# Patient Record
Sex: Female | Born: 1945 | Race: White | Hispanic: No | Marital: Married | Smoking: Never smoker
Health system: Southern US, Community
[De-identification: ages and names within clinical notes are randomized; demographics above are authoritative.]

## PROBLEM LIST (undated history)

## (undated) DIAGNOSIS — C4490 Unspecified malignant neoplasm of skin, unspecified: Secondary | ICD-10-CM

## (undated) DIAGNOSIS — E039 Hypothyroidism, unspecified: Secondary | ICD-10-CM

## (undated) DIAGNOSIS — E079 Disorder of thyroid, unspecified: Secondary | ICD-10-CM

## (undated) DIAGNOSIS — M1990 Unspecified osteoarthritis, unspecified site: Secondary | ICD-10-CM

## (undated) HISTORY — PX: KNEE ARTHROSCOPY: SUR90

## (undated) HISTORY — PX: CATARACT EXTRACTION W/ INTRAOCULAR LENS IMPLANT: SHX1309

## (undated) HISTORY — PX: OTHER SURGICAL HISTORY: SHX169

---

## 1997-08-04 ENCOUNTER — Other Ambulatory Visit: Admission: RE | Admit: 1997-08-04 | Discharge: 1997-08-04 | Payer: Self-pay | Admitting: Gynecology

## 1998-01-18 ENCOUNTER — Other Ambulatory Visit: Admission: RE | Admit: 1998-01-18 | Discharge: 1998-01-18 | Payer: Self-pay | Admitting: Gynecology

## 1998-07-12 ENCOUNTER — Other Ambulatory Visit: Admission: RE | Admit: 1998-07-12 | Discharge: 1998-07-12 | Payer: Self-pay | Admitting: Gynecology

## 1999-02-06 ENCOUNTER — Other Ambulatory Visit: Admission: RE | Admit: 1999-02-06 | Discharge: 1999-02-06 | Payer: Self-pay | Admitting: Gynecology

## 1999-07-31 ENCOUNTER — Other Ambulatory Visit: Admission: RE | Admit: 1999-07-31 | Discharge: 1999-07-31 | Payer: Self-pay

## 2000-04-29 ENCOUNTER — Ambulatory Visit (HOSPITAL_COMMUNITY): Admission: RE | Admit: 2000-04-29 | Discharge: 2000-04-29 | Payer: Self-pay | Admitting: Gastroenterology

## 2000-04-29 ENCOUNTER — Encounter (INDEPENDENT_AMBULATORY_CARE_PROVIDER_SITE_OTHER): Payer: Self-pay

## 2000-08-05 ENCOUNTER — Other Ambulatory Visit: Admission: RE | Admit: 2000-08-05 | Discharge: 2000-08-05 | Payer: Self-pay | Admitting: Gynecology

## 2001-08-05 ENCOUNTER — Other Ambulatory Visit: Admission: RE | Admit: 2001-08-05 | Discharge: 2001-08-05 | Payer: Self-pay | Admitting: Gynecology

## 2002-09-07 ENCOUNTER — Other Ambulatory Visit: Admission: RE | Admit: 2002-09-07 | Discharge: 2002-09-07 | Payer: Self-pay | Admitting: Gynecology

## 2003-06-28 ENCOUNTER — Ambulatory Visit (HOSPITAL_COMMUNITY): Admission: RE | Admit: 2003-06-28 | Discharge: 2003-06-28 | Payer: Self-pay | Admitting: Gastroenterology

## 2003-08-30 ENCOUNTER — Other Ambulatory Visit: Admission: RE | Admit: 2003-08-30 | Discharge: 2003-08-30 | Payer: Self-pay | Admitting: Gynecology

## 2004-08-28 ENCOUNTER — Other Ambulatory Visit: Admission: RE | Admit: 2004-08-28 | Discharge: 2004-08-28 | Payer: Self-pay | Admitting: Gynecology

## 2009-12-04 ENCOUNTER — Emergency Department (HOSPITAL_COMMUNITY): Admission: EM | Admit: 2009-12-04 | Discharge: 2009-12-04 | Payer: Self-pay | Admitting: Family Medicine

## 2010-06-02 NOTE — Op Note (Signed)
NAME:  Brandy Delgado, Brandy Delgado                           ACCOUNT NO.:  [id number]   MEDICAL RECORD NO.:  [id number]                   PATIENT TYPE:  AMB   LOCATION:  ENDO                                 FACILITY:  [facility]   PHYSICIAN:  John C. Madilyn Fireman, M.D.                 DATE OF BIRTH:  25-Jul-1945   DATE OF PROCEDURE:  06/28/2003  DATE OF DISCHARGE:                                 OPERATIVE REPORT   PROCEDURE:  Colonoscopy.   INDICATIONS FOR PROCEDURE:  Family history of colon cancer and personal  history of colon polyps.   DESCRIPTION OF PROCEDURE:  The patient was placed in the left lateral  decubitus position then placed on the pulse monitor with continuous low flow  oxygen delivered by nasal cannula. She was sedated with 62.5 mcg IV fentanyl  and 4 mg IV Versed. The Olympus video colonoscope was inserted into the  rectum and advanced to the cecum, confirmed by transillumination at  McBurney's point and visualization of the ileocecal valve and appendiceal  orifice. The prep was good. The cecum, ascending, transverse, descending and  sigmoid colon all appeared normal with no masses, polyps, diverticula or  other mucosal abnormalities. The rectum likewise appeared normal and  retroflexed view of the anus revealed no obvious internal hemorrhoids. The  scope was then withdrawn and the patient returned to the recovery room in  stable condition. The patient tolerated the procedure well and there were no  immediate complications.   IMPRESSION:  Normal colonoscopy.   PLAN:  Repeat colonoscopy in five years.                                               John C. Madilyn Fireman, M.D.    JCH/MEDQ  D:  06/28/2003  T:  06/28/2003  Job:  [id number]

## 2010-06-02 NOTE — Procedures (Signed)
[facility]  Patient:    Brandy Delgado, JUNEAU                          MRN: [id number] Proc. Date: 04/29/00 Attending:  Everardo All. Madilyn Fireman, M.D. CC:         Velora Heckler, M.D.   Procedure Report  PROCEDURE PERFORMED:  Colonoscopy.  ENDOSCOPIST:  Everardo All. Madilyn Fireman, M.D.  INDICATIONS FOR PROCEDURE:  Family history of colon cancer.  DESCRIPTION OF PROCEDURE:  The patient was placed in the left lateral decubitus position and placed on the pulse monitor with continuous low flow oxygen delivered by nasal cannula.  She was sedated with 80 mg IV Demerol and 8 mg IV Versed.  The Olympus video colonoscope was inserted into the rectum and advanced to the cecum, confirmed by transillumination of McBurneys point and visualization of the ileocecal valve and appendiceal orifice.  The prep was good.  The cecum appeared normal.  However, the ileocecal valve was somewhat prominent and had a slightly nodular appearance.  The terminal ileum was intubated and appeared normal.  I did take biopsies of the nodular area of the ileocecal valve.  The ascending colon appeared normal.  Within the transverse colon there was seen a small sessile polyp with central indentation.  This was fulgurated by hot biopsy.  The remainder of the transverse and descending colon appeared normal with no further abnormalities noted.  Within the sigmoid colon there was a 1 cm polyp which was snared and sent in a separate specimen container.  The remainder of the sigmoid and rectum appeared normal.  The colonoscope was then withdrawn and the patient returned to the recovery room in stable condition.  The patient tolerated the procedure well.  There were no immediate complications.  IMPRESSION: 1. Slight nodularity of the ileocecal valve of questionable significance. 2. Transverse and sigmoid colon polyps.  PLAN:  Await all biopsy results. DD:  04/29/00 TD:  04/29/00 Job: [id number]

## 2012-04-25 ENCOUNTER — Other Ambulatory Visit: Payer: Self-pay | Admitting: Orthopedic Surgery

## 2012-04-25 DIAGNOSIS — M25562 Pain in left knee: Secondary | ICD-10-CM

## 2012-05-06 ENCOUNTER — Ambulatory Visit
Admission: RE | Admit: 2012-05-06 | Discharge: 2012-05-06 | Disposition: A | Discharge status: Home / Self Care | Payer: BC Managed Care – PPO | Source: Ambulatory Visit | Attending: Orthopedic Surgery | Admitting: Orthopedic Surgery

## 2012-05-06 DIAGNOSIS — M25562 Pain in left knee: Secondary | ICD-10-CM

## 2013-02-11 ENCOUNTER — Encounter (HOSPITAL_COMMUNITY): Payer: Self-pay | Admitting: Emergency Medicine

## 2013-02-11 ENCOUNTER — Emergency Department (HOSPITAL_COMMUNITY): Payer: Medicare Other

## 2013-02-11 ENCOUNTER — Emergency Department (HOSPITAL_COMMUNITY)
Admission: EM | Admit: 2013-02-11 | Discharge: 2013-02-11 | Disposition: A | Discharge status: Home / Self Care | Payer: Medicare Other | Attending: Emergency Medicine | Admitting: Emergency Medicine

## 2013-02-11 DIAGNOSIS — Z862 Personal history of diseases of the blood and blood-forming organs and certain disorders involving the immune mechanism: Secondary | ICD-10-CM | POA: Insufficient documentation

## 2013-02-11 DIAGNOSIS — Z791 Long term (current) use of non-steroidal anti-inflammatories (NSAID): Secondary | ICD-10-CM | POA: Insufficient documentation

## 2013-02-11 DIAGNOSIS — M1710 Unilateral primary osteoarthritis, unspecified knee: Secondary | ICD-10-CM | POA: Insufficient documentation

## 2013-02-11 DIAGNOSIS — M25551 Pain in right hip: Secondary | ICD-10-CM

## 2013-02-11 DIAGNOSIS — M5430 Sciatica, unspecified side: Secondary | ICD-10-CM | POA: Insufficient documentation

## 2013-02-11 DIAGNOSIS — R110 Nausea: Secondary | ICD-10-CM | POA: Insufficient documentation

## 2013-02-11 DIAGNOSIS — M25559 Pain in unspecified hip: Secondary | ICD-10-CM | POA: Insufficient documentation

## 2013-02-11 DIAGNOSIS — IMO0002 Reserved for concepts with insufficient information to code with codable children: Secondary | ICD-10-CM

## 2013-02-11 DIAGNOSIS — Z8639 Personal history of other endocrine, nutritional and metabolic disease: Secondary | ICD-10-CM | POA: Insufficient documentation

## 2013-02-11 HISTORY — DX: Disorder of thyroid, unspecified: E07.9

## 2013-02-11 LAB — CBC
HCT: 43.1 % (ref 36.0–46.0)
Hemoglobin: 14.9 g/dL (ref 12.0–15.0)
MCH: 31 pg (ref 26.0–34.0)
MCHC: 34.6 g/dL (ref 30.0–36.0)
MCV: 89.8 fL (ref 78.0–100.0)
PLATELETS: 281 10*3/uL (ref 150–400)
RBC: 4.8 MIL/uL (ref 3.87–5.11)
RDW: 12.6 % (ref 11.5–15.5)
WBC: 13.1 10*3/uL — ABNORMAL HIGH (ref 4.0–10.5)

## 2013-02-11 LAB — BASIC METABOLIC PANEL
BUN: 17 mg/dL (ref 6–23)
CALCIUM: 9.1 mg/dL (ref 8.4–10.5)
CO2: 25 mEq/L (ref 19–32)
Chloride: 99 mEq/L (ref 96–112)
Creatinine, Ser: 0.75 mg/dL (ref 0.50–1.10)
GFR, EST NON AFRICAN AMERICAN: 87 mL/min — AB (ref >90)
GLUCOSE: 140 mg/dL — AB (ref 70–99)
Potassium: 3.9 mEq/L (ref 3.7–5.3)
Sodium: 138 mEq/L (ref 137–147)

## 2013-02-11 LAB — SEDIMENTATION RATE: SED RATE: 20 mm/h (ref 0–22)

## 2013-02-11 MED ORDER — MORPHINE SULFATE 4 MG/ML IJ SOLN
4.0000 mg | Freq: Once | INTRAMUSCULAR | Status: DC
  Filled 2013-02-11: qty 1

## 2013-02-11 MED ORDER — OXYCODONE-ACETAMINOPHEN 5-325 MG PO TABS: 1.0000 | ORAL_TABLET | Freq: Four times a day (QID) | ORAL | Status: DC | PRN

## 2013-02-11 MED ORDER — HYDROMORPHONE HCL PF 1 MG/ML IJ SOLN
1.0000 mg | Freq: Once | INTRAMUSCULAR | Status: AC
  Administered 2013-02-11: 1 mg via INTRAVENOUS
  Filled 2013-02-11: qty 1

## 2013-02-11 MED ORDER — ONDANSETRON HCL 4 MG/2ML IJ SOLN
4.0000 mg | Freq: Once | INTRAMUSCULAR | Status: AC
  Administered 2013-02-11: 4 mg via INTRAVENOUS
  Filled 2013-02-11: qty 2

## 2013-02-11 MED ORDER — CYCLOBENZAPRINE HCL 5 MG PO TABS: 5.0000 mg | ORAL_TABLET | Freq: Three times a day (TID) | ORAL | Status: DC | PRN

## 2013-02-11 MED ORDER — DIAZEPAM 5 MG PO TABS
5.0000 mg | ORAL_TABLET | Freq: Once | ORAL | Status: AC
  Administered 2013-02-11: 5 mg via ORAL
  Filled 2013-02-11: qty 1

## 2013-02-11 MED ORDER — NAPROXEN 500 MG PO TABS: 500.0000 mg | ORAL_TABLET | Freq: Two times a day (BID) | ORAL | Status: DC

## 2013-02-11 NOTE — ED Notes (Signed)
assisted patient with the bedpan

## 2013-02-11 NOTE — ED Notes (Addendum)
Pt given 0.5 mg of Dilaudid due to mix up of patient, EDP made aware of situation.

## 2013-02-11 NOTE — ED Notes (Signed)
Per EMS: Pt started having rt hip pain w/ radiation down rt leg.  States that she has been dry heaving from the pain.

## 2013-02-11 NOTE — Discharge Instructions (Signed)
Return to the ED with any concerns including weakness in legs, not able to urinate, loss of control of bowel or bladder, fever/chills, decreased level of alertness/lethargy, or any other alarming symptoms   Emergency Department Resource Guide 1) Find a Doctor and Pay Out of Pocket Although you won't have to find out who is covered by your insurance plan, it is a good idea to ask around and get recommendations. You will then need to call the office and see if the doctor you have chosen will accept you as a new patient and what types of options they offer for patients who are self-pay. Some doctors offer discounts or will set up payment plans for their patients who do not have insurance, but you will need to ask so you aren't surprised when you get to your appointment.  2) Contact Your Local Health Department Not all health departments have doctors that can see patients for sick visits, but many do, so it is worth a call to see if yours does. If you don't know where your local health department is, you can check in your phone book. The CDC also has a tool to help you locate your state's health department, and many state websites also have listings of all of their local health departments.  3) Find a [facility] If your illness is not likely to be very severe or complicated, you may want to try a walk in clinic. These are popping up all over the country in pharmacies, drugstores, and shopping centers. They're usually staffed by nurse practitioners or physician assistants that have been trained to treat common illnesses and complaints. They're usually fairly quick and inexpensive. However, if you have serious medical issues or chronic medical problems, these are probably not your best option.  No Primary Care Doctor: - Call Health Connect at  [phone] - they can help you locate a primary care doctor that  accepts your insurance, provides certain services, etc. - Physician Referral Service-  [phone]  Chronic Pain Problems: Organization         Address  Phone   Notes  [facility]  [phone] Patients need to be referred by their primary care doctor.   Medication Assistance: Organization         Address  Phone   Notes  [facility] Medication [facility] [address] [place], [place] [address] [phone] --Must be a resident of [facility] -- Must have NO insurance coverage whatsoever (no Medicaid/ Medicare, etc.) -- The pt. MUST have a primary care doctor that directs their care regularly and follows them in the community   MedAssist  [phone]   [employer]  [phone]    Agencies that provide inexpensive medical care: Organization         Address  Phone   Notes  [place]  [phone]   [facility]    [phone]   [place], [place] [address] [phone]   [place]y [address], [place] [phone]   Planned Parenthood    [phone]   [facility]    [phone]   [place] [address], [place] Phone:  [phone], Fax:  [phone] Hours of Operation:  9 am - 6 pm, M-F.  Also accepts Medicaid/Medicare and self-pay.  [facility] for Children  [address], [address], [place] Phone: [phone], Fax: [phone]. Hours of Operation:  8:30 am - 5:30 pm, M-F.  Also accepts Medicaid and self-pay.  [facility] [address], [place] Phone: [phone]   [place], [place], [place] [phone], Ext. [phone] Mondays & Thursdays: 7-9 AM.  First 15 patients are seen on a first come, first serve basis.    [place] Providers:  Organization         Address  Phone   Notes  [facility] [address],  [place] [phone] Also accepts self-pay patients.  [facility] [address], [place]  [phone]   [place], [address], [place] [phone]   [facility] [address] [place] [phone]   Lucianne Lei [address], [place]   [phone] Only accepts [place] Access [place] patients after they have their name applied to their card.   Self-Pay (no insurance) in [facility]:  Organization         Address  Phone   Notes  Sickle Cell Patients, [facility] [place] [phone]   [facility] [place] [phone]   [facility] [place]  [place], [place], [place] [phone]   [facility]/Dr. Osei-Bonsu  [address], [place] or [address], [place] [phone] Phone number for both [place] and [place] locations is the same.  Urgent Medical and [facility] [address], [place] [phone]   [facility] [address] [place] or [address] [phone]   [facility] [address] [place] [phone], phone; [phone], fax Sees patients 1st and 3rd Saturday of every month.  Must not qualify for public or private insurance (i.e. Medicaid, Medicare, [place] Health Choice, Veterans' Benefits)  Household income should be no more than 200% of the poverty level The clinic cannot treat you if you are pregnant or think you are pregnant  Sexually transmitted diseases are not treated at the clinic.    Dental Care: Organization         Address  Phone  Notes  [facility] Department of [facility] [place] [phone] Accepts children up to age 50 who are enrolled in  [place] or [place]; pregnant women with a Medicaid card; and children who have applied for Medicaid or [place] Health Choice, but were declined, whose parents can pay a reduced fee at time of service.  [facility] Department of [facility]  [address], [place] [phone] Accepts children up to age 26 who are enrolled in [place] or [place]; pregnant women with a Medicaid card; and children who have applied for Medicaid or [place] Health Choice, but were declined, whose parents can pay a reduced fee at time of service.  [place] Adult Dental Access PROGRAM  [place] [phone] Patients are seen by appointment only. Walk-ins are not accepted. [place] will see patients 70 years of age and older. Monday - Tuesday (8am-5pm) Most Wednesdays (8:30-5pm) $30 per visit, cash only  [place] Adult  Dental Access PROGRAM  [address], [facility] [phone] Patients are seen by appointment only. Walk-ins are not accepted. [place] will see patients 42 years of age and older. One Wednesday Evening (Monthly: Volunteer Based).  $30 per visit, cash only  [place]  [phone] for adults; Children under age 54, call Graduate Pediatric Dentistry at [phone]. Children aged 65-14, please call [phone] to request a pediatric application.  Dental services are provided in all areas of dental care including fillings, crowns and bridges, complete and partial dentures, implants, gum treatment, root canals, and extractions. Preventive care is also provided. Treatment is provided to both adults and children. Patients are selected via a lottery and there is often a waiting list.   [facility] [address] [place]  [phone] [email or web address]   [facility] [address] [place], [place] [phone], Ext. [phone] Second and Fourth Thursday of each month, opens at 6:30  AM; Clinic ends at 9 AM.  Patients are seen on a first-come first-served basis, and a limited number are seen during each clinic.   [facility]  [address] Hillard Danker [place], [place] [phone]   Eligibility Requirements You must have lived in [place], [place], or [place] counties for at least the last three months.   You cannot be eligible for state or federal sponsored [employer], including [employer], [place], or [employer].   You generally cannot be eligible for healthcare insurance through your employer.    How to apply: Eligibility screenings are held every Tuesday and Wednesday afternoon from 1:00 pm until 4:00 pm. You do not need an appointment for the interview!  [facility] [address] [place], [place]   [place]  [place] Department  [place]  [phone]    Behavioral Health Resources in the Community: Intensive Outpatient Programs Organization         Address  Phone  Notes  [place]. [address], [place], [place] [phone]   [facility] Outpatient [address], [place], [place]   ADS: Alcohol & Drug Svcs [address] [place], [place]   [place] [address]  [place], [place] or [phone]   Substance Abuse Resources Organization         Address  Phone  Notes  Alcohol and Drug Services  [phone]   [place]  [phone]   [facility] [place]   Chinita Pester  [phone]   Residential & Outpatient Substance Abuse Program  [phone]   Psychological Services Organization         Address  Phone  Notes  [facility] [place]  [place]  [phone]   [place] [address], [place] [phone] or  [phone]    Mobile Crisis Teams Organization         Address  Phone  Notes  [facility], Mobile Crisis Care Unit  [phone]   Assertive Psychotherapeutic Services  [address] [place], [place]   [place] Levels [address] [place] [phone]    Self-Help/Support Groups Organization         Address  Phone             Notes  Mental  Health Assoc. of [place] - variety of support groups  [place] Call for more information  Narcotics Anonymous (NA), Caring Services [address], [employer] [place]  2 meetings at this location   Special educational needs teacher         Address  Phone  Notes  ASAP Residential Treatment [place],    [place]  [phone]   [facility]  [address] [place] [address], [place], [place]   [place]ell[place], [place] [phone] Admissions: 8am-3pm M-F  Incentives Substance [place] [address]    [place], [place] [phone]   [facility] [address] [place], [place], [place]   [facility] [address],  [place], [place]   Insight Programs - Intensive Outpatient [address] Kristeen [address], [place], [place]   [facility] [address]  [place], [place] [phone] or [phone]   [facility]idential Treatment Services (RTS) [address], [place], [place] Accepts Medicaid  [facility] [address]  [place] [phone] Substance Abuse/Addiction Treatment   [facility] Organization         Address  Phone  Notes  [facility]  [phone]   Domenic Schwab, PhD [address] Arlis Porta [place], [place]   [phone] or [phone]   [facility] [place], [place] [phone]   [facility] [address],  [place], [place] [phone] Insurance/Medicaid/sponsorship through [facility] and Families [address], [address]                                    [place], [place] [phone] [place] [address], [place] [phone]    Dr. Adele Schilder  [phone]   [facility] Dept. 1) [address] [place] 2) [place] 3)  [address], [place] [phone]  [phone]   [place] [phone] or [phone] (After Hours)

## 2013-02-11 NOTE — ED Provider Notes (Signed)
CSN: [id number]     Arrival date & time 02/11/13  0750 History   First MD Initiated Contact with Patient 02/11/13 [phone]     Chief Complaint  Patient presents with  . Hip Pain  . Nausea   (Consider location/radiation/quality/duration/timing/severity/associated sxs/prior Treatment) HPI Pt presenting with c/o right hip pain radiating to right thigh.  She states pain began this morning when she woke up and has been gradually worsening.  No known trauma or new activities.  Has not been able to bear weight on her hip.  Denies abdominal pain, no denies back pain.  States she has been dry heaving due to the intensity of pain.  Pain is constant patient describes this as burning and aching pain, radiates around to front of thigh.. .  Has hx of arthritis in knees but has not had pain in hip or low back similar to this in the past. No treatment prior to arrival.  There are no other associated systemic symptoms, there are no other alleviating or modifying factors.   Past Medical History  Diagnosis Date  . Thyroid disease    History reviewed. No pertinent past surgical history. History reviewed. No pertinent family history. History  Substance Use Topics  . Smoking status: Never Smoker   . Smokeless tobacco: Not on file  . Alcohol Use: No   OB History   Grav Para Term Preterm Abortions TAB SAB Ect Mult Living                 Review of Systems ROS reviewed and all otherwise negative except for mentioned in HPI  Allergies  Ampicillin  Home Medications   Current Outpatient Rx  Name  Route  Sig  Dispense  Refill  . ibuprofen (ADVIL,MOTRIN) 200 MG tablet   Oral   Take 400 mg by mouth every 6 (six) hours as needed.         . naproxen sodium (ANAPROX) 220 MG tablet   Oral   Take 440 mg by mouth 2 (two) times daily with a meal.         . cyclobenzaprine (FLEXERIL) 5 MG tablet   Oral   Take 1 tablet (5 mg total) by mouth 3 (three) times daily as needed for muscle spasms.   20 tablet    0   . naproxen (NAPROSYN) 500 MG tablet   Oral   Take 1 tablet (500 mg total) by mouth 2 (two) times daily.   30 tablet   0   . oxyCODONE-acetaminophen (PERCOCET/ROXICET) 5-325 MG per tablet   Oral   Take 1-2 tablets by mouth every 6 (six) hours as needed for severe pain.   20 tablet   0    BP 166/77  Pulse 108  Temp(Src) 98.3 F (36.8 C) (Oral)  Resp 22  SpO2 100% Vitals reviewed Physical Exam Physical Examination: General appearance - alert, well appearing, and in no distress Mental status - alert, oriented to person, place, and time Eyes - no conjunctival injection, no scleral icterus Mouth - mucous membranes moist, pharynx normal without lesions Chest - clear to auscultation, no wheezes, rales or rhonchi, symmetric air entry Heart - normal rate, regular rhythm, normal S1, S2, no murmurs, rubs, clicks or gallops Abdomen - soft, nontender, nondistended, no masses or organomegaly Back exam - no midline tenderness to palpation of mildine, mild right sided paraspinous tenderness Neurological - alert, oriented, normal speech, strength 5/5 in extremities x 4, sensation intact distally, no saddle anesthesia Extremities - peripheral  pulses normal, no pedal edema, no clubbing or cyanosis Skin - normal coloration and turgor, no rashes  ED Course  Procedures (including critical care time) Labs Review Labs Reviewed  CBC - Abnormal; Notable for the following:    WBC 13.1 (*)    All other components within normal limits  BASIC METABOLIC PANEL - Abnormal; Notable for the following:    Glucose, Bld 140 (*)    GFR calc non Af Amer 87 (*)    All other components within normal limits  SEDIMENTATION RATE   Imaging Review Dg Lumbar Spine Complete  02/11/2013   CLINICAL DATA:  Acute onset of right hip pain.  EXAM: LUMBAR SPINE - COMPLETE 4+ VIEW  COMPARISON:  None.  FINDINGS: There is a slight rotoscoliosis of the lumbar spine. There is degenerative disc disease at L1-2 through L3-4  with degenerative changes of the endplates. Moderate facet arthritis at L4-5 bilaterally and at L5-S1 on the right. There is no fracture or bone destruction. No spondylolisthesis or spondylolysis.  IMPRESSION: No acute abnormalities. Multilevel degenerative disc and joint disease.   Electronically Signed   By: Rozetta Nunnery M.D.   On: 02/11/2013 10:04   Dg Hip Complete Right  02/11/2013   CLINICAL DATA:  Right hip pain.  EXAM: RIGHT HIP - COMPLETE 2+ VIEW  COMPARISON:  None.  FINDINGS: There is no evidence of hip fracture or dislocation. There is no evidence of arthropathy or other focal bone abnormality.  IMPRESSION: Normal exam.   Electronically Signed   By: Rozetta Nunnery M.D.   On: 02/11/2013 10:02   Ct Pelvis Wo Contrast  02/11/2013   CLINICAL DATA:  Right hip pain and nausea. Pain extends down the right leg.  EXAM: CT PELVIS WITHOUT CONTRAST  TECHNIQUE: Multidetector CT imaging of the pelvis was performed following the standard protocol without intravenous contrast.  COMPARISON:  None.  FINDINGS: There are minimal degenerative changes of the hips bilaterally. No joint effusions. No fracture or avascular necrosis. The soft tissues appear normal. The patient has fairly severe arthritis of the facet joints at L4-5 and L5-S1.  The visualized bowel is normal. Uterus and ovaries and bladder appear normal.  IMPRESSION: Minimal degenerative changes of both hips.   Electronically Signed   By: Rozetta Nunnery M.D.   On: 02/11/2013 11:58    EKG Interpretation   None       MDM   1. Right hip pain   2. Sciatica    Pt presenting with pain in right buttock and hip radiating to right thigh.  On xrays lumbar spine has some degenerative arthritis.  Pt with significant pain after first round of meds, Ct pelvis ordered to further evaluate and this was normal.  Pt has no weakness of legs, no incontinence of bowel or bladder, no urinary retention, no fever.  Low suspicion for cauda equina, no occult fractures on CT  scan.  No preceding illness or fever to suggest septic joint, no overlying erythema or warmth.  On multiple rechecks patient is feeling somewhat improved, states pain is burning in nature radiating to right thigh- suspect sciatica.  Discussed discharge and patient would like to f/u with Dr. Sharol Given will d/c with pain medication, anti-inflammatory and muscle relaxer.  Discharged with strict return precautions.  Pt agreeable with plan.    Threasa Beards, MD 02/11/13 [phone]

## 2013-02-11 NOTE — ED Notes (Signed)
Bed: WA17 Expected date:  Expected time:  Means of arrival:  Comments: Hip pain, nausea

## 2013-03-04 ENCOUNTER — Ambulatory Visit
Admission: RE | Admit: 2013-03-04 | Discharge: 2013-03-04 | Disposition: A | Discharge status: Home / Self Care | Payer: Medicare Other | Source: Ambulatory Visit | Attending: Orthopedic Surgery | Admitting: Orthopedic Surgery

## 2013-03-04 ENCOUNTER — Other Ambulatory Visit: Payer: Self-pay | Admitting: Orthopedic Surgery

## 2013-03-04 DIAGNOSIS — M79659 Pain in unspecified thigh: Secondary | ICD-10-CM

## 2014-10-01 ENCOUNTER — Other Ambulatory Visit: Payer: Self-pay | Admitting: Orthopedic Surgery

## 2014-10-01 DIAGNOSIS — M25511 Pain in right shoulder: Secondary | ICD-10-CM

## 2014-10-10 ENCOUNTER — Ambulatory Visit
Admission: RE | Admit: 2014-10-10 | Discharge: 2014-10-10 | Disposition: A | Discharge status: Home / Self Care | Payer: Medicare Other | Source: Ambulatory Visit | Attending: Orthopedic Surgery | Admitting: Orthopedic Surgery

## 2014-10-10 DIAGNOSIS — M25511 Pain in right shoulder: Secondary | ICD-10-CM

## 2014-11-10 ENCOUNTER — Ambulatory Visit: Payer: Medicare Other | Attending: Orthopedic Surgery | Admitting: Physical Therapy

## 2014-11-10 DIAGNOSIS — M25511 Pain in right shoulder: Secondary | ICD-10-CM | POA: Insufficient documentation

## 2014-11-10 DIAGNOSIS — R293 Abnormal posture: Secondary | ICD-10-CM | POA: Diagnosis present

## 2014-11-10 DIAGNOSIS — M25611 Stiffness of right shoulder, not elsewhere classified: Secondary | ICD-10-CM

## 2014-11-10 DIAGNOSIS — R29898 Other symptoms and signs involving the musculoskeletal system: Secondary | ICD-10-CM | POA: Diagnosis present

## 2014-11-10 NOTE — Patient Instructions (Signed)
   Cylas Falzone PT, DPT, LAT, ATC  [facility] Phone: [phone]     

## 2014-11-10 NOTE — Therapy (Signed)
[place], [place], [address] Phone: [phone]   Fax:  [phone]  Physical Therapy Evaluation  Patient Details  Name: Brandy Delgado MRN: [id number] Date of Birth: December 09, 1945 Referring Provider: Dr Madlyn Frankel  Encounter Date: 11/10/2014      PT End of Session - 11/10/14 1137    Visit Number 1   Number of Visits 12   Date for PT Re-Evaluation 12/22/14   Authorization Type Medicare: Kx mod by 15th visit, progress note by 9th visit   PT Start Time 0930   PT Stop Time 1015   PT Time Calculation (min) 45 min   Activity Tolerance Patient tolerated treatment well   Behavior During Therapy [facility] for tasks assessed/performed      Past Medical History  Diagnosis Date  . Thyroid disease     No past surgical history on file.  There were no vitals filed for this visit.  Visit Diagnosis:  Right arm weakness - Plan: PT plan of care cert/re-cert  Abnormal posture - Plan: PT plan of care cert/re-cert  Decreased ROM of right shoulder - Plan: PT plan of care cert/re-cert      Subjective Assessment - 11/10/14 0943    Subjective pt is a 69 y.o F s/p R shoulder RC debridement on 10/26/2014 due to pain and diffiuclty on 09/26/2014 in the shoulder. Since the surgery she reports that she is doing well and hasn't had much pain in the shoulder   Limitations Lifting   How long can you sit comfortably? hours   How long can you stand comfortably? 1-2 hours at time   How long can you walk comfortably? 1-2 hours at time   Diagnostic tests MRI on 10/10/2014 partial thickness tear of the rotator cuff   Patient Stated Goals to get it back to normal   Currently in Pain? Yes  last took tylenol a 8am   Pain Score 1    Pain Location Shoulder   Pain Orientation Right   Pain Descriptors / Indicators Sore;Tingling   Pain Type Surgical pain   Pain Radiating Towards tingling into the R hand   Pain Onset More than a month ago    Pain Frequency Constant   Aggravating Factors  lifting the arm   Pain Relieving Factors tylenol            [facility] PT Assessment - 11/10/14 0950    Assessment   Medical Diagnosis s/p R rotator cuff debridement   Referring Provider Dr Madlyn Frankel   Onset Date/Surgical Date 10/26/14   Hand Dominance Right   Next MD Visit 12/07/2014   Prior Therapy no   Precautions   Precautions None   Restrictions   Weight Bearing Restrictions No   Balance Screen   Has the patient fallen in the past 6 months No   Has the patient had a decrease in activity level because of a fear of falling?  No   Is the patient reluctant to leave their home because of a fear of falling?  No   Home Environment   Living Environment Private residence   Living Arrangements Alone   Type of [place] to enter   Entrance Stairs-Number of Steps 3   Entrance Stairs-Rails Right   Home Layout One level   Prior Function   Level of Independence Independent;Independent with basic ADLs   Vocation Part time employment  hair stylist   Vocation Requirements prolonged standing,  walking, cutting hair, lifting and carrying   Leisure gambling, beach, walking   Cognition   Overall Cognitive Status Within Functional Limits for tasks assessed   Observation/Other Assessments   Observations incision appears intact, clean, and healing well   Focus on Therapeutic Outcomes (FOTO)  60% limited  predicted 33% limited   Posture/Postural Control   Posture/Postural Control Postural limitations   Postural Limitations Rounded Shoulders;Forward head   ROM / Strength   AROM / PROM / Strength AROM;PROM;Strength   AROM   AROM Assessment Site Shoulder   Right/Left Shoulder Right;Left   Right Shoulder Extension 55 Degrees   Right Shoulder Flexion 25 Degrees  noted pressure with movement   Right Shoulder ABduction 30 Degrees  with signicant shoulder hiking   Right Shoulder Internal Rotation 80 Degrees   Right  Shoulder External Rotation 55 Degrees   Left Shoulder Extension 55 Degrees   Left Shoulder Flexion 170 Degrees   Left Shoulder ABduction 166 Degrees   Left Shoulder Internal Rotation 88 Degrees   Left Shoulder External Rotation 80 Degrees   PROM   Overall PROM  Within functional limits for tasks performed;Other (comment)   Overall PROM Comments pain at endrange of abduction and external rotation   PROM Assessment Site Shoulder   Right/Left Shoulder Right   Strength   Overall Strength Comments only mild pain in the lateral humerus on the R during movement   Strength Assessment Site Shoulder   Right/Left Shoulder Right;Left   Right Shoulder Flexion 2/5   Right Shoulder Extension 2/5   Right Shoulder ABduction 2/5   Right Shoulder Internal Rotation 2/5   Right Shoulder External Rotation 2/5   Left Shoulder Flexion 4+/5   Left Shoulder Extension 4+/5   Left Shoulder ABduction 4+/5   Left Shoulder Internal Rotation 4+/5   Left Shoulder External Rotation 4+/5   Palpation   Palpation comment tendnerness located at the middle deltoid and proxmial lateral humerus                           PT Education - 11/10/14 1136    Education provided Yes   Education Details evaluation findings, POC, Goals, HEP   Person(s) Educated Patient   Methods Explanation   Comprehension Verbalized understanding          PT Short Term Goals - 11/10/14 1142    PT SHORT TERM GOAL #1   Title pt will be I with inital HEP ( 12/01/2014)   Time 3   Period Weeks   Status New   PT SHORT TERM GOAL #2   Title pt will be able to verbalize and demostrate techniques to reduce R shoulder reinjury via postural awarenss,lifting and carrying mechanics, and HEP (12/01/2014)   Time 3   Period Weeks   Status New           PT Long Term Goals - 11/10/14 1143    PT LONG TERM GOAL #1   Title pt will be i with all HEP given throughout therapy (12/22/2014)   Time 6   Period Weeks   Status New    PT LONG TERM GOAL #2   Title she will demonstrate improve shoulder AROM by > 20 degrees with flexion/ abduction to help with job related activities (12/22/2014)   Time 6   Period Weeks   Status New   PT LONG TERM GOAL #3   Title she will increase R shoulder strength to > 4/5  in all planes to assist with ADLs (12/22/2014)   Time 6   Period Weeks   Status New   PT LONG TERM GOAL #4   Title she will be able to lift and carry > 10# overhead with < 2/10 pain to promote lifting associated with job related activities (12/22/2014)   Time 6   Period Weeks   Status New   PT LONG TERM GOAL #5   Title pt will increase her FOTO score to > 60 to demonstrate improved function at discharge (12/22/2014)   Time 6   Period Weeks   Status New               Plan - 11-12-14 1137    Clinical Impression Statement Tahtiana present to OPPT s/p R shoulder rotator cuff debridement on 10/26/2014. she demosntates limited AROM secondary to weakness, and functional PROM with mild pain at end range. Strength she is able to get 3-/5 with mild pain noted in the proximal lateral  humerus. She exhibits increased foreward head posture and anteriorly rolled shoulders, with tenderness located along the middle deltoid and proximal lateral humerus. she would benefit from physical therapy to increase strength and AROM and return to PLOF by addressing the impairments listed.    Pt will benefit from skilled therapeutic intervention in order to improve on the following deficits Improper body mechanics;Pain;Postural dysfunction;Decreased strength;Decreased mobility;Hypomobility;Decreased activity tolerance;Decreased endurance;Impaired UE functional use   Rehab Potential Good   PT Frequency 2x / week   PT Duration 6 weeks   PT Treatment/Interventions ADLs/Self Care Home Management;Cryotherapy;Electrical Stimulation;Moist Heat;Ultrasound;Therapeutic activities;Therapeutic exercise;Manual techniques;Taping;Passive range of  motion;Patient/family education   PT Next Visit Plan assess response to HEP, shoulder strengthening, posture eduction    PT Home Exercise Plan wall walks flexion/ abduction with controlled descent, isometric IR/ER flexion/ extension    Consulted and Agree with Plan of Care Patient          G-Codes - 11-12-14 1146    Functional Assessment Tool Used FOTO 60% limited   Functional Limitation Carrying, moving and handling objects   Carrying, Moving and Handling Objects Current Status (L8921) At least 60 percent but less than 80 percent impaired, limited or restricted   Carrying, Moving and Handling Objects Goal Status (J9417) At least 20 percent but less than 40 percent impaired, limited or restricted       Problem List There are no active problems to display for this patient.  [name] PT, DPT, LAT, ATC  11/12/2014  12:36 PM    [facility] [address] [place], [place], [address] Phone: [phone]   Fax:  [phone]  Name: TACORI KVAMME MRN: [id number] Date of Birth: 03/18/1945

## 2014-11-12 ENCOUNTER — Ambulatory Visit: Payer: Medicare Other | Admitting: Physical Therapy

## 2014-11-12 DIAGNOSIS — M25611 Stiffness of right shoulder, not elsewhere classified: Secondary | ICD-10-CM

## 2014-11-12 DIAGNOSIS — R29898 Other symptoms and signs involving the musculoskeletal system: Secondary | ICD-10-CM

## 2014-11-12 DIAGNOSIS — R293 Abnormal posture: Secondary | ICD-10-CM

## 2014-11-12 NOTE — Patient Instructions (Signed)
EXTENSION: Standing - Resistance Band: Stable (Active)   Stand, right arm at side. Against yellow resistance band, draw arm backward, as far as possible, keeping elbow straight. Complete __2_ sets of __15_ repetitions. Perform _2__ sessions per day.   http://ss.exer.us/290   Copyright  VHI. All rights reserved.  Resistive Band Rowing   With resistive band anchored in door, grasp both ends. Keeping elbows bent, pull back, squeezing shoulder blades together. Hold _5___ seconds. Repeat ___15_ times. Do _2___ sessions per day.  http://gt2.exer.us/97   Copyright  VHI. All rights reserved.

## 2014-11-12 NOTE — Therapy (Signed)
[place], [place], [address] Phone: [phone]   Fax:  [phone]  Physical Therapy Treatment  Patient Details  Name: Brandy Delgado MRN: [id number] Date of Birth: [birth year] Referring Provider: Dr Madlyn Frankel  Encounter Date: 11/12/2014      PT End of Session - 11/12/14 0904    Visit Number 2   Number of Visits 12   Date for PT Re-Evaluation 12/22/14   PT Start Time 0800   PT Stop Time 0845   PT Time Calculation (min) 45 min   Activity Tolerance Patient tolerated treatment well;No increased pain      Past Medical History  Diagnosis Date  . Thyroid disease     No past surgical history on file.  There were no vitals filed for this visit.  Visit Diagnosis:  Right arm weakness  Abnormal posture  Decreased ROM of right shoulder      Subjective Assessment - 11/12/14 0900    Subjective Patient reported 3/10 pain in proximal RUE upon arrival and reported no pain after treatment. "I was able to turn my steering wheel yesterday" "I'm having trouble reaching things and can't keep my shoulders down on my exercises"            [facility] PT Assessment - 11/12/14 0911    AROM   Right Shoulder Flexion 24 Degrees   Right Shoulder ABduction 37 Degrees                     [facility] Adult PT Treatment/Exercise - 11/12/14 0913    Shoulder Exercises: Supine   Other Supine Exercises serratus punches 15 reps 1#   Other Supine Exercises AAROM with cane flexion/ horizontal abd/ horizontal add/ 15 reps for each   Shoulder Exercises: Standing   Other Standing Exercises wall walks AAROM adbuction and flexion 20 reps for each   Shoulder Exercises: Pulleys   Flexion 3 minutes   ABduction 3 minutes   Other Pulley Exercises scaption 3 minutes   Shoulder Exercises: ROM/Strengthening   Other ROM/Strengthening Exercises rows with yellow TB x15; shoulder extension with yellow TB x10   Shoulder  Exercises: Isometric Strengthening   Flexion 5X10"   Flexion Limitations 2 sets   Extension 5X10"   Extension Limitations 2 sets   External Rotation 5X10"   External Rotation Limitations 2 sets   Internal Rotation 5X10"   Internal Rotation Limitations 2 sets                PT Education - 11/12/14 0846    Education provided Yes   Education Details yellow band rows and extension   Person(s) Educated Patient   Methods Explanation;Handout   Comprehension Verbalized understanding          PT Short Term Goals - 11/10/14 1142    PT SHORT TERM GOAL #1   Title pt will be I with inital HEP ( 12/01/2014)   Time 3   Period Weeks   Status New   PT SHORT TERM GOAL #2   Title pt will be able to verbalize and demostrate techniques to reduce R shoulder reinjury via postural awarenss,lifting and carrying mechanics, and HEP (12/01/2014)   Time 3   Period Weeks   Status New           PT Long Term Goals - 11/10/14 1143    PT LONG TERM GOAL #1   Title pt will be i with all HEP given throughout therapy (  12/22/2014)   Time 6   Period Weeks   Status New   PT LONG TERM GOAL #2   Title she will demonstrate improve shoulder AROM by > 20 degrees with flexion/ abduction to help with job related activities (12/22/2014)   Time 6   Period Weeks   Status New   PT LONG TERM GOAL #3   Title she will increase R shoulder strength to > 4/5 in all planes to assist with ADLs (12/22/2014)   Time 6   Period Weeks   Status New   PT LONG TERM GOAL #4   Title she will be able to lift and carry > 10# overhead with < 2/10 pain to promote lifting associated with job related activities (12/22/2014)   Time 6   Period Weeks   Status New   PT LONG TERM GOAL #5   Title pt will increase her FOTO score to > 60 to demonstrate improved function at discharge (12/22/2014)   Time [place] - 11/12/14 0905    Clinical Impression Statement Patient presented as  frustrated and impatient because she could not do her exercises without her shoulder raising, and didn't understand some of the HEP. HEP was gone over again this treatment and she both verbalized understanding of and demonstrated the exercises correctly. Wall walks were modified to slide up with help from the LUE AAROM with success and no shoulder raise. TB rows and ext were added to her HEP (see chart)   PT Next Visit Plan assess response to HEP, shoulder strengthening, posture eduction      During this treatment session, the therapist was present, participating in and directing the treatment.   Problem List There are no active problems to display for this patient.  Hessie Diener, PTA 11/12/2014 9:34 AM Phone: [phone] Fax: [phone]  [name], [place] 11/12/2014 9:34 AM PHONE:[phone] [place], [place], [address] Phone: [phone]   Fax:  [phone]  Name: Brandy Delgado MRN: [id number] Date of Birth: 09-14-1945

## 2014-11-17 ENCOUNTER — Ambulatory Visit: Payer: Medicare Other | Attending: Orthopedic Surgery | Admitting: Physical Therapy

## 2014-11-17 DIAGNOSIS — M25511 Pain in right shoulder: Secondary | ICD-10-CM | POA: Insufficient documentation

## 2014-11-17 DIAGNOSIS — M25611 Stiffness of right shoulder, not elsewhere classified: Secondary | ICD-10-CM

## 2014-11-17 DIAGNOSIS — R29898 Other symptoms and signs involving the musculoskeletal system: Secondary | ICD-10-CM | POA: Insufficient documentation

## 2014-11-17 DIAGNOSIS — R293 Abnormal posture: Secondary | ICD-10-CM | POA: Diagnosis present

## 2014-11-17 NOTE — Patient Instructions (Signed)
Progressive Resisted: External Rotation (Side-Lying)    Holding ____ pound weight, towel under arm, raise right forearm toward ceiling. Keep elbow bent and at side. Repeat ____ times per set. Do ____ sets per session. Do ____ sessions per day.  http://orth.exer.us/879   Copyright  VHI. All rights reserved.  Flexion - Supine (Dumbbell)    Lie with arms along body. Lift arms and shoulders straight up, palms forward. Repeat _20___ times per set. Do __2__ sets per session. Do _7___ sessions per week. Use ___0-1_ lb weights.   Copyright  VHI. All rights reserved.  Abduction (Eccentric) - Side-Lying    Lie on side, affected arm on top. Lift arm overhead. Slowly lower for 3-5 seconds. _10-20__ reps per set, _2__ sets per day, _7__ days per week.   Flexion (Eccentric) - Active-Assist (Cane)    Use unaffected arm to push affected arm forward. Avoid hiking shoulder. Keep palm relaxed. Slowly lower affected arm for 3-5 seconds, increasing use of affected arm. __10-20_ reps per set, _2__ sets per day, _7__ days per week.  http://ecce.exer.us/153   Copyright  VHI. All rights reserved.

## 2014-11-17 NOTE — Therapy (Addendum)
[place], [place], [address] Phone: [phone]   Fax:  [phone]  Physical Therapy Treatment  Patient Details  Name: Brandy Delgado MRN: [id number] Date of Birth: 1945/02/25 Referring Provider: Dr Madlyn Frankel  Encounter Date: 11/17/2014      PT End of Session - 11/17/14 1623    Visit Number 3   Number of Visits 12   Date for PT Re-Evaluation 12/22/14   PT Start Time 5859   PT Stop Time 1500   PT Time Calculation (min) 45 min   Activity Tolerance Patient tolerated treatment well;No increased pain   Behavior During Therapy [facility] for tasks assessed/performed      Past Medical History  Diagnosis Date  . Thyroid disease     No past surgical history on file.  There were no vitals filed for this visit.  Visit Diagnosis:  No diagnosis found.      Subjective Assessment - 11/17/14 1619    Subjective Patient reported 3/10 pain in proximal RUE upon arrival and reported no pain after treatment. "I am very impatient" Pt. presented as emotional and cried talking about her frustration with exercises.    Currently in Pain? Yes  she had just done exercises before treatment which she thinks may have caused the pain   Pain Score 5                          [facility] Adult PT Treatment/Exercise - 11/17/14 1629    Shoulder Exercises: Supine   Other Supine Exercises circles 1# cuff 20 reps; internal rotation 1# cuffs 15 reps; external rotation 15 reps:   Shoulder Exercises: Standing   Other Standing Exercises wall walks AAROM adbuction and flexion 20 reps for each   Shoulder Exercises: Pulleys   Flexion 3 minutes   ABduction 3 minutes   Other Pulley Exercises scaption 3 minutes   Other Pulley Exercises UE ranger flexion; horizontal abduction 6 minutes   Shoulder Exercises: ROM/Strengthening   Other ROM/Strengthening Exercises rows with yellow TB x15; shoulder extension with yellow TB  x10  NUSTEP UE only lvl 3 7 minutes                PT Education - 11/17/14 1511    Education provided Yes   Education Details Shoulder AROM flexion, abduction, flexion assisted cane   Person(s) Educated Patient   Methods Explanation;Handout   Comprehension Verbalized understanding          PT Short Term Goals - 11/17/14 1628    PT SHORT TERM GOAL #1   Title pt will be I with inital HEP ( 12/01/2014)   Time 3   Period Weeks   Status Achieved   PT SHORT TERM GOAL #2   Title pt will be able to verbalize and demostrate techniques to reduce R shoulder reinjury via postural awarenss,lifting and carrying mechanics, and HEP (12/01/2014)   Time 3   Period Weeks   Status New           PT Long Term Goals - 11/10/14 1143    PT LONG TERM GOAL #1   Title pt will be i with all HEP given throughout therapy (12/22/2014)   Time 6   Period Weeks   Status New   PT LONG TERM GOAL #2   Title she will demonstrate improve shoulder AROM by > 20 degrees with flexion/ abduction to help with job related activities (12/22/2014)  Time 6   Period Weeks   Status New   PT LONG TERM GOAL #3   Title she will increase R shoulder strength to > 4/5 in all planes to assist with ADLs (12/22/2014)   Time 6   Period Weeks   Status New   PT LONG TERM GOAL #4   Title she will be able to lift and carry > 10# overhead with < 2/10 pain to promote lifting associated with job related activities (12/22/2014)   Time 6   Period Weeks   Status New   PT LONG TERM GOAL #5   Title pt will increase her FOTO score to > 60 to demonstrate improved function at discharge (12/22/2014)   Time 6   Period Weeks   Status New               Plan - 11/17/14 1625    Clinical Impression Statement Patient continued to present as frustrated and impatient at how slow her progress is although during exercises she demomonstrated signifigant improvement in form during her HEP exercises and achieved short term goal  number 1..   PT Next Visit Plan assess response to HEP, shoulder strengthening, posture eduction      During this treatment session, the therapist was present, participating in and directing the treatment.   Problem List There are no active problems to display for this patient.  [name], [place] 11/17/2014 4:43 PM PHONE:[phone] FAX:[phone]  Hessie Diener, PTA 11/18/2014 10:26 AM Phone: [phone] Fax: [place] [place], [place], [address] Phone: [phone]   Fax:  [phone]  Name: Brandy Delgado MRN: [id number] Date of Birth: 1945/02/22

## 2014-11-22 ENCOUNTER — Ambulatory Visit: Payer: Medicare Other | Admitting: Physical Therapy

## 2014-11-22 DIAGNOSIS — R293 Abnormal posture: Secondary | ICD-10-CM

## 2014-11-22 DIAGNOSIS — R29898 Other symptoms and signs involving the musculoskeletal system: Secondary | ICD-10-CM

## 2014-11-22 DIAGNOSIS — M25611 Stiffness of right shoulder, not elsewhere classified: Secondary | ICD-10-CM

## 2014-11-22 NOTE — Therapy (Signed)
[place], [place], [address] Phone: [phone]   Fax:  [phone]  Physical Therapy Treatment  Patient Details  Name: Brandy Delgado MRN: [id number] Date of Birth: [birth year] Referring Provider: Dr Madlyn Frankel  Encounter Date: 11/22/2014      PT End of Session - 11/22/14 0843    Visit Number 4   Number of Visits 12   Date for PT Re-Evaluation 12/22/14   PT Start Time 0745   PT Stop Time 0843   PT Time Calculation (min) 58 min   Activity Tolerance Patient tolerated treatment well   Behavior During Therapy [facility] for tasks assessed/performed      Past Medical History  Diagnosis Date  . Thyroid disease     No past surgical history on file.  There were no vitals filed for this visit.  Visit Diagnosis:  Right arm weakness  Abnormal posture  Decreased ROM of right shoulder      Subjective Assessment - 11/22/14 0749    Subjective 0/10  now.    Currently in Pain? No/denies   Pain Score 5    Pain Location Shoulder   Pain Orientation Right   Pain Radiating Towards tingling into rt hand  RT upper trap into neck   Pain Frequency Intermittent   Aggravating Factors  After PT, SOMETIMES CAN'T SLEEP as comfortable.      Pain Relieving Factors medicTION                         [facility] Adult PT Treatment/Exercise - 11/22/14 0806    Shoulder Exercises: Supine   Other Supine Exercises SERRATUS 2 lbs 10 x, 2 SETS OF 10   Other Supine Exercises 2 LBS BICEPS 10 x 2   Shoulder Exercises: Sidelying   Flexion 10 reps   Shoulder Exercises: ROM/Strengthening   UBE (Upper Arm Bike) Nustep L 6 6 minutes   Other ROM/Strengthening Exercises 10 X yellow band row.     Other ROM/Strengthening Exercises   Flexion 50, abduction 52. Passive flexion 160.  ER 82 degrees Arom.      Shoulder Exercises: Isometric Strengthening   Flexion Limitations 10 X 10   Extension Limitations 10 X 10, cues                   PT Short Term Goals - 11/22/14 0846    PT SHORT TERM GOAL #1   Title pt will be I with inital HEP ( 12/01/2014)   Time 3   Period Weeks   Status Achieved   PT SHORT TERM GOAL #2   Title pt will be able to verbalize and demostrate techniques to reduce R shoulder reinjury via postural awarenss,lifting and carrying mechanics, and HEP (12/01/2014)   Baseline understands shoilder posture   Time 3   Period Weeks   Status Partially Met           PT Long Term Goals - 11/10/14 1143    PT LONG TERM GOAL #1   Title pt will be i with all HEP given throughout therapy (12/22/2014)   Time 6   Period Weeks   Status New   PT LONG TERM GOAL #2   Title she will demonstrate improve shoulder AROM by > 20 degrees with flexion/ abduction to help with job related activities (12/22/2014)   Time 6   Period Weeks   Status New   PT LONG TERM GOAL #3  Title she will increase R shoulder strength to > 4/5 in all planes to assist with ADLs (12/22/2014)   Time 6   Period Weeks   Status New   PT LONG TERM GOAL #4   Title she will be able to lift and carry > 10# overhead with < 2/10 pain to promote lifting associated with job related activities (12/22/2014)   Time 6   Period Weeks   Status New   PT LONG TERM GOAL #5   Title pt will increase her FOTO score to > 60 to demonstrate improved function at discharge (12/22/2014)   Time 6   Period Weeks   Status New               Plan - 11/22/14 0844    Clinical Impression Statement 50 DEGREES arom FLEXION.  RT.  ER WNL Abduction 52 degrees .  PROM Abduction, Flexion WNL:   160.  2/10 pain post exercises.  Declined cold pack.  Slow Progress with ROM.  Patient frustrated.  Intermittantly tearful.     PT Next Visit Plan see what MD says.  Try prone shoulder extension   Consulted and Agree with Plan of Care Patient        Problem List There are no active problems to display for this patient.   [facility] 11/22/2014, 8:47  AM  [facility] [address] [place], [place], [address] Phone: [phone]   Fax:  [phone]  Name: Brandy Delgado MRN: [id number] Date of Birth: Nov 09, 1945    Melvenia Needles, PTA 11/22/2014 8:47 AM Phone: [phone] Fax: [phone]

## 2014-11-24 ENCOUNTER — Ambulatory Visit: Payer: Medicare Other | Admitting: Physical Therapy

## 2014-11-24 DIAGNOSIS — R293 Abnormal posture: Secondary | ICD-10-CM

## 2014-11-24 DIAGNOSIS — M25611 Stiffness of right shoulder, not elsewhere classified: Secondary | ICD-10-CM

## 2014-11-24 DIAGNOSIS — R29898 Other symptoms and signs involving the musculoskeletal system: Secondary | ICD-10-CM | POA: Diagnosis not present

## 2014-11-24 NOTE — Therapy (Signed)
[place], [place], [address] Phone: [phone]   Fax:  [phone]  Physical Therapy Treatment  Patient Details  Name: Brandy Delgado MRN: [id number] Date of Birth: 14-Aug-1945 Referring Provider: Dr Madlyn Frankel  Encounter Date: 11/24/2014      PT End of Session - 11/24/14 1418    Visit Number 5   Number of Visits 12   Date for PT Re-Evaluation 12/22/14   PT Start Time 6712   PT Stop Time 1500   PT Time Calculation (min) 45 min      Past Medical History  Diagnosis Date  . Thyroid disease     No past surgical history on file.  There were no vitals filed for this visit.  Visit Diagnosis:  Right arm weakness  Abnormal posture  Decreased ROM of right shoulder      Subjective Assessment - 11/24/14 1416    Subjective "The doctor said I could go back to work on the 30th of november and to just continue therapy"                         [facility] Adult PT Treatment/Exercise - 11/24/14 1430    Shoulder Exercises: Supine   Other Supine Exercises circles clockwise and counter clockwise with a 1# wieght/ serratus punches 2 sets (second set 2#) 20 reps 1# flexion with 1# weight   Other Supine Exercises AAROM with cane flexion/ horizontal abd/ horizontal add/ 15 reps for each/ rhythmic stabilization of the shoulder x 1 minute    Shoulder Exercises: Prone   Extension Strengthening;10 reps   Shoulder Exercises: Pulleys   Other Pulley Exercises UE ranger flexion; horizontal abduction 6 minutes   Shoulder Exercises: Therapy Ball   Other Therapy Ball Exercises flexion up wall x 20 side to side 20 times   Shoulder Exercises: ROM/Strengthening   UBE (Upper Arm Bike) level 2 3 minutes forward 3 minutes backwards   Shoulder Exercises: Isometric Strengthening   Flexion Limitations 10 X 10   Extension Limitations 10 X 10, cues                  PT Short Term Goals - 11/22/14 0846     PT SHORT TERM GOAL #1   Title pt will be I with inital HEP ( 12/01/2014)   Time 3   Period Weeks   Status Achieved   PT SHORT TERM GOAL #2   Title pt will be able to verbalize and demostrate techniques to reduce R shoulder reinjury via postural awarenss,lifting and carrying mechanics, and HEP (12/01/2014)   Baseline understands shoilder posture   Time 3   Period Weeks   Status Partially Met           PT Long Term Goals - 11/10/14 1143    PT LONG TERM GOAL #1   Title pt will be i with all HEP given throughout therapy (12/22/2014)   Time 6   Period Weeks   Status New   PT LONG TERM GOAL #2   Title she will demonstrate improve shoulder AROM by > 20 degrees with flexion/ abduction to help with job related activities (12/22/2014)   Time 6   Period Weeks   Status New   PT LONG TERM GOAL #3   Title she will increase R shoulder strength to > 4/5 in all planes to assist with ADLs (12/22/2014)   Time 6   Period Weeks  Status New   PT LONG TERM GOAL #4   Title she will be able to lift and carry > 10# overhead with < 2/10 pain to promote lifting associated with job related activities (12/22/2014)   Time 6   Period Weeks   Status New   PT LONG TERM GOAL #5   Title pt will increase her FOTO score to > 60 to demonstrate improved function at discharge (12/22/2014)   Time [place] - 11/24/14 1419    Clinical Impression Statement Patient presents with lack of strength in her right shoulder but still has full range passively. The doctor gave her a back to work date of november 30with no imaging and told her that "her shoulder was a mess, and it would take time"  treatment was focused on strengthening and  AROM in the shoulder.   PT Next Visit Plan cont UBE and shoulder strengthening        Problem List There are no active problems to display for this patient.  [name], [place] 11/24/2014 3:02  PM PHONE:[phone] [facility] [place], [place], [address] Phone: [phone]   Fax:  [phone]  Name: DALAYLA ALDREDGE MRN: [id number] Date of Birth: 1945/05/19

## 2014-11-29 ENCOUNTER — Ambulatory Visit: Payer: Medicare Other | Admitting: Physical Therapy

## 2014-11-29 DIAGNOSIS — R29898 Other symptoms and signs involving the musculoskeletal system: Secondary | ICD-10-CM

## 2014-11-29 DIAGNOSIS — M25611 Stiffness of right shoulder, not elsewhere classified: Secondary | ICD-10-CM

## 2014-11-29 DIAGNOSIS — R293 Abnormal posture: Secondary | ICD-10-CM

## 2014-11-29 NOTE — Therapy (Signed)
[place], [place], [address] Phone: [phone]   Fax:  [phone]  Physical Therapy Treatment  Patient Details  Name: Brandy Delgado MRN: [id number] Date of Birth: [birth year] Referring Provider: Dr Madlyn Frankel  Encounter Date: 11/29/2014      PT End of Session - 11/29/14 1702    Visit Number 6   Number of Visits 12   Date for PT Re-Evaluation 12/22/14   Authorization Type Medicare: Kx mod by 15th visit, progress note by 9th visit   PT Start Time 1415   PT Stop Time 1500   PT Time Calculation (min) 45 min   Activity Tolerance Patient tolerated treatment well   Behavior During Therapy [facility] for tasks assessed/performed      Past Medical History  Diagnosis Date  . Thyroid disease     No past surgical history on file.  There were no vitals filed for this visit.  Visit Diagnosis:  Right arm weakness  Abnormal posture  Decreased ROM of right shoulder      Subjective Assessment - 11/29/14 1420    Subjective "I am still having trouble raising my arm up while I do my wall walks"    Currently in Pain? Yes   Pain Score 4    Pain Location Shoulder   Pain Orientation Right   Pain Descriptors / Indicators Aching;Sore   Pain Type Surgical pain   Pain Onset More than a month ago   Pain Frequency Intermittent   Aggravating Factors  laying on the side   Pain Relieving Factors Medication                          [facility] Adult PT Treatment/Exercise - 11/29/14 1424    Shoulder Exercises: Supine   External Rotation AROM;Strengthening;Both;10 reps;Theraband  with retraction   Theraband Level (Shoulder External Rotation) Level 2 (Red)   Other Supine Exercises circles clockwise and counter clockwise with a 4# wieght/ serratus punches 2 sets (second set 4#) 20 reps 4# flexion with 4# weight   Other Supine Exercises unresisted shoulder flexion with resisted eccentric lowering with tan  theraband 2 x 10   Shoulder Exercises: Seated   Row AROM;Strengthening;Right;15 reps;Theraband  x 2 sets   Theraband Level (Shoulder Row) Level 2 (Red)   Other Seated Exercises resisted horizontal abd/adduction with red theraband and hand in UE ranger, and protraction 2 x 20 each    Other Seated Exercises bicep curls 4# 3 x 10   Shoulder Exercises: Prone   Extension Strengthening;10 reps   Other Prone Exercises I's, T's, Y's x 10 each  diffiiculty with T's and Y's    Shoulder Exercises: Standing   Other Standing Exercises wall walks AAROM adbuction and flexion 20 reps for each   Shoulder Exercises: Pulleys   Other Pulley Exercises UE ranger flexion; horizontal abduction 6 minutes   Shoulder Exercises: Therapy Ball   Other Therapy Ball Exercises flexion up wall x 20 side to side 20 times   Shoulder Exercises: ROM/Strengthening   UBE (Upper Arm Bike) L 2 x 8 min  alternating direction every 4 min                PT Education - 11/29/14 1702    Education Details updated HEP resistive band   Person(s) Educated Patient   Methods Explanation   Comprehension Verbalized understanding          PT Short Term Goals -  11/29/14 1711    PT SHORT TERM GOAL #1   Title pt will be I with inital HEP ( 12/01/2014)   Time 3   Period Weeks   Status Achieved   PT SHORT TERM GOAL #2   Title pt will be able to verbalize and demostrate techniques to reduce R shoulder reinjury via postural awarenss,lifting and carrying mechanics, and HEP (12/01/2014)   Baseline understands shoilder posture   Time 3   Period Weeks   Status Achieved           PT Long Term Goals - 11/29/14 1710    PT LONG TERM GOAL #1   Title pt will be i with all HEP given throughout therapy (12/22/2014)   Time 6   Period Weeks   Status On-going   PT LONG TERM GOAL #2   Title she will demonstrate improve shoulder AROM by > 20 degrees with flexion/ abduction to help with job related activities (12/22/2014)   Time 6    Period Weeks   Status On-going   PT LONG TERM GOAL #3   Title she will increase R shoulder strength to > 4/5 in all planes to assist with ADLs (12/22/2014)   Time 6   Period Weeks   Status On-going   PT LONG TERM GOAL #4   Title she will be able to lift and carry > 10# overhead with < 2/10 pain to promote lifting associated with job related activities (12/22/2014)   Time 6   Period Weeks   Status On-going   PT LONG TERM GOAL #5   Time 6   Period Weeks   Status On-going               Plan - 11/29/14 1702    Clinical Impression Statement Brandy Delgado continues to lack strength in the R shoulder. Today she reported having more pain in the shoulder possibly due to sleeping on it. she was able to do supine exercises with with increased focus on eccentric training, and scapular stability with tricep/bicep strengthening. pt reports her pain dropped at the end of the session.    PT Next Visit Plan cont UBE and shoulder strengthening   PT Home Exercise Plan updated theraband resistance   Consulted and Agree with Plan of Care Patient        Problem List There are no active problems to display for this patient.  [name] PT, DPT, LAT, ATC  11/29/2014  5:15 PM     [facility] [address] [place], [place], [address] Phone: [phone]   Fax:  [phone]  Name: Brandy Delgado MRN: [id number] Date of Birth: 03-Dec-1945

## 2014-12-01 ENCOUNTER — Ambulatory Visit: Payer: Medicare Other | Admitting: Physical Therapy

## 2014-12-01 DIAGNOSIS — R293 Abnormal posture: Secondary | ICD-10-CM

## 2014-12-01 DIAGNOSIS — R29898 Other symptoms and signs involving the musculoskeletal system: Secondary | ICD-10-CM | POA: Diagnosis not present

## 2014-12-01 DIAGNOSIS — M25611 Stiffness of right shoulder, not elsewhere classified: Secondary | ICD-10-CM

## 2014-12-01 NOTE — Therapy (Signed)
[place], [place], [address] Phone: [phone]   Fax:  [phone]  Physical Therapy Treatment  Patient Details  Name: Brandy Delgado MRN: [id number] Date of Birth: 01-Oct-1945 Referring Provider: Dr Madlyn Frankel  Encounter Date: 12/01/2014      PT End of Session - 12/01/14 1435    Visit Number 7   Number of Visits 12   Date for PT Re-Evaluation 12/22/14   Authorization Type Medicare: Kx mod by 15th visit, progress note by 9th visit   PT Start Time 1350   PT Stop Time 1435   PT Time Calculation (min) 45 min   Activity Tolerance Patient tolerated treatment well   Behavior During Therapy Brandy [place] for tasks assessed/performed      Past Medical History  Diagnosis Date  . Thyroid disease     No past surgical history on file.  There were no vitals filed for this visit.  Visit Diagnosis:  Right arm weakness  Abnormal posture  Decreased ROM of right shoulder      Subjective Assessment - 12/01/14 1349    Subjective "I am able to lift my elbow out to the side but I have trouble lifting my arm forward, I can now reach the steering wheel"    Currently in Pain? Yes   Pain Score 0-No pain   Pain Location Shoulder   Pain Orientation Right   Pain Descriptors / Indicators Aching   Pain Type Surgical pain   Pain Onset More than a month ago            Brandy [place] PT Assessment - 12/01/14 0001    AROM   Right Shoulder Flexion 34 Degrees   Right Shoulder ABduction 42 Degrees  with shoulder hiking                     [facility] Adult PT Treatment/Exercise - 12/01/14 0001    Shoulder Exercises: Supine   Other Supine Exercises circles clockwise and counter clockwise with a 4# wieght/ serratus punches 2 sets (second set 4#) 20 reps 4# flexion with 4# weight   Other Supine Exercises unresisted shoulder flexion with resisted eccentric lowering with yellow theraband 2 x 10   Shoulder Exercises: Seated   Other Seated Exercises resisted horizontal abd/adduction with red theraband and hand in UE ranger   Other Seated Exercises bicep curls 4# 3 x 10, coracobrachialis strengthening 2 x 10 without weight  difficulty getting full motion with coracobrachialis ex.    Shoulder Exercises: Prone   Other Prone Exercises I's, T's, Y's x 10 each  3# during I's, unresisted with T's and Y's   Shoulder Exercises: Standing   Extension AROM;Strengthening;Both;15 reps;Theraband   Theraband Level (Shoulder Extension) Level 2 (Red)   Row AROM;Strengthening;15 reps;Theraband   Theraband Level (Shoulder Row) Level 2 (Red)   Other Standing Exercises wall walks AAROM adbuction and flexion 20 reps for each   Shoulder Exercises: ROM/Strengthening   UBE (Upper Arm Bike) L4 x 10 min  changing direction @ 5 min                  PT Short Term Goals - 11/29/14 1711    PT SHORT TERM GOAL #1   Title pt will be I with inital HEP ( 12/01/2014)   Time 3   Period Weeks   Status Achieved   PT SHORT TERM GOAL #2   Title pt will be able to verbalize and demostrate techniques  to reduce R shoulder reinjury via postural awarenss,lifting and carrying mechanics, and HEP (12/01/2014)   Baseline understands shoilder posture   Time 3   Period Weeks   Status Achieved           PT Long Term Goals - 11/29/14 1710    PT LONG TERM GOAL #1   Title pt will be i with all HEP given throughout therapy (12/22/2014)   Time 6   Period Weeks   Status On-going   PT LONG TERM GOAL #2   Title she will demonstrate improve shoulder AROM by > 20 degrees with flexion/ abduction to help with job related activities (12/22/2014)   Time 6   Period Weeks   Status On-going   PT LONG TERM GOAL #3   Title she will increase R shoulder strength to > 4/5 in all planes to assist with ADLs (12/22/2014)   Time 6   Period Weeks   Status On-going   PT LONG TERM GOAL #4   Title she will be able to lift and carry > 10# overhead with < 2/10 pain  to promote lifting associated with job related activities (12/22/2014)   Time 6   Period Weeks   Status On-going   PT LONG TERM GOAL #5   Time 6   Period Weeks   Status On-going               Plan - 12/01/14 1435    Clinical Impression Statement Brandy Delgado has med some progress with AROM with R shoulder flexion to 34 degrees, and abduction to 42. she was able to complete all exercise without complaint of pain. She conitnues to exhibit significant weakness with supine flexion with resistive eccentric lowering, and with T's and Y's in prone. following todays session she reports she feels like she has no pain and feels like she is getting better.    PT Next Visit Plan cont UBE and shoulder strengthening   PT Home Exercise Plan no new HEP   Consulted and Agree with Plan of Care Patient        Problem List There are no active problems to display for this patient.  [name] PT, DPT, LAT, ATC  12/01/2014  2:38 PM     [place] [address] [place], [place], [address] Phone: [phone]   Fax:  [phone]  Name: Brandy Del[place] MRN: [id number] Date of Birth: [birth year]

## 2014-12-06 ENCOUNTER — Ambulatory Visit: Payer: Medicare Other | Admitting: Physical Therapy

## 2014-12-06 DIAGNOSIS — R29898 Other symptoms and signs involving the musculoskeletal system: Secondary | ICD-10-CM

## 2014-12-06 DIAGNOSIS — M25611 Stiffness of right shoulder, not elsewhere classified: Secondary | ICD-10-CM

## 2014-12-06 DIAGNOSIS — R293 Abnormal posture: Secondary | ICD-10-CM

## 2014-12-06 NOTE — Therapy (Signed)
[place], [place], [address] Phone: [phone]   Fax:  [phone]  Physical Therapy Treatment  Patient Details  Name: Brandy [place] MRN: [id number] Date of Birth: 04-11-1945 Referring Provider: Dr Madlyn Frankel  Encounter Date: 12/06/2014      PT End of Session - 12/06/14 0842    Visit Number 8   Number of Visits 12   PT Start Time 0805   PT Stop Time 0844   PT Time Calculation (min) 39 min   Activity Tolerance Patient tolerated treatment well   Behavior During Therapy [facility] for tasks assessed/performed      Past Medical History  Diagnosis Date  . Thyroid disease     No past surgical history on file.  There were no vitals filed for this visit.  Visit Diagnosis:  Right arm weakness  Decreased ROM of right shoulder  Abnormal posture      Subjective Assessment - 12/06/14 0815    Subjective Driving a little better.     Currently in Pain? Yes   Pain Score --  mild   Pain Orientation Right   Pain Descriptors / Indicators Sore   Pain Radiating Towards tingling , sometimes to elblw and sometimes to fingers at least 2 X a week   Aggravating Factors  stretches                         [facility] Adult PT Treatment/Exercise - 12/06/14 0823    Shoulder Exercises: Supine   Other Supine Exercises circles, horizontal abduction add, 15 2 LBS triceps 2 LBS 15 X    Shoulder Exercises: Prone   Other Prone Exercises I, T Y   10 X  also extension and row   Shoulder Exercises: Standing   Other Standing Exercises wall slides with lift off then lower about 7 X prior to fatigue,   Shoulder Exercises: ROM/Strengthening   UBE (Upper Arm Bike) L1.5 9.5 minuted   Shoulder Exercises: Isometric Strengthening   Flexion Limitations 10 X 10   Extension Limitations 10 X 10                  PT Short Term Goals - 11/29/14 1711    PT SHORT TERM GOAL #1   Title pt will be I with inital HEP  ( 12/01/2014)   Time 3   Period Weeks   Status Achieved   PT SHORT TERM GOAL #2   Title pt will be able to verbalize and demostrate techniques to reduce R shoulder reinjury via postural awarenss,lifting and carrying mechanics, and HEP (12/01/2014)   Baseline understands shoilder posture   Time 3   Period Weeks   Status Achieved           PT Long Term Goals - 12/06/14 [id number]    PT LONG TERM GOAL #1   Title pt will be i with all HEP given throughout therapy (12/22/2014)   Time 6   Period Weeks   Status On-going   PT LONG TERM GOAL #2   Title she will demonstrate improve shoulder AROM by > 20 degrees with flexion/ abduction to help with job related activities (12/22/2014)   Time 6   Period Weeks   Status On-going   PT LONG TERM GOAL #3   Title she will increase R shoulder strength to > 4/5 in all planes to assist with ADLs (12/22/2014)   Time 6   Period Weeks  Status On-going   PT LONG TERM GOAL #4   Title she will be able to lift and carry > 10# overhead with < 2/10 pain to promote lifting associated with job related activities (12/22/2014)   Time 6   Period Weeks   Status On-going   PT LONG TERM GOAL #5   Title pt will increase her FOTO score to > 60 to demonstrate improved function at discharge (12/22/2014)   Period Weeks   Status Unable to assess               Plan - 12/06/14 0843    Clinical Impression Statement She plans to return to work on the 30th.  Flexion 44 degrees.  Abduction 34 AROM RT.  Declined cold, will do at home.     PT Next Visit Plan cont UBE and shoulder strengthening   PT Home Exercise Plan no new HEP   Consulted and Agree with Plan of Care Patient        Problem List There are no active problems to display for this patient.   A Rosie Place 12/06/2014, 8:45 AM  [facility] [address] [place], [place], [address] Phone: [phone]   Fax:  [phone]  Name: Brandy [place] MRN:  [id number] Date of Birth: 26-Sep-1945    Melvenia Needles, PTA 12/06/2014 8:45 AM Phone: [phone] Fax: [phone]

## 2014-12-08 ENCOUNTER — Ambulatory Visit: Payer: Medicare Other | Admitting: Physical Therapy

## 2014-12-08 DIAGNOSIS — M25611 Stiffness of right shoulder, not elsewhere classified: Secondary | ICD-10-CM

## 2014-12-08 DIAGNOSIS — R29898 Other symptoms and signs involving the musculoskeletal system: Secondary | ICD-10-CM | POA: Diagnosis not present

## 2014-12-08 DIAGNOSIS — R293 Abnormal posture: Secondary | ICD-10-CM

## 2014-12-08 NOTE — Therapy (Signed)
[place], [place], [address] Phone: [phone]   Fax:  [phone]  Physical Therapy Treatment  Patient Details  Name: MYLIA EVERSMAN MRN: [id number] Date of Birth: [birth year] Referring Provider: Dr Madlyn Frankel  Encounter Date: 12/08/2014      PT End of Session - 12/08/14 1329    Visit Number 9   Number of Visits 12   Date for PT Re-Evaluation 12/22/14   PT Start Time 1330   PT Stop Time 1415   PT Time Calculation (min) 45 min      Past Medical History  Diagnosis Date  . Thyroid disease     No past surgical history on file.  There were no vitals filed for this visit.  Visit Diagnosis:  Right arm weakness  Decreased ROM of right shoulder  Abnormal posture      Subjective Assessment - 12/08/14 1332    Subjective Having a lot of tingling in my shoulder going down to my elbow"  Going back to see the doctor Dec 6th. "I just don't rest too well at night."   Pain Score 2                          [facility] Adult PT Treatment/Exercise - 12/08/14 1401    Shoulder Exercises: Supine   Other Supine Exercises circles clockwise and counter clockwise with a 4# wieght/ serratus punches 2 sets (second set 4#) 20 reps 4# flexion with 4# weight   Other Supine Exercises    Shoulder Exercises: Seated   Other Seated Exercises resisted horizontal abd/adduction with red theraband and hand in UE ranger   Other Seated Exercises bicep curls 4# 3 x 10, coracobrachialis strengthening 2 x 10 without weight     Shoulder Exercises: Prone   Other Prone Exercises I's, T's, Y's x 10 each  3# during I's, unresisted with T's and Y's   Shoulder Exercises: Standing   Extension AROM;Strengthening;Both;15 reps;Theraband   Theraband Level (Shoulder Extension) Level 2 (Red)   Row AROM;Strengthening;15 reps;Theraband   Theraband Level (Shoulder Row) Level 2 (Red)   Other Standing Exercises wall walks with  therapy ball AAROM adbuction and flexion 20 reps for each   Shoulder Exercises: ROM/Strengthening   UBE (Upper Arm Bike) L 2 x 8 min  alternating direction every 4 min                  PT Short Term Goals - 11/29/14 1711    PT SHORT TERM GOAL #1   Title pt will be I with inital HEP ( 12/01/2014)   Time 3   Period Weeks   Status Achieved   PT SHORT TERM GOAL #2   Title pt will be able to verbalize and demostrate techniques to reduce R shoulder reinjury via postural awarenss,lifting and carrying mechanics, and HEP (12/01/2014)   Baseline understands shoilder posture   Time 3   Period Weeks   Status Achieved           PT Long Term Goals - 12/06/14 [id number]    PT LONG TERM GOAL #1   Title pt will be i with all HEP given throughout therapy (12/22/2014)   Time 6   Period Weeks   Status On-going   PT LONG TERM GOAL #2   Title she will demonstrate improve shoulder AROM by > 20 degrees with flexion/ abduction to help with job related activities (12/22/2014)   Time  6   Period Weeks   Status On-going   PT LONG TERM GOAL #3   Title she will increase R shoulder strength to > 4/5 in all planes to assist with ADLs (12/22/2014)   Time 6   Period Weeks   Status On-going   PT LONG TERM GOAL #4   Title she will be able to lift and carry > 10# overhead with < 2/10 pain to promote lifting associated with job related activities (12/22/2014)   Time 6   Period Weeks   Status On-going   PT LONG TERM GOAL #5   Title pt will increase her FOTO score to > 60 to demonstrate improved function at discharge (12/22/2014)   Period Weeks   Status Unable to assess               Plan - 12/08/14 1337    Clinical Impression Statement Pt. is still planning on returning to work on the 30th. She has some tingling in her shoulder to be noted and is returning to the doctor on Dec. 6th. Continued with mat exercises, shoulder strengthening and ROM exercises this treatment. Declined cold will do at  home.   PT Next Visit Plan cont UBE and shoulder strengthening        Problem List There are no active problems to display for this patient.  [name], [place] 12/08/2014 2:08 PM PHONE:[phone] [facility] [place], [place], [address] Phone: [phone]   Fax:  [phone]  Name: DONIESHA RHINES MRN: [id number] Date of Birth: [birth year]

## 2014-12-13 ENCOUNTER — Ambulatory Visit: Payer: Medicare Other | Admitting: Physical Therapy

## 2014-12-13 DIAGNOSIS — M25611 Stiffness of right shoulder, not elsewhere classified: Secondary | ICD-10-CM

## 2014-12-13 DIAGNOSIS — R29898 Other symptoms and signs involving the musculoskeletal system: Secondary | ICD-10-CM | POA: Diagnosis not present

## 2014-12-13 DIAGNOSIS — R293 Abnormal posture: Secondary | ICD-10-CM

## 2014-12-13 NOTE — Therapy (Signed)
[place], [place], [address] Phone: [phone]   Fax:  [phone]  Physical Therapy Treatment  Patient Details  Name: Brandy Delgado MRN: [id number] Date of Birth: November 09, 1945 Referring Provider: Dr Madlyn Frankel  Encounter Date: 12/13/2014      PT End of Session - 12/13/14 0852    Visit Number 10   Number of Visits 12   Date for PT Re-Evaluation 12/22/14   PT Start Time 0845   PT Stop Time 0930   PT Time Calculation (min) 45 min      Past Medical History  Diagnosis Date  . Thyroid disease     No past surgical history on file.  There were no vitals filed for this visit.  Visit Diagnosis:  Right arm weakness  Decreased ROM of right shoulder  Abnormal posture      Subjective Assessment - 12/13/14 0848    Subjective My pain was really bad this morning (reported 10/10). "The tingling has not gone away." reported 3/10 this morning. Will see the Doctor on Dec. 6th   Currently in Pain? Yes   Pain Score 3    Pain Location Shoulder   Pain Orientation Right   Pain Descriptors / Indicators Sore   Pain Type Surgical pain   Pain Radiating Towards tingling in between the shoulder and elbow             [facility] PT Assessment - 12/13/14 0001    AROM   Right Shoulder Flexion 42 Degrees   Right Shoulder ABduction 48 Degrees   Strength   Right Shoulder Flexion 3-/5   Right Shoulder Extension 3-/5   Right Shoulder ABduction 3-/5   Right Shoulder Internal Rotation 4/5   Right Shoulder External Rotation 4/5                     [facility] Adult PT Treatment/Exercise - 12/13/14 0001    Shoulder Exercises: Supine   Other Supine Exercises circles, horizontal abduction add, 15 2 LBS triceps 2 LBS 15 X    Other Supine Exercises R flexion 2 sets 10   Shoulder Exercises: Seated   Other Seated Exercises resisted horizontal abd/adduction with red theraband and hand in UE ranger   Other Seated  Exercises bicep curls 4# 3 x 10, coracobrachialis strengthening 2 x 10 without weight  difficulty getting full motion with coracobrachialis ex.    Shoulder Exercises: Standing   Extension AROM;Strengthening;Both;15 reps;Theraband   Theraband Level (Shoulder Extension) Level 2 (Red)   Row AROM;Strengthening;15 reps;Theraband   Theraband Level (Shoulder Row) Level 2 (Red)   Other Standing Exercises wall walks AAROM adbuction and flexion 20 reps for each   Other Standing Exercises bodyblade abduction 30 seconds   Shoulder Exercises: Pulleys   Other Pulley Exercises UE ranger flexion; horizontal abduction 10 reps each with 2# cuff weight   Shoulder Exercises: Therapy Ball   Other Therapy Ball Exercises flexion up wall x 20 side to side 20 times   Shoulder Exercises: ROM/Strengthening   UBE (Upper Arm Bike) L 2 x 8 min  alternating direction every 4 min                  PT Short Term Goals - 12/13/14 [id number]    PT SHORT TERM GOAL #1   Title pt will be I with inital HEP ( 12/01/2014)   Time 3   Period Weeks   Status Achieved   PT SHORT TERM  GOAL #2   Title pt will be able to verbalize and demostrate techniques to reduce R shoulder reinjury via postural awarenss,lifting and carrying mechanics, and HEP (12/01/2014)   Baseline understands shoilder posture   Time 3   Period Weeks   Status Achieved           PT Long Term Goals - 12/13/14 [id number]    PT LONG TERM GOAL #1   Title pt will be i with all HEP given throughout therapy (12/22/2014)   Time 6   Period Weeks   Status On-going   PT LONG TERM GOAL #4   Title she will be able to lift and carry > 10# overhead with < 2/10 pain to promote lifting associated with job related activities (12/22/2014)   Time 6   Period Weeks   Status On-going   PT LONG TERM GOAL #5   Title pt will increase her FOTO score to > 60 to demonstrate improved function at discharge (12/22/2014)   Time 6   Period Weeks   Status Unable to assess                Plan - 12/13/14 0855    Clinical Impression Statement Pt. presenting with tingling that has gotten more frequent since surgery and will be returning to the doctor for imaging on Dec. 6th. Pt. is gaining strength in her R shoulder but is still progressing slowly, although strength has improved to a 4/5 with internal and external rotation of the R shoulder. Tx. cont. to focused on strengtheing and AROM of the Rt. Shoulder and progressing towards ROM and strength goals.   PT Next Visit Plan cont UBE and shoulder strengthening. Eccentric flexion        Problem List There are no active problems to display for this patient.  [name], [place] 12/13/2014 9:31 AM PHONE:[phone] [facility] [place], [place], [address] Phone: [phone]   Fax:  [phone]  Name: Brandy Delgado MRN: [id number] Date of Birth: 1945-09-24

## 2014-12-15 ENCOUNTER — Encounter: Payer: Medicare Other | Admitting: Physical Therapy

## 2014-12-15 ENCOUNTER — Ambulatory Visit: Payer: Medicare Other | Admitting: Physical Therapy

## 2014-12-15 DIAGNOSIS — R293 Abnormal posture: Secondary | ICD-10-CM

## 2014-12-15 DIAGNOSIS — M25611 Stiffness of right shoulder, not elsewhere classified: Secondary | ICD-10-CM

## 2014-12-15 DIAGNOSIS — R29898 Other symptoms and signs involving the musculoskeletal system: Secondary | ICD-10-CM

## 2014-12-15 NOTE — Therapy (Signed)
[place], [place], [address] Phone: [phone]   Fax:  [phone]  Physical Therapy Treatment  Patient Details  Name: Brandy Delgado MRN: [id number] Date of Birth: September 19, 1945 Referring Provider: Dr Madlyn Frankel  Encounter Date: 12/15/2014      PT End of Session - 12/15/14 1546    Visit Number 11   Number of Visits 12   PT Start Time [id number]   PT Stop Time 1612   PT Time Calculation (min) 60 min      Past Medical History  Diagnosis Date  . Thyroid disease     No past surgical history on file.  There were no vitals filed for this visit.  Visit Diagnosis:  Right arm weakness  Decreased ROM of right shoulder  Abnormal posture      Subjective Assessment - 12/15/14 1517    Subjective Went back and did a full day of work and did well. "It's good to have my income back" "I didn't have any tingling while I worked today"   Currently in Pain? Yes   Pain Score 3    Pain Location Shoulder   Pain Orientation Right   Pain Descriptors / Indicators Sore   Pain Type Surgical pain   Pain Onset More than a month ago   Pain Frequency Intermittent                         [facility] Adult PT Treatment/Exercise - 12/15/14 1525    Shoulder Exercises: Supine   Other Supine Exercises circles, horizontal abduction add, 15 2 LBS triceps 2 LBS 15 X    Other Supine Exercises R flexion 2 sets 10   Shoulder Exercises: Seated   Other Seated Exercises resisted horizontal abd/adduction with red theraband and hand in UE ranger   Other Seated Exercises bicep curls 4# 3 x 10, coracobrachialis strengthening 2 x 10 without weight  difficulty getting full motion with coracobrachialis ex.    Shoulder Exercises: Prone   Other Prone Exercises tricep extention x10   Other Prone Exercises Shoulder rows in prone  3# during I's, unresisted with T's and Y's   Shoulder Exercises: Standing   Extension  AROM;Strengthening;Both;15 reps;Theraband   Theraband Level (Shoulder Extension) Level 2 (Red)   Row AROM;Strengthening;15 reps;Theraband   Theraband Level (Shoulder Row) Level 2 (Red)   Other Standing Exercises wall walks AAROM adbuction and flexion 20 reps for each   Other Standing Exercises walk walks with 10 second holds with physioball   Shoulder Exercises: Pulleys   Flexion 3 minutes   ABduction 3 minutes   Other Pulley Exercises scaption 3 minutes   Other Pulley Exercises UE ranger flexion; horizontal abduction 10 reps each with 2# cuff weight   Shoulder Exercises: Therapy Ball   Other Therapy Ball Exercises flexion up wall x 20 side to side 20 times   Shoulder Exercises: ROM/Strengthening   UBE (Upper Arm Bike) L 2 x 8 min  alternating direction every 4 min   Shoulder Exercises: Body Blade   Other Body Blade Exercises to the side and into abduction 2 minutes each   Cryotherapy   Number Minutes Cryotherapy 15 Minutes   Cryotherapy Location Shoulder   Type of Cryotherapy Ice pack                  PT Short Term Goals - 12/13/14 0853    PT SHORT TERM GOAL #1   Title  pt will be I with inital HEP ( 12/01/2014)   Time 3   Period Weeks   Status Achieved   PT SHORT TERM GOAL #2   Title pt will be able to verbalize and demostrate techniques to reduce R shoulder reinjury via postural awarenss,lifting and carrying mechanics, and HEP (12/01/2014)   Baseline understands shoilder posture   Time 3   Period Weeks   Status Achieved           PT Long Term Goals - 12/13/14 [id number]    PT LONG TERM GOAL #1   Title pt will be i with all HEP given throughout therapy (12/22/2014)   Time 6   Period Weeks   Status On-going   PT LONG TERM GOAL #4   Title she will be able to lift and carry > 10# overhead with < 2/10 pain to promote lifting associated with job related activities (12/22/2014)   Time 6   Period Weeks   Status On-going   PT LONG TERM GOAL #5   Title pt will  increase her FOTO score to > 60 to demonstrate improved function at discharge (12/22/2014)   Time 6   Period Weeks   Status Unable to assess               Plan - 12/15/14 1546    Clinical Impression Statement Next visit is 12 out of 12. Pt. returned to work today and had no tingling noted during the full work day. She handled exercises with no increase in pain but is still having signifigant weakness in active shoulder flexion. She returns to the Dr. on Dec. 6th.   PT Next Visit Plan cont UBE and shoulder strengthening. Eccentric flexion        Problem List There are no active problems to display for this patient.  [name], [place] 12/15/2014 4:20 PM PHONE:[phone] [facility] [place], [place], [address] Phone: [phone]   Fax:  [phone]  Name: Brandy Delgado MRN: [id number] Date of Birth: 08/12/1945

## 2014-12-20 ENCOUNTER — Ambulatory Visit: Payer: Medicare Other | Attending: Orthopedic Surgery | Admitting: Physical Therapy

## 2014-12-20 DIAGNOSIS — R29898 Other symptoms and signs involving the musculoskeletal system: Secondary | ICD-10-CM | POA: Diagnosis not present

## 2014-12-20 DIAGNOSIS — M25611 Stiffness of right shoulder, not elsewhere classified: Secondary | ICD-10-CM

## 2014-12-20 DIAGNOSIS — M25511 Pain in right shoulder: Secondary | ICD-10-CM | POA: Insufficient documentation

## 2014-12-20 DIAGNOSIS — R293 Abnormal posture: Secondary | ICD-10-CM | POA: Diagnosis present

## 2014-12-20 NOTE — Therapy (Signed)
[place], [place], [address] Phone: [phone]   Fax:  [phone]  Physical Therapy Treatment / Re-certification  Patient Details  Name: Brandy Delgado MRN: [id number] Date of Birth: [birth year] Referring Provider: Dr Madlyn Frankel  Encounter Date: 12/20/2014      PT End of Session - 12/20/14 0835    Visit Number 12   Number of Visits 20   Date for PT Re-Evaluation 01/17/15   PT Start Time 0815   PT Stop Time 0900   PT Time Calculation (min) 45 min   Activity Tolerance Patient tolerated treatment well   Behavior During Therapy [facility] for tasks assessed/performed      Past Medical History  Diagnosis Date  . Thyroid disease     No past surgical history on file.  There were no vitals filed for this visit.  Visit Diagnosis:  Right arm weakness - Plan: PT plan of care cert/re-cert  Decreased ROM of right shoulder - Plan: PT plan of care cert/re-cert  Abnormal posture - Plan: PT plan of care cert/re-cert      Subjective Assessment - 12/20/14 0809    Subjective "I am still having trouble raising my arm, I have some tingling and numbnes in my last 3 fingers that is more intermittent in nature"  she reports no pain except for laying on it at night   Currently in Pain? Yes   Pain Score 0-No pain  4/10 this morning, took ibuprofen at 6:30   Pain Location Shoulder   Pain Orientation Right   Pain Descriptors / Indicators --  weakness   Pain Type Surgical pain   Pain Onset More than a month ago   Pain Frequency Intermittent   Aggravating Factors  laying on the shoulder,    Pain Relieving Factors getting out of position.            [facility] PT Assessment - 12/20/14 7026    Observation/Other Assessments   Focus on Therapeutic Outcomes (FOTO)  53% limited   AROM   Right Shoulder Extension 55 Degrees   Right Shoulder Flexion 68 Degrees  with some shoulder hiking   Right Shoulder ABduction 45  Degrees  with pulling sensation at proximal lateral humerus   Right Shoulder Internal Rotation 80 Degrees   Right Shoulder External Rotation 49 Degrees   PROM   Overall PROM  Within functional limits for tasks performed   Right/Left Shoulder Right;Left   Strength   Strength Assessment Site Hand   Right Shoulder Flexion 3-/5   Right Shoulder Extension 4/5   Right Shoulder ABduction 3-/5   Right Shoulder Internal Rotation 4/5   Right Shoulder External Rotation 4-/5   Right Hand Grip (lbs) 53.3  54,54,52   Left Hand Grip (lbs) 54.3  60,52,51   Palpation   Palpation comment tendnerness located at the middle deltoid and proxmial lateral humerus, as well as the lateral supraspinatus, and inferior infraspinatus/teresminor                     [facility] Adult PT Treatment/Exercise - 12/20/14 0001    Shoulder Exercises: ROM/Strengthening   UBE (Upper Arm Bike) L 2 x 8 min   Other ROM/Strengthening Exercises wall ladder flexion x 5 with 2 1/2 # weight with controlled eccentric lowering, Abduction 2 x 5 with no weight with controlled eccentric lowering.                  PT Education -  January 13, 2015 0919    Education provided Yes   Education Details reviewed HEP, education regarding shoulder musculature anatomy, and updated POC   Person(s) Educated Patient   Methods Explanation   Comprehension Verbalized understanding          PT Short Term Goals - 12/13/14 0853    PT SHORT TERM GOAL #1   Title pt will be I with inital HEP ( 12/01/2014)   Time 3   Period Weeks   Status Achieved   PT SHORT TERM GOAL #2   Title pt will be able to verbalize and demostrate techniques to reduce R shoulder reinjury via postural awarenss,lifting and carrying mechanics, and HEP (12/01/2014)   Baseline understands shoilder posture   Time 3   Period Weeks   Status Achieved           PT Long Term Goals - Jan 13, 2015 4562    PT LONG TERM GOAL #1   Title pt will be i with all HEP given  throughout therapy (12/22/2014)   Time 6   Period Weeks   Status On-going   PT LONG TERM GOAL #2   Title she will demonstrate improve shoulder AROM by >/= 20 degrees with flexion/ abduction to help with job related activities (12/22/2014)   Time 6   Period Weeks   Status On-going   PT LONG TERM GOAL #3   Title she will increase R shoulder strength to >/= 4/5 in all planes to assist with ADLs (12/22/2014)   Time 6   Period Weeks   Status On-going   PT LONG TERM GOAL #4   Title she will be able to lift and carry >/= 5# overhead with </= 2/10 pain to promote lifting associated with job related activities (12/22/2014)   Time 6   Period Weeks   Status Revised   PT LONG TERM GOAL #5   Title pt will increase her FOTO score to >/= 60 to demonstrate improved function at discharge (12/22/2014)   Time 6   Period Weeks   Status On-going               Plan - 01/13/2015 0911    Clinical Impression Statement Brandy Delgado has demonstrated some improvement with flexion to 68 degrees, and improved strength with shoulder extension. pt states that she is consistent with her HEP often 2 x a day. Abduction continues to demonstrate significant AROM secondary to weakness. She does report some pain in the lateral proximal Region.  She reports N/T into her 4th/5th digits that seems to be intermittent. Plan to continue for 4 more weeks to work toward increasing strength and met her remaining goals.    Pt will benefit from skilled therapeutic intervention in order to improve on the following deficits Improper body mechanics;Pain;Postural dysfunction;Decreased strength;Decreased mobility;Hypomobility;Decreased activity tolerance;Decreased endurance;Impaired UE functional use   Rehab Potential Good   PT Frequency 2x / week   PT Duration 4 weeks   PT Treatment/Interventions ADLs/Self Care Home Management;Cryotherapy;Electrical Stimulation;Moist Heat;Ultrasound;Therapeutic activities;Therapeutic exercise;Manual  techniques;Taping;Passive range of motion;Patient/family education   PT Next Visit Plan cont UBE and shoulder strengthening. Eccentric flexion, E-stim for muscle training? [place] over long head of the biceps tendon.   PT Home Exercise Plan reviewed HEP   Consulted and Agree with Plan of Care Patient          G-Codes - January 13, 2015 0907    Functional Assessment Tool Used FOTO 53% limited   Functional Limitation Carrying, moving and handling objects   Carrying, Moving  and Handling Objects Current Status [phone]) At least 60 percent but less than 80 percent impaired, limited or restricted   Carrying, Moving and Handling Objects Goal Status ([id number]) At least 20 percent but less than 40 percent impaired, limited or restricted      Problem List There are no active problems to display for this patient.  [name] PT, DPT, LAT, ATC  12/20/2014  9:21 AM     [facility] [address] [place], [place], [address] Phone: [phone]   Fax:  [phone]  Name: Brandy Delgado MRN: [id number] Date of Birth: 1945/11/20

## 2014-12-22 ENCOUNTER — Encounter: Payer: Medicare Other | Admitting: Physical Therapy

## 2014-12-22 ENCOUNTER — Ambulatory Visit: Payer: Medicare Other | Admitting: Physical Therapy

## 2014-12-22 DIAGNOSIS — M25611 Stiffness of right shoulder, not elsewhere classified: Secondary | ICD-10-CM

## 2014-12-22 DIAGNOSIS — R29898 Other symptoms and signs involving the musculoskeletal system: Secondary | ICD-10-CM | POA: Diagnosis not present

## 2014-12-22 DIAGNOSIS — R293 Abnormal posture: Secondary | ICD-10-CM

## 2014-12-22 NOTE — Therapy (Signed)
[name], [place], [address] Phone: [phone]   Fax:  [phone]  Physical Therapy Treatment  Patient Details  Name: Brandy Delgado MRN: [id number] Date of Birth: Jul 27, 1945 Referring Provider: Dr Madlyn Frankel  Encounter Date: 12/22/2014      PT End of Session - 12/22/14 1444    Visit Number 13   Number of Visits 20   Date for PT Re-Evaluation 01/17/15   PT Start Time [id number]   PT Stop Time 1520   PT Time Calculation (min) 45 min   Activity Tolerance Patient tolerated treatment well      Past Medical History  Diagnosis Date  . Thyroid disease     No past surgical history on file.  There were no vitals filed for this visit.  Visit Diagnosis:  Right arm weakness  Decreased ROM of right shoulder  Abnormal posture      Subjective Assessment - 12/22/14 1440    Subjective "I actually reached up and grabbed a towel out of the cabinet today and didn't even think about it." "the tingling is better lateley" " I am able to reach around and shampoo my hair now"   Currently in Pain? Yes   Pain Score 5    Pain Location Shoulder   Pain Orientation Right   Pain Type Surgical pain   Pain Onset More than a month ago   Pain Frequency Intermittent   Aggravating Factors  laying on the shoulder   Pain Relieving Factors ice, rest                         [facility] Adult PT Treatment/Exercise - 12/22/14 1454    Shoulder Exercises: Supine       Other Supine Exercises    Shoulder Exercises: Standing   Other Standing Exercises    Other Standing Exercises walk walks with 10 second holds with physioball; shoulder isometrics 10 second holds x 10 each direction   Shoulder Exercises: Pulleys   Flexion 3 minutes   ABduction 3 minutes   Other Pulley Exercises scaption 3 minutes   Other Pulley Exercises UE ranger flexion; horizontal abduction 20 reps each with  cuff weight   Shoulder Exercises:  Therapy Ball   Other Therapy Ball Exercises flexion up wall x 20 side to side 20 times   Shoulder Exercises: ROM/Strengthening   UBE (Upper Arm Bike) L 2 x 8 min  (4 forward 4 backward)   Other ROM/Strengthening Exercises wall ladder flexion x 10 with  with controlled eccentric lowering, Abduction 2 x 10 with no weight with controlled eccentric lowering.                    PT Short Term Goals - 12/13/14 [id number]    PT SHORT TERM GOAL #1   Title pt will be I with inital HEP ( 12/01/2014)   Time 3   Period Weeks   Status Achieved   PT SHORT TERM GOAL #2   Title pt will be able to verbalize and demostrate techniques to reduce R shoulder reinjury via postural awarenss,lifting and carrying mechanics, and HEP (12/01/2014)   Baseline understands shoilder posture   Time 3   Period Weeks   Status Achieved           PT Long Term Goals - 12/20/14 [id number]    PT LONG TERM GOAL #1   Title pt will be i with all HEP given  throughout therapy (12/22/2014)   Time 6   Period Weeks   Status On-going   PT LONG TERM GOAL #2   Title she will demonstrate improve shoulder AROM by >/= 20 degrees with flexion/ abduction to help with job related activities (12/22/2014)   Time 6   Period Weeks   Status On-going   PT LONG TERM GOAL #3   Title she will increase R shoulder strength to >/= 4/5 in all planes to assist with ADLs (12/22/2014)   Time 6   Period Weeks   Status On-going   PT LONG TERM GOAL #4   Title she will be able to lift and carry >/= 5# overhead with </= 2/10 pain to promote lifting associated with job related activities (12/22/2014)   Time 6   Period Weeks   Status Revised   PT LONG TERM GOAL #5   Title pt will increase her FOTO score to >/= 60 to demonstrate improved function at discharge (12/22/2014)   Time 6   Period Weeks   Status On-going               Plan - 12/22/14 1442    Clinical Impression Statement Patient presented with more pain in her shoulder secondary to  Dr. manipulating shoulder during examination yesterday. The doctor said that she will be able to lift her arm within the year and that the tear was so bad that it could not be surgically repaired, so it will just require therapy and time to heal. Treatment continued to focus on increasing ROM and strength in the R shoulder. Resistance and intensity of Tx was decreased due to increased pain from Dr.'s visit. Patient declined Ice and said she would do it at home after therapy.   PT Next Visit Plan cont UBE and shoulder strengthening. Eccentric flexion, E-stim for muscle training? [place] over long head of the biceps tendon.        Problem List There are no active problems to display for this patient.  [name], [place] 12/22/2014 3:19 PM [phone] [facility] [place], [place], [address] Phone: [phone]   Fax:  [phone]  Name: Brandy Delgado MRN: [id number] Date of Birth: [birth year]

## 2014-12-27 ENCOUNTER — Ambulatory Visit: Payer: Medicare Other | Admitting: Physical Therapy

## 2014-12-27 DIAGNOSIS — R29898 Other symptoms and signs involving the musculoskeletal system: Secondary | ICD-10-CM

## 2014-12-27 DIAGNOSIS — M25611 Stiffness of right shoulder, not elsewhere classified: Secondary | ICD-10-CM

## 2014-12-27 DIAGNOSIS — R293 Abnormal posture: Secondary | ICD-10-CM

## 2014-12-27 NOTE — Therapy (Signed)
[place], [place], [address] Phone: [phone]   Fax:  [phone]  Physical Therapy Treatment  Patient Details  Name: Brandy Delgado MRN: [id number] Date of Birth: 12-21-1945 Referring Provider: Dr Madlyn Frankel  Encounter Date: 12/27/2014      PT End of Session - 12/27/14 1229    Visit Number 14   Number of Visits 20   Date for PT Re-Evaluation 01/17/15   Authorization Type Medicare: Kx mod by 15th visit, progress note by 9th visit   PT Start Time 1115   PT Stop Time 1215   PT Time Calculation (min) 60 min   Activity Tolerance Patient tolerated treatment well   Behavior During Therapy [facility] for tasks assessed/performed      Past Medical History  Diagnosis Date  . Thyroid disease     No past surgical history on file.  There were no vitals filed for this visit.  Visit Diagnosis:  Right arm weakness  Decreased ROM of right shoulder  Abnormal posture      Subjective Assessment - 12/27/14 1127    Subjective pt reports that her function seems to fluctuate depending what she is doing.   Currently in Pain? Yes   Pain Score 0-No pain  took some advil at 8:45   Pain Location Shoulder   Pain Orientation Right   Pain Type Chronic pain   Pain Onset More than a month ago   Pain Frequency Intermittent   Aggravating Factors  laying on the shoulder,    Pain Relieving Factors ice, rest, ibuprofen.                          [place] Adult PT Treatment/Exercise - 12/27/14 1133    Shoulder Exercises: Supine   Other Supine Exercises circles, horizontal abduction add, 15 2 LBS triceps 2 LBS 15 X    Other Supine Exercises PNF D1 & D2 with manual resistance   Shoulder Exercises: Seated   Row AROM;Strengthening;Right;15 reps;Theraband   Theraband Level (Shoulder Row) Level 3 (Green)   Other Seated Exercises bicep curl 2x 10 with shoulder flexion at endrange (coricobrachialiss)  no  resistance concentrically, with resisted eccentrics   Other Seated Exercises bicep curls 5# 2 x 10   Shoulder Exercises: Sidelying   ABduction AROM;Strengthening;Right;Theraband;15 reps  no resistance concentrically, with resistance eccentrically   Theraband Level (Shoulder ABduction) Level 1 (Yellow)   Shoulder Exercises: ROM/Strengthening   UBE (Upper Arm Bike) L 1.5 x 10 min   Modalities   Modalities Electrical Stimulation;Ultrasound   Acupuncturist Location middle deltoid   Electrical Stimulation Action Russian   Electrical Stimulation Parameters 10/20 x 10 min, L 51  VC for pt to contract with Scientific laboratory technician                PT Education - 12/27/14 1228    Education provided Yes   Education Details HEP Review   Person(s) Educated Patient   Methods Explanation   Comprehension Verbalized understanding          PT Short Term Goals - 12/13/14 0853    PT SHORT TERM GOAL #1   Title pt will be I with inital HEP ( 12/01/2014)   Time 3   Period Weeks   Status Achieved   PT SHORT TERM GOAL #2   Title pt will be able to verbalize and demostrate techniques to  reduce R shoulder reinjury via postural awarenss,lifting and carrying mechanics, and HEP (12/01/2014)   Baseline understands shoilder posture   Time 3   Period Weeks   Status Achieved           PT Long Term Goals - 12/20/14 [id number]    PT LONG TERM GOAL #1   Title pt will be i with all HEP given throughout therapy (12/22/2014)   Time 6   Period Weeks   Status On-going   PT LONG TERM GOAL #2   Title she will demonstrate improve shoulder AROM by >/= 20 degrees with flexion/ abduction to help with job related activities (12/22/2014)   Time 6   Period Weeks   Status On-going   PT LONG TERM GOAL #3   Title she will increase R shoulder strength to >/= 4/5 in all planes to assist with ADLs (12/22/2014)   Time 6   Period Weeks   Status On-going    PT LONG TERM GOAL #4   Title she will be able to lift and carry >/= 5# overhead with </= 2/10 pain to promote lifting associated with job related activities (12/22/2014)   Time 6   Period Weeks   Status Revised   PT LONG TERM GOAL #5   Title pt will increase her FOTO score to >/= 60 to demonstrate improved function at discharge (12/22/2014)   Time 6   Period Weeks   Status On-going               Plan - 12/27/14 1229    Clinical Impression Statement pt continues to demonstrate weakness int he R shoulder in all planes with abduction being the most significant. She reported that the [place] e-stim helped to get more motion in her R shoulder and reported no pain during or following the exercise. she complete all exercises without compliant except for feeling weak. pt declined need for ice following todays session.    PT Next Visit Plan Review response to russian e-stim. cont UBE and shoulder strengthening. Eccentric flexion/abduction, Kx modifier starting next visit.    PT Home Exercise Plan reviewed HEP   Consulted and Agree with Plan of Care Patient        Problem List There are no active problems to display for this patient.  [name] PT, DPT, LAT, ATC  12/27/2014  12:34 PM      [facility] [address] [place], [place], [address] Phone: [phone]   Fax:  [phone]  Name: Brandy Delgado MRN: [id number] Date of Birth: [birth year]

## 2014-12-29 ENCOUNTER — Ambulatory Visit: Payer: Medicare Other | Admitting: Physical Therapy

## 2014-12-29 DIAGNOSIS — R293 Abnormal posture: Secondary | ICD-10-CM

## 2014-12-29 DIAGNOSIS — R29898 Other symptoms and signs involving the musculoskeletal system: Secondary | ICD-10-CM | POA: Diagnosis not present

## 2014-12-29 DIAGNOSIS — M25611 Stiffness of right shoulder, not elsewhere classified: Secondary | ICD-10-CM

## 2014-12-29 NOTE — Therapy (Signed)
[place], [place], [address] Phone: [phone]   Fax:  [phone]  Physical Therapy Treatment  Patient Details  Name: Brandy Delgado MRN: [id number] Date of Birth: 09-May-1945 Referring Provider: Dr Madlyn Frankel  Encounter Date: 12/29/2014      PT End of Session - 12/29/14 1429    Visit Number 15   Number of Visits 20   Date for PT Re-Evaluation 01/17/15   Authorization Type Medicare: Kx mod by 15th visit, progress note by 9th visit   PT Start Time 1330   PT Stop Time 1415   PT Time Calculation (min) 45 min   Equipment Utilized During Treatment Gait belt   Activity Tolerance Patient tolerated treatment well      Past Medical History  Diagnosis Date  . Thyroid disease     No past surgical history on file.  There were no vitals filed for this visit.  Visit Diagnosis:  Right arm weakness  Decreased ROM of right shoulder  Abnormal posture      Subjective Assessment - 12/29/14 1334    Subjective "I am doing well today, I had some sorness after last visit which was controlled with ice"   Currently in Pain? Yes   Pain Score 0-No pain   Pain Location Shoulder   Pain Orientation Right   Pain Type Chronic pain   Pain Onset More than a month ago                         [facility] Adult PT Treatment/Exercise - 12/29/14 1342    Shoulder Exercises: Seated   Other Seated Exercises UE ranger with horizontal x 15 eaabduction/ adduction with red theraband  stirring pot with yellow TB resistance   Shoulder Exercises: Standing   Flexion AROM;Strengthening;Right;Theraband;10 reps  2 x 10   Theraband Level (Shoulder Flexion) Level 1 (Yellow)   Extension AROM;Strengthening;Both;15 reps;Theraband   Theraband Level (Shoulder Extension) Level 4 (Blue)   Row AROM;Strengthening;15 reps;Theraband   Theraband Level (Shoulder Row) Level 4 (Blue)   Shoulder Exercises: ROM/Strengthening   UBE  (Upper Arm Bike) L 3 x 6 min  changin    Acupuncturist Location middle deltoid   Chartered certified accountant Russian   Electrical Stimulation Parameters 10/20 x 15 minutes, 2 sec ramp, 100 PPS   Electrical Stimulation Goals Strength  in sitting                PT Education - 12/29/14 1428    Education provided Yes   Education Details eccentric exercies with standing shoulder exercises.    Person(s) Educated Patient   Methods Explanation   Comprehension Verbalized understanding          PT Short Term Goals - 12/13/14 0853    PT SHORT TERM GOAL #1   Title pt will be I with inital HEP ( 12/01/2014)   Time 3   Period Weeks   Status Achieved   PT SHORT TERM GOAL #2   Title pt will be able to verbalize and demostrate techniques to reduce R shoulder reinjury via postural awarenss,lifting and carrying mechanics, and HEP (12/01/2014)   Baseline understands shoilder posture   Time 3   Period Weeks   Status Achieved           PT Long Term Goals - 12/20/14 [id number]    PT LONG TERM GOAL #1   Title pt will be i  with all HEP given throughout therapy (12/22/2014)   Time 6   Period Weeks   Status On-going   PT LONG TERM GOAL #2   Title she will demonstrate improve shoulder AROM by >/= 20 degrees with flexion/ abduction to help with job related activities (12/22/2014)   Time 6   Period Weeks   Status On-going   PT LONG TERM GOAL #3   Title she will increase R shoulder strength to >/= 4/5 in all planes to assist with ADLs (12/22/2014)   Time 6   Period Weeks   Status On-going   PT LONG TERM GOAL #4   Title she will be able to lift and carry >/= 5# overhead with </= 2/10 pain to promote lifting associated with job related activities (12/22/2014)   Time 6   Period Weeks   Status Revised   PT LONG TERM GOAL #5   Title pt will increase her FOTO score to >/= 60 to demonstrate improved function at discharge (12/22/2014)   Time 6   Period Weeks    Status On-going               Plan - 12/29/14 1430    Clinical Impression Statement Auralia continues to demonstrate limited shoulder mobility. She reported the [place] e-stim helped last time so progress to doing the e-stim in sitting and progressing the time. She was able to perform and complete all exercises wihtout complaint stating she felt like she had accomplished some work.    PT Next Visit Plan  response to russian e-stim. cont UBE and shoulder strengthening. Eccentric flexion/abduction, Kx modifier starting next visit.    PT Home Exercise Plan reviewed HEP   Consulted and Agree with Plan of Care Patient        Problem List There are no active problems to display for this patient.  [name] PT, DPT, LAT, ATC  12/29/2014  2:38 PM      [facility] [address] [place], [place], [address] Phone: [phone]   Fax:  [phone]  Name: Brandy Delgado MRN: [id number] Date of Birth: October 19, 1945

## 2015-01-03 ENCOUNTER — Ambulatory Visit: Payer: Medicare Other | Admitting: Physical Therapy

## 2015-01-03 DIAGNOSIS — R29898 Other symptoms and signs involving the musculoskeletal system: Secondary | ICD-10-CM | POA: Diagnosis not present

## 2015-01-03 DIAGNOSIS — M25611 Stiffness of right shoulder, not elsewhere classified: Secondary | ICD-10-CM

## 2015-01-03 DIAGNOSIS — R293 Abnormal posture: Secondary | ICD-10-CM

## 2015-01-03 NOTE — Therapy (Signed)
[place], [place], [address] Phone: [phone]   Fax:  [phone]  Physical Therapy Treatment  Patient Details  Name: Brandy Delgado MRN: [id number] Date of Birth: [birth year] Referring Provider: Dr Madlyn Frankel  Encounter Date: 01/03/2015      PT End of Session - 01/03/15 1257    Visit Number 16   Number of Visits 20   Date for PT Re-Evaluation 01/17/15   Authorization Type Medicare: Kx mod by 15th visit, progress note by 9th visit   PT Start Time 1145   PT Stop Time 1232   PT Time Calculation (min) 47 min   Activity Tolerance Patient tolerated treatment well   Behavior During Therapy [facility] for tasks assessed/performed      Past Medical History  Diagnosis Date  . Thyroid disease     No past surgical history on file.  There were no vitals filed for this visit.  Visit Diagnosis:  Right arm weakness  Decreased ROM of right shoulder  Abnormal posture      Subjective Assessment - 01/03/15 1153    Subjective " I feel like I am getting more motion out of my shoulder and strength to flex it"   Currently in Pain? No/denies   Pain Orientation Right   Pain Type Chronic pain   Pain Onset More than a month ago   Pain Frequency Intermittent                         [facility] Adult PT Treatment/Exercise - 01/03/15 1256    Shoulder Exercises: Supine   Other Supine Exercises circles, horizontal abduction add, 15 2 LBS triceps 2 LBS 15 X, bicep curl with transition to shoulder protraction with 4# weight.    Other Supine Exercises PNF D1 & D2 with manual resistance   Shoulder Exercises: Seated   Row AROM;Strengthening;Right;15 reps;Theraband   Theraband Level (Shoulder Row) Level 3 (Green)   Acupuncturist Location middle deltoid   Scientist, research (life sciences) Parameters 10/10 x 20 min, L 22, 2 sec ramp   Special educational needs teacher                PT Education - 01/03/15 1257    Education provided No          PT Short Term Goals - 12/13/14 0853    PT SHORT TERM GOAL #1   Title pt will be I with inital HEP ( 12/01/2014)   Time 3   Period Weeks   Status Achieved   PT SHORT TERM GOAL #2   Title pt will be able to verbalize and demostrate techniques to reduce R shoulder reinjury via postural awarenss,lifting and carrying mechanics, and HEP (12/01/2014)   Baseline understands shoilder posture   Time 3   Period Weeks   Status Achieved           PT Long Term Goals - 12/20/14 [id number]    PT LONG TERM GOAL #1   Title pt will be i with all HEP given throughout therapy (12/22/2014)   Time 6   Period Weeks   Status On-going   PT LONG TERM GOAL #2   Title she will demonstrate improve shoulder AROM by >/= 20 degrees with flexion/ abduction to help with job related activities (12/22/2014)   Time 6   Period Weeks   Status On-going   PT LONG TERM  GOAL #3   Title she will increase R shoulder strength to >/= 4/5 in all planes to assist with ADLs (12/22/2014)   Time 6   Period Weeks   Status On-going   PT LONG TERM GOAL #4   Title she will be able to lift and carry >/= 5# overhead with </= 2/10 pain to promote lifting associated with job related activities (12/22/2014)   Time 6   Period Weeks   Status Revised   PT LONG TERM GOAL #5   Title pt will increase her FOTO score to >/= 60 to demonstrate improved function at discharge (12/22/2014)   Time 6   Period Weeks   Status On-going               Plan - 01/03/15 1258    Clinical Impression Statement Dashly reported that she feels like the e-stim has really helped and that she would like to continue with it. [place]oll[place] e-stim she was able to complete resisted exercises in supine better but still demonstrates some fatigue with prolonged exercises. Plan to progress with strengthening as tolerated.    PT Next Visit Plan continue  russian e-stim for shoulder strengthening. cont UBE and shoulder strengthening. Eccentric flexion/abduction, Kx modifier   PT Home Exercise Plan no new HEP given   Consulted and Agree with Plan of Care Patient        Problem List There are no active problems to display for this patient.  [name] PT, DPT, LAT, ATC  01/03/2015  1:03 PM     [facility] [address] [place], [place], [address] Phone: [phone]   Fax:  [phone]  Name: Brandy Delgado MRN: [id number] Date of Birth: April 06, 1945

## 2015-01-05 ENCOUNTER — Ambulatory Visit: Payer: Medicare Other | Admitting: Physical Therapy

## 2015-01-05 DIAGNOSIS — M25611 Stiffness of right shoulder, not elsewhere classified: Secondary | ICD-10-CM

## 2015-01-05 DIAGNOSIS — R29898 Other symptoms and signs involving the musculoskeletal system: Secondary | ICD-10-CM

## 2015-01-05 DIAGNOSIS — R293 Abnormal posture: Secondary | ICD-10-CM

## 2015-01-05 NOTE — Therapy (Signed)
[place], [place], [address] Phone: [phone]   Fax:  [phone]  Physical Therapy Treatment  Patient Details  Name: Brandy Delgado MRN: [id number] Date of Birth: 1946/01/14 Referring Provider: Dr Madlyn Frankel  Encounter Date: 01/05/2015      PT End of Session - 01/05/15 1537    Visit Number 17   Date for PT Re-Evaluation 01/17/15   Authorization Type Medicare: Kx mod by 15th visit, progress note by 9th visit   PT Start Time 0330   PT Stop Time 0425   PT Time Calculation (min) 55 min      Past Medical History  Diagnosis Date  . Thyroid disease     No past surgical history on file.  There were no vitals filed for this visit.  Visit Diagnosis:  Right arm weakness  Decreased ROM of right shoulder  Abnormal posture      Subjective Assessment - 01/05/15 1629    Subjective I am tired from doing to highlights today   Currently in Pain? No/denies            [facility] PT Assessment - 01/05/15 1631    AROM   Right Shoulder Flexion 85 Degrees   Right Shoulder ABduction 45 Degrees                     [facility] Adult PT Treatment/Exercise - 01/05/15 1632    Shoulder Exercises: Seated   Other Seated Exercises Seated on high table and seated in chair with arm rests- worked on scapular depression, also green band wrapped around elbow and shoulder with pressing into resisted scap depresson added to HEP    Other Seated Exercises UE ranger for AAROM flexion and horizontal adct/addct x 25 each, then standing cane flexion focusing eccentric lowering using UE ranger detached from wall.                 PT Education - 01/05/15 1641    Education provided Yes   Education Details scapular depression with green band and cloed chain using chair   Person(s) Educated Patient   Methods Explanation;Demonstration   Comprehension Verbalized understanding;Returned demonstration          PT  Short Term Goals - 12/13/14 0853    PT SHORT TERM GOAL #1   Title pt will be I with inital HEP ( 12/01/2014)   Time 3   Period Weeks   Status Achieved   PT SHORT TERM GOAL #2   Title pt will be able to verbalize and demostrate techniques to reduce R shoulder reinjury via postural awarenss,lifting and carrying mechanics, and HEP (12/01/2014)   Baseline understands shoilder posture   Time 3   Period Weeks   Status Achieved           PT Long Term Goals - 01/05/15 1628    PT LONG TERM GOAL #1   Title pt will be i with all HEP given throughout therapy (12/22/2014)   Time 6   Period Weeks   Status On-going   PT LONG TERM GOAL #2   Title she will demonstrate improve shoulder AROM by >/= 20 degrees with flexion/ abduction to help with job related activities (12/22/2014)   Time 6   Period Weeks   Status Partially Met   PT LONG TERM GOAL #3   Title she will increase R shoulder strength to >/= 4/5 in all planes to assist with ADLs (12/22/2014)   Time  6   Period Weeks   Status On-going   PT LONG TERM GOAL #4   Title she will be able to lift and carry >/= 5# overhead with </= 2/10 pain to promote lifting associated with job related activities (12/22/2014)   Time 6   Period Weeks   Status Revised   PT LONG TERM GOAL #5   Title pt will increase her FOTO score to >/= 60 to demonstrate improved function at discharge (12/22/2014)   Time 6   Period Weeks   Status On-going               Plan - 01/05/15 1636    Clinical Impression Statement Instucted pt in scapular depression strengthening exercises in closed chain via lifting bottom out of chair and holding with scapula depressed as well as green theraband scap depression and added to HEP.    PT Next Visit Plan continue russian e-stim for shoulder strengthening. cont UBE and shoulder strengthening. Eccentric flexion/abduction, Kx modifier- check scap depression exercises        Problem List There are no active problems to  display for this patient.   Dorene Ar 01/05/2015, 4:42 PM  [facility] [address] [place], [place], [address] Phone: [phone]   Fax:  [phone]  Name: Brandy Delgado MRN: [id number] Date of Birth: [birth year]

## 2015-01-12 ENCOUNTER — Ambulatory Visit: Payer: Medicare Other | Admitting: Physical Therapy

## 2015-01-12 DIAGNOSIS — R293 Abnormal posture: Secondary | ICD-10-CM

## 2015-01-12 DIAGNOSIS — R29898 Other symptoms and signs involving the musculoskeletal system: Secondary | ICD-10-CM | POA: Diagnosis not present

## 2015-01-12 DIAGNOSIS — M25611 Stiffness of right shoulder, not elsewhere classified: Secondary | ICD-10-CM

## 2015-01-12 NOTE — Therapy (Signed)
[place], [place], [address] Phone: [phone]   Fax:  [phone]  Physical Therapy Treatment  Patient Details  Name: Brandy Delgado MRN: [id number] Date of Birth: 1945/07/20 Referring Provider: Dr Madlyn Frankel  Encounter Date: 01/12/2015      PT End of Session - 01/12/15 1722    Visit Number 18   Number of Visits 20   Date for PT Re-Evaluation 01/17/15   Authorization Type Medicare: Kx mod by 15th visit, progress note by 9th visit   PT Start Time 1630   PT Stop Time 1715   PT Time Calculation (min) 45 min   Activity Tolerance Patient tolerated treatment well   Behavior During Therapy [facility] for tasks assessed/performed      Past Medical History  Diagnosis Date  . Thyroid disease     No past surgical history on file.  There were no vitals filed for this visit.  Visit Diagnosis:  Right arm weakness  Decreased ROM of right shoulder  Abnormal posture      Subjective Assessment - 01/12/15 1627    Subjective "sometimes I feel like I am getting stronger and other times I feel the same"   Currently in Pain? No/denies                         [facility] Adult PT Treatment/Exercise - 01/12/15 1726    Shoulder Exercises: Supine   Horizontal ABduction AROM;Strengthening;Both;20 reps;Theraband   Theraband Level (Shoulder Horizontal ABduction) Level 2 (Red)   Other Supine Exercises circles, horizontal abduction add, 15 2 LBS triceps 2 LBS 15 X, bicep curl with transition to shoulder protraction with 4# weight.    Other Supine Exercises PNF D1 & D2 with manual resistance,    Shoulder Exercises: Sidelying   ABduction Strengthening;Right;Theraband;20 reps  controlled eccentric lowering    Theraband Level (Shoulder ABduction) Level 1 (Yellow)   Shoulder Exercises: Standing   Row AROM;Strengthening;15 reps;Theraband   Theraband Level (Shoulder Row) Level 4 (Blue)   Other Standing Exercises  wall plank with shoulder protraction/retraction 2 x 10   Electrical Stimulation   Electrical Stimulation Location middle deltoid   Electrical Stimulation Action Russian   Electrical Stimulation Parameters 10/10, 100pps, 2 sec ramp, L 25 x 20 min   Electrical Stimulation Goals Strength                PT Education - 01/12/15 1722    Education provided Yes   Education Details updated HEP   Person(s) Educated Patient   Methods Explanation   Comprehension Verbalized understanding          PT Short Term Goals - 12/13/14 0853    PT SHORT TERM GOAL #1   Title pt will be I with inital HEP ( 12/01/2014)   Time 3   Period Weeks   Status Achieved   PT SHORT TERM GOAL #2   Title pt will be able to verbalize and demostrate techniques to reduce R shoulder reinjury via postural awarenss,lifting and carrying mechanics, and HEP (12/01/2014)   Baseline understands shoilder posture   Time 3   Period Weeks   Status Achieved           PT Long Term Goals - 01/05/15 1628    PT LONG TERM GOAL #1   Title pt will be i with all HEP given throughout therapy (12/22/2014)   Time 6   Period Weeks   Status On-going  PT LONG TERM GOAL #2   Title she will demonstrate improve shoulder AROM by >/= 20 degrees with flexion/ abduction to help with job related activities (12/22/2014)   Time 6   Period Weeks   Status Partially Met   PT LONG TERM GOAL #3   Title she will increase R shoulder strength to >/= 4/5 in all planes to assist with ADLs (12/22/2014)   Time 6   Period Weeks   Status On-going   PT LONG TERM GOAL #4   Title she will be able to lift and carry >/= 5# overhead with </= 2/10 pain to promote lifting associated with job related activities (12/22/2014)   Time 6   Period Weeks   Status Revised   PT LONG TERM GOAL #5   Title pt will increase her FOTO score to >/= 60 to demonstrate improved function at discharge (12/22/2014)   Time 6   Period Weeks   Status On-going                Plan - 01/12/15 1722    Clinical Impression Statement Valeree continues to exhibit frustration with the shoulder due to the weakness. She is able to perform eccentric sidelying exercises but is unable to perform full concentric exercises against gravity due to weakness. Continued [place] which she reports helps but seems like she isn't getting her shoulder as high as she thought she could.    PT Next Visit Plan continue russian e-stim for shoulder strengthening. cont UBE and shoulder strengthening. Eccentric flexion/abduction, Kx modifier- check scap depression exercises, Renewal Vs, Discharge?   Consulted and Agree with Plan of Care Patient        Problem List There are no active problems to display for this patient.  [name] PT, DPT, LAT, ATC  01/12/2015  5:31 PM     [facility] [address] [place], [place], [address] Phone: [phone]   Fax:  [phone]  Name: Brandy Delgado MRN: [id number] Date of Birth: 01-02-1946

## 2015-01-19 ENCOUNTER — Ambulatory Visit: Payer: Medicare Other | Attending: Orthopedic Surgery | Admitting: Physical Therapy

## 2015-01-19 DIAGNOSIS — M25511 Pain in right shoulder: Secondary | ICD-10-CM | POA: Insufficient documentation

## 2015-01-19 DIAGNOSIS — M25611 Stiffness of right shoulder, not elsewhere classified: Secondary | ICD-10-CM

## 2015-01-19 DIAGNOSIS — R293 Abnormal posture: Secondary | ICD-10-CM | POA: Diagnosis present

## 2015-01-19 DIAGNOSIS — R29898 Other symptoms and signs involving the musculoskeletal system: Secondary | ICD-10-CM | POA: Diagnosis present

## 2015-01-19 NOTE — Patient Instructions (Signed)
Flexing shoulder to 90 degrees while holding on to bad that is tied off on door. Take 3 steps forward and slowly control lowering of the arm. Take 3 steps back and flex shoulder again and repeat. Perform 3 x 10

## 2015-01-19 NOTE — Therapy (Signed)
[place], [place], [address] Phone: [phone]   Fax:  [phone]  Physical Therapy Treatment / Re-certification  Patient Details  Name: Brandy Delgado MRN: [id number] Date of Birth: 01/26/1945 Referring Provider: Dr Madlyn Frankel  Encounter Date: 01/19/2015      PT End of Session - 01/19/15 1720    Visit Number 19   Number of Visits 23   Date for PT Re-Evaluation 02/16/15   Authorization Type Medicare: Kx mod by 15th visit, progress note by 9th visit   PT Start Time 1630   PT Stop Time 1715   PT Time Calculation (min) 45 min   Activity Tolerance Patient tolerated treatment well   Behavior During Therapy [facility] for tasks assessed/performed      Past Medical History  Diagnosis Date  . Thyroid disease     No past surgical history on file.  There were no vitals filed for this visit.  Visit Diagnosis:  Right arm weakness - Plan: PT plan of care cert/re-cert  Decreased ROM of right shoulder - Plan: PT plan of care cert/re-cert  Abnormal posture - Plan: PT plan of care cert/re-cert      Subjective Assessment - 01/19/15 1632    Subjective "I saw the doctor and he released me from care and told me that  it was up to my therapist on how much more I needed"    Currently in Pain? No/denies            [facility] PT Assessment - 01/19/15 1638    Observation/Other Assessments   Focus on Therapeutic Outcomes (FOTO)  45% limited   AROM   Right Shoulder Extension 60 Degrees   Right Shoulder Flexion 100 Degrees   Right Shoulder ABduction 60 Degrees   Strength   Right Shoulder Flexion 3-/5   Right Shoulder Extension 4+/5   Right Shoulder ABduction 3-/5   Right Shoulder Internal Rotation 4/5   Right Shoulder External Rotation 4-/5   Right Hand Grip (lbs) 49.3  51,50,47                     [facility] Adult PT Treatment/Exercise - 01/19/15 0001    Shoulder Exercises: Standing   Row  AROM;Strengthening;15 reps;Theraband   Theraband Level (Shoulder Row) Level 4 (Blue)   Other Standing Exercises shoulder flexion taking step forward with red theraband and controlling eccentric lowering 2 x 10   Other Standing Exercises wall plank with shoulder protraction/retraction 2 x 10   Electrical Stimulation   Electrical Stimulation Location middle deltoid   Electrical Stimulation Action Russian    Electrical Stimulation Parameters 10/10, 2 sec ramp, L 22 x 15 min   Electrical Stimulation Goals Strength                PT Education - 01/19/15 1719    Education provided Yes   Education Details reviewed and updated HEP   Person(s) Educated Patient   Methods Explanation   Comprehension Verbalized understanding          PT Short Term Goals - 12/13/14 0853    PT SHORT TERM GOAL #1   Title pt will be I with inital HEP ( 12/01/2014)   Time 3   Period Weeks   Status Achieved   PT SHORT TERM GOAL #2   Title pt will be able to verbalize and demostrate techniques to reduce R shoulder reinjury via postural awarenss,lifting and carrying mechanics, and HEP (12/01/2014)  Baseline understands shoilder posture   Time 3   Period Weeks   Status Achieved           PT Long Term Goals - 01-20-15 1643    PT LONG TERM GOAL #1   Title pt will be i with all HEP given throughout therapy (12/22/2014)   Time 6   Period Weeks   Status On-going   PT LONG TERM GOAL #2   Title she will demonstrate improve shoulder AROM by >/= 20 degrees with flexion/ abduction to help with job related activities (12/22/2014)   Time 6   Period Weeks   Status Partially Met   PT LONG TERM GOAL #3   Title she will increase R shoulder strength to >/= 4/5 in all planes to assist with ADLs (12/22/2014)   Period Weeks   Status On-going   PT LONG TERM GOAL #4   Title she will be able to lift and carry >/= 5# overhead with </= 2/10 pain to promote lifting associated with job related activities (12/22/2014)    Time 6   Period Weeks   Status On-going   PT LONG TERM GOAL #5   Title pt will increase her FOTO score to >/= 60 to demonstrate improved function at discharge (12/22/2014)   Time 6   Period Weeks   Status On-going               Plan - 01/20/15 1721    Clinical Impression Statement Brandy Delgado has demonstrated some improvement with therapy exhibiting increased shoulder flexion and abduction. Strength continues to be limited due with no pain upon assessment. She reports she feels she wouldn't have gottne as far without the help of the [place] stimulation. plan to conintue with current plan of care with 1 x a week for the next 4 weeks to progress toward remaining goals and independent exercises.    Pt will benefit from skilled therapeutic intervention in order to improve on the following deficits Improper body mechanics;Pain;Postural dysfunction;Decreased strength;Decreased mobility;Hypomobility;Decreased activity tolerance;Decreased endurance;Impaired UE functional use   Rehab Potential Good   PT Frequency 1x / week   PT Duration 4 weeks   PT Treatment/Interventions ADLs/Self Care Home Management;Cryotherapy;Electrical Stimulation;Moist Heat;Ultrasound;Therapeutic activities;Therapeutic exercise;Manual techniques;Taping;Passive range of motion;Patient/family education   PT Next Visit Plan continue russian e-stim for shoulder strengthening. cont UBE and shoulder strengthening. Eccentric flexion/abduction, Kx modifier-    Consulted and Agree with Plan of Care Patient          G-Codes - January 20, 2015 1647    Functional Assessment Tool Used FOTO 45%    Functional Limitation Carrying, moving and handling objects   Carrying, Moving and Handling Objects Current Status (D6222) At least 40 percent but less than 60 percent impaired, limited or restricted   Carrying, Moving and Handling Objects Goal Status (L7989) At least 20 percent but less than 40 percent impaired, limited or restricted       Problem List There are no active problems to display for this patient.  [name] PT, DPT, LAT, ATC  01-20-15  5:28 PM     [facility] [address] [place], [place], [address] Phone: [phone]   Fax:  [phone]  Name: ASHER BABILONIA MRN: [id number] Date of Birth: September 12, 1945

## 2015-01-24 ENCOUNTER — Ambulatory Visit: Payer: Medicare Other | Admitting: Physical Therapy

## 2015-01-26 ENCOUNTER — Encounter: Payer: Medicare Other | Admitting: Physical Therapy

## 2015-01-31 ENCOUNTER — Ambulatory Visit: Payer: Medicare Other | Admitting: Physical Therapy

## 2015-01-31 DIAGNOSIS — M25611 Stiffness of right shoulder, not elsewhere classified: Secondary | ICD-10-CM

## 2015-01-31 DIAGNOSIS — R29898 Other symptoms and signs involving the musculoskeletal system: Secondary | ICD-10-CM | POA: Diagnosis not present

## 2015-01-31 DIAGNOSIS — R293 Abnormal posture: Secondary | ICD-10-CM

## 2015-01-31 NOTE — Therapy (Signed)
[place], [place], [address] Phone: [phone]   Fax:  [phone]  Physical Therapy Treatment  Patient Details  Name: Brandy Delgado MRN: [id number] Date of Birth: 1945-03-18 Referring Provider: Dr Madlyn Frankel  Encounter Date: 01/31/2015      PT End of Session - 01/31/15 0847    Visit Number 20   Number of Visits 23   Date for PT Re-Evaluation 02/16/15   Authorization Type Medicare: Kx mod by 15th visit, progress note by 9th visit   PT Start Time 0800   PT Stop Time 0845   PT Time Calculation (min) 45 min   Activity Tolerance Patient tolerated treatment well   Behavior During Therapy [facility] for tasks assessed/performed      Past Medical History  Diagnosis Date  . Thyroid disease     No past surgical history on file.  There were no vitals filed for this visit.  Visit Diagnosis:  Right arm weakness  Decreased ROM of right shoulder  Abnormal posture      Subjective Assessment - 01/31/15 0756    Subjective "I was able to reach my radio this morning"   Currently in Pain? No/denies                         [facility] Adult PT Treatment/Exercise - 01/31/15 0806    Shoulder Exercises: Supine   Other Supine Exercises circles, horizontal abduction add, 15 2 LBS triceps 2 LBS 15 X, bicep curl with transition to shoulder protraction with 4# weight.    Other Supine Exercises supine shoulder miliatary press with red/ yellow band 2 x 10, moving in abduciton with controlled eccentric lowering 2 x 10    Shoulder Exercises: Seated   Other Seated Exercises chair dips pressing from arm rest x 15   Shoulder Exercises: ROM/Strengthening   UBE (Upper Arm Bike) L3 x 6 min  changing speed from fast to slow every 30 sec   Electrical Stimulation   Electrical Stimulation Location middle deltoid   Electrical Stimulation Action Russian   Electrical Stimulation Parameters 10/10 2 sec ramp, L 31 x 15 min    Electrical Stimulation Goals Strength                PT Education - 01/31/15 [phone]    Education provided Yes   Education Details updated HEP   Person(s) Educated Patient   Methods Explanation   Comprehension Verbalized understanding          PT Short Term Goals - 12/13/14 0853    PT SHORT TERM GOAL #1   Title pt will be I with inital HEP ( 12/01/2014)   Time 3   Period Weeks   Status Achieved   PT SHORT TERM GOAL #2   Title pt will be able to verbalize and demostrate techniques to reduce R shoulder reinjury via postural awarenss,lifting and carrying mechanics, and HEP (12/01/2014)   Baseline understands shoilder posture   Time 3   Period Weeks   Status Achieved           PT Long Term Goals - 01/31/15 [id number]    PT LONG TERM GOAL #1   Title pt will be i with all HEP given throughout therapy (12/22/2014)   Time 6   Period Weeks   Status On-going   PT LONG TERM GOAL #2   Title she will demonstrate improve shoulder AROM by >/= 20 degrees with flexion/  abduction to help with job related activities (12/22/2014)   Time 6   Period Weeks   Status On-going   PT LONG TERM GOAL #3   Title she will increase R shoulder strength to >/= 4/5 in all planes to assist with ADLs (12/22/2014)   Time 6   Period Weeks   Status On-going   PT LONG TERM GOAL #4   Title she will be able to lift and carry >/= 5# overhead with </= 2/10 pain to promote lifting associated with job related activities (12/22/2014)   Period Weeks   Status On-going   PT LONG TERM GOAL #5   Title pt will increase her FOTO score to >/= 60 to demonstrate improved function at discharge (12/22/2014)   Time 6   Period Weeks   Status On-going               Plan - 01/31/15 0848    Clinical Impression Statement Amandalee continues to report improvement with activities she is able to do at home such as reaching her radio in the car without difficulty. Continuing to utilize [place] e-stim for strengthening, and  eccentric strengthening in supine with gravity eliminated due to pt unable to get above 90 degrees of flexion and abduction. plan to continue strenthening and exercise to work toward goals and home exercise program.    PT Next Visit Plan continue russian e-stim for shoulder strengthening. cont UBE and shoulder strengthening. Eccentric flexion/abduction, Kx modifier-    PT Home Exercise Plan chair dips    Consulted and Agree with Plan of Care Patient        Problem List There are no active problems to display for this patient.  [name] PT, DPT, LAT, ATC  01/31/2015  8:53 AM     [facility] [address] [place], [place], [address] Phone: [phone]   Fax:  [phone]  Name: Brandy Delgado MRN: [id number] Date of Birth: 17-Jul-1945

## 2015-01-31 NOTE — Patient Instructions (Signed)
   Kristoffer Leamon PT, DPT, LAT, ATC  [facility] Phone: [phone]     

## 2015-02-07 ENCOUNTER — Ambulatory Visit: Payer: Medicare Other | Admitting: Physical Therapy

## 2015-02-07 DIAGNOSIS — R29898 Other symptoms and signs involving the musculoskeletal system: Secondary | ICD-10-CM

## 2015-02-07 DIAGNOSIS — M25611 Stiffness of right shoulder, not elsewhere classified: Secondary | ICD-10-CM

## 2015-02-07 DIAGNOSIS — R293 Abnormal posture: Secondary | ICD-10-CM

## 2015-02-07 NOTE — Therapy (Signed)
[place], [place], [address] Phone: [phone]   Fax:  [phone]  Physical Therapy Treatment  Patient Details  Name: Brandy Delgado MRN: [id number] Date of Birth: Dec 16, 1945 Referring Provider: Dr Madlyn Frankel  Encounter Date: 02/07/2015      PT End of Session - 02/07/15 0839    Visit Number 21   Number of Visits 23   Date for PT Re-Evaluation 02/16/15   Authorization Type Medicare: Kx mod by 15th visit, progress note by 9th visit   PT Start Time 0800   PT Stop Time 0843   PT Time Calculation (min) 43 min   Activity Tolerance Patient tolerated treatment well   Behavior During Therapy [facility] for tasks assessed/performed      Past Medical History  Diagnosis Date  . Thyroid disease     No past surgical history on file.  There were no vitals filed for this visit.  Visit Diagnosis:  Right arm weakness  Decreased ROM of right shoulder  Abnormal posture      Subjective Assessment - 02/07/15 0755    Subjective "I am just wonderful" she reports getting back from the beach but states she was consistent with her HEP.   Currently in Pain? No/denies                         [facility] Adult PT Treatment/Exercise - 02/07/15 0803    Shoulder Exercises: Supine   Other Supine Exercises bench press x 10 followed by bil shoulder flexion with rod 5# 2 x 10 ea.   Other Supine Exercises supine shoulder miliatary press with red/ yellow band 2 x 10, moving in abduciton with controlled eccentric lowering 2 x 10    Shoulder Exercises: Standing   Other Standing Exercises wall washes 2 x 10 abducted (required standing close to wall with elbow bent), 2 x 10 fllexion (was able to extend elbow but fatigued quickly)   Shoulder Exercises: ROM/Strengthening   UBE (Upper Arm Bike) L3 x 5 min   Electrical Stimulation   Electrical Stimulation Location middle deltoid   Electrical Stimulation Action russian   Electrical Stimulation Parameters 10/10 2 sec ramp, 15 min, level 18  using 2"x 4" pads   Electrical Stimulation Goals Strength                PT Education - 02/07/15 [phone]    Education provided Yes   Education Details bring in home TENS if can find it, Progress with HEP, D/C next visit.    Person(s) Educated Patient   Methods Explanation   Comprehension Verbalized understanding          PT Short Term Goals - 12/13/14 0853    PT SHORT TERM GOAL #1   Title pt will be I with inital HEP ( 12/01/2014)   Time 3   Period Weeks   Status Achieved   PT SHORT TERM GOAL #2   Title pt will be able to verbalize and demostrate techniques to reduce R shoulder reinjury via postural awarenss,lifting and carrying mechanics, and HEP (12/01/2014)   Baseline understands shoilder posture   Time 3   Period Weeks   Status Achieved           PT Long Term Goals - 01/31/15 [id number]    PT LONG TERM GOAL #1   Title pt will be i with all HEP given throughout therapy (12/22/2014)   Time 6   Period  Weeks   Status On-going   PT LONG TERM GOAL #2   Title she will demonstrate improve shoulder AROM by >/= 20 degrees with flexion/ abduction to help with job related activities (12/22/2014)   Time 6   Period Weeks   Status On-going   PT LONG TERM GOAL #3   Title she will increase R shoulder strength to >/= 4/5 in all planes to assist with ADLs (12/22/2014)   Time 6   Period Weeks   Status On-going   PT LONG TERM GOAL #4   Title she will be able to lift and carry >/= 5# overhead with </= 2/10 pain to promote lifting associated with job related activities (12/22/2014)   Period Weeks   Status On-going   PT LONG TERM GOAL #5   Title pt will increase her FOTO score to >/= 60 to demonstrate improved function at discharge (12/22/2014)   Time 6   Period Weeks   Status On-going               Plan - 02/07/15 0840    Clinical Impression Statement Rebekah reports she has been consistent with her  exercises and has notices improvment. Focused todays session on supine/ standing exercises to assist with transition of strengthening to promote further progression following physical therapy. she did exhibit increased fatigue with standing wall washes but was able to peform standing closer to the wall. Educated that she has one more visit left and that plan to discharge after next visit to independent exercise and pt agreed.    PT Next Visit Plan , Kx modifier- goals, assessment, foto. D/C progress HEP   Consulted and Agree with Plan of Care Patient        Problem List There are no active problems to display for this patient.  [name] PT, DPT, LAT, ATC  02/07/2015  8:45 AM      [facility] [address] [place], [place], [address] Phone: [phone]   Fax:  [phone]  Name: Brandy Delgado MRN: [id number] Date of Birth: 10/20/1945

## 2015-02-14 ENCOUNTER — Ambulatory Visit: Payer: Medicare Other | Admitting: Physical Therapy

## 2015-02-14 DIAGNOSIS — R293 Abnormal posture: Secondary | ICD-10-CM

## 2015-02-14 DIAGNOSIS — R29898 Other symptoms and signs involving the musculoskeletal system: Secondary | ICD-10-CM | POA: Diagnosis not present

## 2015-02-14 DIAGNOSIS — M25611 Stiffness of right shoulder, not elsewhere classified: Secondary | ICD-10-CM

## 2015-02-14 NOTE — Therapy (Signed)
[place] Del[place], [place], [address] Phone: [phone]   Fax:  [phone]  Physical Therapy Treatment/ discharge note  Patient Details  Name: Brandy Delgado MRN: [id number] Date of Birth: Aug 14, 1945 Referring Provider: Dr Madlyn Frankel  Encounter Date: 02/14/2015      PT End of Session - 02/14/15 1439    Visit Number 22   Number of Visits 23   Date for PT Re-Evaluation 02/16/15   Authorization Type Medicare: Kx mod by 15th visit, progress note by 9th visit   PT Start Time 1405   PT Stop Time 1440   PT Time Calculation (min) 35 min   Activity Tolerance Patient tolerated treatment well   Behavior During Therapy [facility] for tasks assessed/performed      Past Medical History  Diagnosis Date  . Thyroid disease     No past surgical history on file.  There were no vitals filed for this visit.  Visit Diagnosis:  Right arm weakness  Decreased ROM of right shoulder  Abnormal posture      Subjective Assessment - 02/14/15 1404    Subjective "I am doing pretty good, I do get sore from doing alot of house work but overall doing pretty good"   Currently in Pain? No/denies            [facility] PT Assessment - 02/14/15 1410    Observation/Other Assessments   Focus on Therapeutic Outcomes (FOTO)  43% limited   AROM   Right Shoulder Extension 60 Degrees   Right Shoulder Flexion 102 Degrees   Right Shoulder ABduction 55 Degrees   Right Shoulder Internal Rotation 94 Degrees   Right Shoulder External Rotation 75 Degrees   Strength   Right Shoulder Flexion 3-/5   Right Shoulder Extension 4+/5   Right Shoulder ABduction 3-/5   Right Shoulder Internal Rotation 4+/5   Right Shoulder External Rotation 4-/5   Right Hand Grip (lbs) 50.6  48,52,52                     [facility] Adult PT Treatment/Exercise - 02/14/15 1405    Shoulder Exercises: ROM/Strengthening   UBE (Upper Arm Bike) L3 x 5 min    Electrical Stimulation   Electrical Stimulation Location middle deltoid   Electrical Stimulation Action Russian   Electrical Stimulation Parameters 10/10 2 sec ramp, 15 min, level 20  2x 4" pads   Electrical Stimulation Goals Strength                PT Education - 02/14/15 1439    Education provided Yes   Education Details HEP review, and progression of exercises   Person(s) Educated Patient   Methods Explanation   Comprehension Verbalized understanding          PT Short Term Goals - 12/13/14 0853    PT SHORT TERM GOAL #1   Title pt will be I with inital HEP ( 12/01/2014)   Time 3   Period Weeks   Status Achieved   PT SHORT TERM GOAL #2   Title pt will be able to verbalize and demostrate techniques to reduce R shoulder reinjury via postural awarenss,lifting and carrying mechanics, and HEP (12/01/2014)   Baseline understands shoilder posture   Time 3   Period Weeks   Status Achieved           PT Long Term Goals - 02/14/15 1431    PT LONG TERM GOAL #1   Title pt  will be i with all HEP given throughout therapy (12/22/2014)   Time 6   Period Weeks   Status Achieved   PT LONG TERM GOAL #2   Title she will demonstrate improve shoulder AROM by >/= 20 degrees with flexion/ abduction to help with job related activities (12/22/2014)   Baseline abduction 55, flexion 102   Time 6   Period Weeks   Status Partially Met   PT LONG TERM GOAL #3   Title she will increase R shoulder strength to >/= 4/5 in all planes to assist with ADLs (12/22/2014)   Time 6   Period Weeks   Status Not Met   PT LONG TERM GOAL #4   Title she will be able to lift and carry >/= 5# overhead with </= 2/10 pain to promote lifting associated with job related activities (12/22/2014)   Time 6   Period Weeks   Status Not Met   PT LONG TERM GOAL #5   Title pt will increase her FOTO score to >/= 60 to demonstrate improved function at discharge (12/22/2014)   Time 6   Period Weeks   Status Not Met                Plan - March 09, 2015 1439    Clinical Impression Statement Kadience has made progress with therapy and has been consistent with her HEP. She exhibits limitation with AROM secondary to weakness but reports no pain. She met LTG #1,  partially met LTG #2, and did not meet LTG 3,4, or 5. She has improved her strength with flexion/ abduction to a 3-/5 to about 65% of full AROM. She reports that she is able to maintain and progress her exercise to assist her currentl level of function independently and will be discharged from physical therapy today.   PT Next Visit Plan discharge from pt   PT Home Exercise Plan HEP Review   Consulted and Agree with Plan of Care Patient          G-Codes - 03/09/15 1433    Functional Assessment Tool Used FOTO 43% limited   Functional Limitation Carrying, moving and handling objects   Carrying, Moving and Handling Objects Goal Status ([id number]) At least 20 percent but less than 40 percent impaired, limited or restricted   Carrying, Moving and Handling Objects Discharge Status [phone]) At least 40 percent but less than 60 percent impaired, limited or restricted      Problem List There are no active problems to display for this patient.  [name] PT, DPT, LAT, ATC  09-Mar-2015  2:44 PM     [facility] [address] [place], [place], [address] Phone: [phone]   Fax:  [phone]  Name: Brandy Delgado MRN: [id number] Date of Birth: [birth year]    PHYSICAL THERAPY DISCHARGE SUMMARY  Visits from Start of Care: 22  Current functional level related to goals / functional outcomes: FOTO 47% limited   Remaining deficits: Limited AROM of the R shoulder secondary to weakness of the shoulder musculature with no report of pain. Limited endurance with exercise.    Education / Equipment: HEP, theraband for strengthening.   Plan: Patient agrees to discharge.  Patient goals were partially  met. Patient is being discharged due to being pleased with the current functional level.  ?????        Estell Dillinger PT, DPT, LAT, ATC  2015/03/09  2:45 PM

## 2015-11-03 ENCOUNTER — Ambulatory Visit (INDEPENDENT_AMBULATORY_CARE_PROVIDER_SITE_OTHER): Payer: Medicare Other | Admitting: Orthopedic Surgery

## 2015-11-03 DIAGNOSIS — M7542 Impingement syndrome of left shoulder: Secondary | ICD-10-CM | POA: Diagnosis not present

## 2015-12-12 ENCOUNTER — Ambulatory Visit (INDEPENDENT_AMBULATORY_CARE_PROVIDER_SITE_OTHER): Payer: Medicare Other | Admitting: Orthopedic Surgery

## 2015-12-12 ENCOUNTER — Encounter (INDEPENDENT_AMBULATORY_CARE_PROVIDER_SITE_OTHER): Payer: Self-pay | Admitting: Orthopedic Surgery

## 2015-12-12 VITALS — Ht 65.0 in | Wt 170.0 lb

## 2015-12-12 DIAGNOSIS — M7542 Impingement syndrome of left shoulder: Secondary | ICD-10-CM | POA: Diagnosis not present

## 2015-12-12 MED ORDER — METHYLPREDNISOLONE ACETATE 40 MG/ML IJ SUSP
40.0000 mg | INTRAMUSCULAR | Status: AC | PRN
  Administered 2015-12-12: 40 mg via INTRA_ARTICULAR

## 2015-12-12 MED ORDER — LIDOCAINE HCL 1 % IJ SOLN
5.0000 mL | INTRAMUSCULAR | Status: AC | PRN
  Administered 2015-12-12: 5 mL

## 2015-12-12 NOTE — Progress Notes (Signed)
   Office Visit Note   [name]           Date of Birth: [birth year]           MRN: [id number] Visit Date: 12/12/2015              Requested by: No referring provider defined for this encounter. PCP: No PCP Per Patient   Assessment & Plan: Visit Diagnoses:  1. Impingement syndrome of shoulder, left     Plan: Left shoulder injected from the posterior portal. Patient tolerated this well. Plan to follow-up in the office as needed she will continue with her shoulder exercises.  Follow-Up Instructions: Return if symptoms worsen or fail to improve.   Orders:  Orders Placed This Encounter  Procedures  . Large Joint Injection/Arthrocentesis   No orders of the defined types were placed in this encounter.     Procedures: Large Joint Inj Date/Time: 12/12/2015 9:29 AM Performed by: DUDA, MARCUS V Authorized by: Newt Minion   Consent Given by:  Patient Site marked: the procedure site was marked   Timeout: prior to procedure the correct patient, procedure, and site was verified   Indications:  Pain and diagnostic evaluation Location:  Shoulder Site:  L subacromial bursa Prep: patient was prepped and draped in usual sterile fashion   Needle Size:  22 G Needle Length:  1.5 inches Ultrasound Guidance: No   Fluoroscopic Guidance: No   Arthrogram: No   Medications:  5 mL lidocaine 1 %; 40 mg methylPREDNISolone acetate 40 MG/ML Aspiration Attempted: No   Patient tolerance:  Patient tolerated the procedure well with no immediate complications     Clinical Data: No additional findings.   Subjective: Chief Complaint  Patient presents with  . Left Shoulder - Pain, Follow-up    Patient is returning for left shoulder pain. She was here on 11/04/2015 and had injection in her left shoulder. States this did help for some time, but pain returned after a week. Pain mainly with reaching behind back. Throbbing pain.     Review of Systems   Objective: Vital Signs:  Ht 5\' 5"  (1.651 m)   Wt 170 lb (77.1 kg)   BMI 28.29 kg/m   Physical ExamOrtho Exam  patient is alert oriented no adenopathy well-dressed normal affect are square effort she has a normal gait. Patient has full range of motion of both shoulders. No pain with range of motion of right shoulder left shoulder has pain with abduction and internal rotation. She has pain with Neer and Hawkins impingement test on the left.  Specialty Comments:  No specialty comments available.  Imaging: No results found.   PMFS History: Patient Active Problem List   Diagnosis Date Noted  . Impingement syndrome of shoulder, left 12/12/2015   Past Medical History:  Diagnosis Date  . Thyroid disease     No family history on file.  No past surgical history on file. Social History   Occupational History  . Not on file.   Social History Main Topics  . Smoking status: Never Smoker  . Smokeless tobacco: Not on file  . Alcohol use No  . Drug use: No  . Sexual activity: Not on file

## 2016-01-19 ENCOUNTER — Ambulatory Visit (INDEPENDENT_AMBULATORY_CARE_PROVIDER_SITE_OTHER): Payer: Medicare Other

## 2016-01-19 ENCOUNTER — Ambulatory Visit (INDEPENDENT_AMBULATORY_CARE_PROVIDER_SITE_OTHER): Payer: Medicare Other | Admitting: Orthopedic Surgery

## 2016-01-19 VITALS — Ht 65.0 in | Wt 170.0 lb

## 2016-01-19 DIAGNOSIS — M25512 Pain in left shoulder: Secondary | ICD-10-CM

## 2016-01-19 DIAGNOSIS — G8929 Other chronic pain: Secondary | ICD-10-CM

## 2016-01-19 DIAGNOSIS — M7542 Impingement syndrome of left shoulder: Secondary | ICD-10-CM | POA: Diagnosis not present

## 2016-01-19 MED ORDER — LIDOCAINE HCL 1 % IJ SOLN
5.0000 mL | INTRAMUSCULAR | Status: AC | PRN
  Administered 2016-01-19: 5 mL

## 2016-01-19 MED ORDER — METHYLPREDNISOLONE ACETATE 40 MG/ML IJ SUSP
40.0000 mg | INTRAMUSCULAR | Status: AC | PRN
  Administered 2016-01-19: 40 mg via INTRA_ARTICULAR

## 2016-01-19 NOTE — Progress Notes (Signed)
Office Visit Note   [name]           Date of Birth: 1945-07-09           MRN: [id number] Visit Date: 01/19/2016              Requested by: No referring provider defined for this encounter. PCP: No PCP Per Patient  Chief Complaint  Patient presents with  . Left Shoulder - Pain    HPI: Pt states that she has left shoulder pain for the past three months. She has had 2 injections since the pain began and they do help " a little bit but not  A whole lot" she complains of decreased ROM especially away from the body "like when you go through a drive through for a coffee I can not hardly reach the drink"  Autumn L Forrest, RMA   Patient has pain with internal rotation and reaching behind herself. Assessment & Plan: Visit Diagnoses:  1. Impingement syndrome of left shoulder   2. Chronic left shoulder pain     Plan: Patient has some mild adhesive capsulitis with impingement symptoms left shoulder. She has had relief in the past with injections and she wished to proceed with an additional injection today. Patient was also given a prescription for physical therapy at [facility] on [address] for range of motion strengthening and scapular stabilization. Discussed that if she fails conservative treatment with injections and therapy her last option would be arthroscopic debridement.  Follow-Up Instructions: Return if symptoms worsen or fail to improve.   Ortho Exam Examination patient is alert oriented no adenopathy well-dressed normal affect normal respiratory effort she has a normal gait. She has abduction and flexion only to about 100 in the left compared to the right. She has external rotation of 45 internal rotation of 45. The thoracic outlet is nontender to palpation the before meals joint is nontender to palpation she is tender palpation of the biceps tendon. Patient has pain with Neer and Hawkins impingement test.  Imaging: Xr Shoulder Left  Result  Date: 01/19/2016 2 view radiographs of the left shoulder shows a congruent glenohumeral joint no lung field abnormalities patient has no bone spurs at the before meals joint she does have superior migration of the humeral head consistent with impingement of the subacromial space.   Orders:  Orders Placed This Encounter  Procedures  . XR Shoulder Left   No orders of the defined types were placed in this encounter.    Procedures: Large Joint Inj Date/Time: 01/19/2016 4:18 PM Performed by: Marieann Zipp V Authorized by: Newt Minion   Consent Given by:  Patient Site marked: the procedure site was marked   Timeout: prior to procedure the correct patient, procedure, and site was verified   Indications:  Pain and diagnostic evaluation Location:  Shoulder Site:  L subacromial bursa Prep: patient was prepped and draped in usual sterile fashion   Needle Size:  22 G Needle Length:  1.5 inches Approach:  Posterior Ultrasound Guidance: No   Fluoroscopic Guidance: No   Arthrogram: No   Medications:  5 mL lidocaine 1 %; 40 mg methylPREDNISolone acetate 40 MG/ML Aspiration Attempted: No   Patient tolerance:  Patient tolerated the procedure well with no immediate complications    Clinical Data: No additional findings.  Subjective: Review of Systems  Objective: Vital Signs: Ht 5\' 5"  (1.651 m)   Wt 170 lb (77.1 kg)   BMI 28.29 kg/m  Specialty Comments:  No specialty comments available.  PMFS History: Patient Active Problem List   Diagnosis Date Noted  . Impingement syndrome of shoulder, left 12/12/2015   Past Medical History:  Diagnosis Date  . Thyroid disease     No family history on file.  No past surgical history on file. Social History   Occupational History  . Not on file.   Social History Main Topics  . Smoking status: Never Smoker  . Smokeless tobacco: Not on file  . Alcohol use No  . Drug use: No  . Sexual activity: Not on file

## 2016-01-24 ENCOUNTER — Ambulatory Visit: Payer: Medicare Other | Attending: Orthopedic Surgery | Admitting: Physical Therapy

## 2016-01-24 DIAGNOSIS — R29898 Other symptoms and signs involving the musculoskeletal system: Secondary | ICD-10-CM | POA: Insufficient documentation

## 2016-01-24 DIAGNOSIS — M25611 Stiffness of right shoulder, not elsewhere classified: Secondary | ICD-10-CM | POA: Insufficient documentation

## 2016-01-24 DIAGNOSIS — G8929 Other chronic pain: Secondary | ICD-10-CM | POA: Insufficient documentation

## 2016-01-24 DIAGNOSIS — M25512 Pain in left shoulder: Secondary | ICD-10-CM | POA: Diagnosis present

## 2016-01-24 DIAGNOSIS — R293 Abnormal posture: Secondary | ICD-10-CM | POA: Diagnosis present

## 2016-01-24 DIAGNOSIS — M25612 Stiffness of left shoulder, not elsewhere classified: Secondary | ICD-10-CM | POA: Insufficient documentation

## 2016-01-24 DIAGNOSIS — M6281 Muscle weakness (generalized): Secondary | ICD-10-CM

## 2016-01-24 NOTE — Therapy (Signed)
[place], [place], [address] Phone: [phone]   Fax:  [phone]  Physical Therapy Evaluation  Patient Details  Name: Brandy Delgado MRN: [id number] Date of Birth: [birth year] Referring Provider: Meridee Score MD  Encounter Date: 01/24/2016      PT End of Session - 01/24/16 1021    Visit Number 1   Number of Visits 17   Date for PT Re-Evaluation 03/20/16   Authorization Type Medicare: Kx modifier by 15th visit, progress note by 10th visit   PT Start Time 0931   PT Stop Time 1029   PT Time Calculation (min) 58 min   Activity Tolerance Patient tolerated treatment well;Patient limited by pain   Behavior During Therapy [facility] for tasks assessed/performed      Past Medical History:  Diagnosis Date  . Thyroid disease     No past surgical history on file.  There were no vitals filed for this visit.       Subjective Assessment - 01/24/16 0936    Subjective pt is 71 y.o F with CC of L shoulder pain due to a fall onto her shoulder getting into the tub that started 2-3 months ago. doesn't feel like the pain inthe other shoulder previously. since onset the pain has gotten worse. sometimes can lift it sometimes I can due to pain and weakness. denies N/T just pain inthe shoulder. pain mostly in the front of the shoulder into the biceps.  did have 3 shots in the shoulder with minimal beneift with the last being last tuesday.    Limitations Lifting;House hold activities  gripping to hold hair   How long can you sit comfortably? unlimited   How long can you stand comfortably? unlimited   How long can you walk comfortably? unlimited   Diagnostic tests x-ray    Patient Stated Goals to get the shoulder back to normal, reduce pain, be able to use hand/ shoulder for work   Currently in Pain? Yes   Pain Score 5   at worst 10/10   Pain Location Shoulder   Pain Orientation Left   Pain Descriptors / Indicators  Aching;Throbbing   Pain Type Chronic pain   Pain Radiating Towards to the bicep and to the neck   Pain Onset More than a month ago   Pain Frequency Intermittent   Aggravating Factors  working Corporate investment banker), reaching up with the L shoulder   Pain Relieving Factors ibuprofen, heat,    Effect of Pain on Daily Activities limited with work gripping, lifting, carrying            [facility] PT Assessment - 01/24/16 0001      Assessment   Medical Diagnosis L shoulder impingement    Referring Provider Meridee Score MD   Onset Date/Surgical Date --  2-3 months ago   Hand Dominance Right   Next MD Visit make on PRN   Prior Therapy yes  for R shoulder     Precautions   Precautions None     Restrictions   Weight Bearing Restrictions No     Balance Screen   Has the patient fallen in the past 6 months Yes   How many times? 1   Has the patient had a decrease in activity level because of a fear of falling?  No   Is the patient reluctant to leave their home because of a fear of falling?  No     Home Environment   Living  Environment Private residence   Living Arrangements Alone   Type of [place] to enter   Entrance Stairs-Number of Steps 3   Entrance Stairs-Rails Can reach both   Home Layout Multi-level   Alternate Level Stairs-Number of Steps 3     Prior Function   Level of Independence Independent   Vocation Part time employment  hair dresser   Vocation Requirements lifting, carrying, holding, gripping, operating scissors and other equipement   Leisure going to [place],      [employer]   Overall Cognitive Status Within Functional Limits for tasks assessed     Observation/Other Assessments   Focus on Therapeutic Outcomes (FOTO)  50% limited   predicted 34% limited     Posture/Postural Control   Posture/Postural Control Postural limitations   Postural Limitations Rounded Shoulders;Forward head     ROM / Strength   AROM / PROM / Strength  AROM;PROM;Strength     AROM   AROM Assessment Site Shoulder   Right/Left Shoulder Right;Left   Right Shoulder Extension 55 Degrees   Right Shoulder Flexion 153 Degrees   Right Shoulder ABduction 154 Degrees   Right Shoulder Internal Rotation 67 Degrees   Right Shoulder External Rotation 78 Degrees   Left Shoulder Extension 58 Degrees  ERP   Left Shoulder Flexion 101 Degrees  ERP   Left Shoulder ABduction 82 Degrees  ERP   Left Shoulder Internal Rotation 56 Degrees  ERP   Left Shoulder External Rotation 30 Degrees  ERP     PROM   PROM Assessment Site Shoulder   Right/Left Shoulder Left   Left Shoulder Flexion 110 Degrees  end range pain   Left Shoulder ABduction 101 Degrees  end range pain   Left Shoulder Internal Rotation 75 Degrees  end range pain   Left Shoulder External Rotation 24 Degrees  end range pain     Strength   Strength Assessment Site Shoulder;Hand   Right/Left Shoulder Right;Left   Right Shoulder Flexion 3+/5   Right Shoulder Extension 4/5   Right Shoulder ABduction 3+/5   Right Shoulder Internal Rotation 4/5   Right Shoulder External Rotation 4-/5   Left Shoulder Flexion 3+/5  in available ROM, pain during testing    Left Shoulder Extension 4-/5   Left Shoulder ABduction 3/5  in available ROM, pain during testing    Left Shoulder Internal Rotation 3-/5   Left Shoulder External Rotation 3-/5   Right Hand Grip (lbs) 50  47, 50, 53   Left Hand Grip (lbs) 48  49,47, 48     Palpation   Palpation comment significant tightness with trigger points notedin the L upper trap, siginificant sorenss at the supraspinatus, infraspinauts, and teres minor. soreness at the greater / lesser tubercles and along the deltoid.      Special Tests    Special Tests Rotator Cuff Impingement   Rotator Cuff Impingment tests Michel Bickers test;Lift- off test;Empty Can test;Full Can test;other;Painful Arc of Motion     Hawkins-Kennedy test   Findings Positive   Side  Left   Comments increased pain with horizontal adduction position     Lift-Off test   Findings Negative   Side Left     Empty Can test   Findings Positive   Side Left   Comment worse with testing     Full Can test   Findings Positive     Painful Arc of Motion   Findings Positive   Side Left  other   Findings Positive   Side Left   Comments decreased pain with flexion / abduction                             PT Short Term Goals - 01/24/16 1031      PT SHORT TERM GOAL #1   Title pt will be I with inital HEP (02/24/2016)   Time 4   Period Weeks   Status New     PT SHORT TERM GOAL #2   Title pt will be able to verbalize and demostrate techniques to reduce R shoulder reinjury via postural awarenss,lifting and carrying mechanics, and HEP (02/24/2015)   Baseline *   Time 4   Period Weeks   Status New     PT SHORT TERM GOAL #3   Title she will improve L shoulder AROM abduction/ flexion and ER by >/= 10 degrees with </= 5/10 pain to promote mobility (02/24/2015)   Time 4   Period Weeks   Status New     PT SHORT TERM GOAL #4   Title she will increase L grip strength by >/= 4# to demonstrate improvement in shoulder function (02/24/2015)   Time 4   Period Weeks   Status New           PT Long Term Goals - 01/24/16 1033      PT LONG TERM GOAL #1   Title pt will be i with all HEP given throughout therapy (03/20/2016)   Time 8   Period Weeks   Status New     PT LONG TERM GOAL #2   Title she will demonstrate improve L shoulder AROM by to >/= 110  degrees with flexion/ abduction and >/= 50 degrees with ER with </=2/10 pain to assist functional mobility (12/22/2014)   Time 8   Period Weeks   Status New     PT LONG TERM GOAL #3   Title she will increase L shoulder strength to >/= 4/5 in all planes to assist with ADLs and job related tasks (12/22/2014)   Time 8   Period Weeks     PT LONG TERM GOAL #4   Title she will be able to lift and carry >/= 5#  overhead with </= 2/10 pain for L shoulder to promote lifting associated with job related activities (12/22/2014)   Time 8   Period Weeks   Status New     PT LONG TERM GOAL #5   Title pt will increase her FOTO score to >/= 34% limited to demonstrate improved function at discharge (12/22/2014)   Time 6   Period Weeks   Status New               Plan - 01/24/16 1021    Clinical Impression Statement Mrs. Levey presents to OPPT as a low complexity evaluation for CC of L shoulder pain. She demonstrated limited AROM/PROm secondary to pain and tightness. weakness in bil shoulders with L>R with pain during testing. special testing inconlusive regarding  possible impingement pathology vs possible adhesive capuslitus. She would benefit from physical to improve ROM, and strength, reduce pain and return to PLOF by addressing the deficits listed.    Rehab Potential Good   PT Frequency 2x / week   PT Duration 8 weeks   PT Treatment/Interventions ADLs/Self Care Home Management;Cryotherapy;Electrical Stimulation;Iontophoresis 4mg /ml Dexamethasone;Moist Heat;Ultrasound;Therapeutic activities;Therapeutic exercise;Dry needling;Manual techniques;Neuromuscular re-education;Patient/family education;Taping;Passive range of motion   PT Next  Visit Plan assess/ review HEP, mobs for GHJ, scapular stabilization ther ex, modalities.   PT Home Exercise Plan rows, table slides for flexion/ abduction, wand ER, ceiling punches.   Consulted and Agree with Plan of Care Patient      Patient will benefit from skilled therapeutic intervention in order to improve the following deficits and impairments:  Decreased endurance, Decreased activity tolerance, Postural dysfunction, Improper body mechanics, Pain, Increased fascial restricitons, Impaired UE functional use, Decreased range of motion, Decreased strength  Visit Diagnosis: Chronic left shoulder pain - Plan: PT plan of care cert/re-cert  Muscle weakness (generalized)  - Plan: PT plan of care cert/re-cert  Stiffness of left shoulder, not elsewhere classified - Plan: PT plan of care cert/re-cert  Abnormal posture - Plan: PT plan of care cert/re-cert      G-Codes - [id number]    Functional Assessment Tool Used clinical judgement/ FOTO    Functional Limitation Carrying, moving and handling objects   Carrying, Moving and Handling Objects Current Status [id number]) At least 40 percent but less than 60 percent impaired, limited or restricted   Carrying, Moving and Handling Objects Goal Status [id number]) At least 20 percent but less than 40 percent impaired, limited or restricted       Problem List Patient Active Problem List   Diagnosis Date Noted  . Impingement syndrome of shoulder, left 12/12/2015   [name] PT, DPT, LAT, ATC  01/24/16  10:41 AM      [facility] [address] [place], [place], [address] Phone: [phone]   Fax:  [phone]  Name: Brandy Delgado MRN: [id number] Date of Birth: April 02, 1945

## 2016-01-30 ENCOUNTER — Ambulatory Visit: Payer: Medicare Other | Admitting: Physical Therapy

## 2016-01-30 DIAGNOSIS — R293 Abnormal posture: Secondary | ICD-10-CM

## 2016-01-30 DIAGNOSIS — M25611 Stiffness of right shoulder, not elsewhere classified: Secondary | ICD-10-CM

## 2016-01-30 DIAGNOSIS — M25512 Pain in left shoulder: Secondary | ICD-10-CM | POA: Diagnosis not present

## 2016-01-30 DIAGNOSIS — G8929 Other chronic pain: Secondary | ICD-10-CM

## 2016-01-30 DIAGNOSIS — R29898 Other symptoms and signs involving the musculoskeletal system: Secondary | ICD-10-CM

## 2016-01-30 DIAGNOSIS — M6281 Muscle weakness (generalized): Secondary | ICD-10-CM

## 2016-01-30 DIAGNOSIS — M25612 Stiffness of left shoulder, not elsewhere classified: Secondary | ICD-10-CM

## 2016-01-30 NOTE — Therapy (Addendum)
[place], [place], [address] Phone: [phone]   Fax:  [phone]  Physical Therapy Treatment  Patient Details  Name: Brandy Delgado MRN: [id number] Date of Birth: July 12, 1945 Referring Provider: Meridee Score MD  Encounter Date: 01/30/2016      PT End of Session - 01/30/16 1715    Visit Number 2   Number of Visits 17   Date for PT Re-Evaluation 03/20/16   PT Start Time [id number]   PT Stop Time 1507   PT Time Calculation (min) 55 min   Activity Tolerance Patient tolerated treatment well   Behavior During Therapy [facility] for tasks assessed/performed      Past Medical History:  Diagnosis Date  . Thyroid disease     No past surgical history on file.  There were no vitals filed for this visit.      Subjective Assessment - 01/30/16 1411    Subjective "Doing therapy at home my arm feels better. Can go for a while before it starts hurting and then all of the sudden starts hurting."   Currently in Pain? Yes   Pain Score 3    Pain Location Shoulder   Pain Orientation Left   Pain Descriptors / Indicators Aching   Pain Type Chronic pain   Pain Radiating Towards to the bicep   Pain Frequency Intermittent   Aggravating Factors  reaching up not bothering it as much, working though not as bad as before   Pain Relieving Factors ibuprofen, heat            [facility] PT Assessment - 01/30/16 0001      Palpation   Palpation comment tightness and trigger points noted in the L upper trap                     [facility] Adult PT Treatment/Exercise - 01/30/16 1416      Shoulder Exercises: Supine   Protraction Strengthening;Both;10 reps  x 2 sets using UE ranger   External Rotation AAROM;Left;10 reps  x 2 sets with UE ranger   Flexion AAROM;Both;10 reps  x 2 using UE ranger     Shoulder Exercises: Standing   Flexion AAROM;Left;10 reps  x 2 sets using UE ranger   Row Strengthening;15 reps;Theraband  x  2 sets with red theraband     Shoulder Exercises: ROM/Strengthening   UBE (Upper Arm Bike) L1 x 8 minutes  4 minutes forward, 4 minutes back     Modalities   Modalities Moist Heat     Moist Heat Therapy   Number Minutes Moist Heat 10 Minutes   Moist Heat Location Shoulder  L shoulder     Manual Therapy   Manual Therapy Soft tissue mobilization;Joint mobilization   Joint Mobilization Grade 2 AP, PA, and Inferior L GHJ mobs with distraction 3 x 15 seconds each   Soft tissue mobilization STM of L upper trap                PT Education - 01/30/16 1712    Education provided Yes   Education Details Cueing to keep the shoulders from shrugging during exercise.    Person(s) Educated Patient   Methods Explanation;Verbal cues;Tactile cues   Comprehension Verbalized understanding          PT Short Term Goals - 01/24/16 1031      PT SHORT TERM GOAL #1   Title pt will be I with inital HEP (02/24/2016)   Time  4   Period Weeks   Status New     PT SHORT TERM GOAL #2   Title pt will be able to verbalize and demostrate techniques to reduce R shoulder reinjury via postural awarenss,lifting and carrying mechanics, and HEP (02/24/2015)   Baseline *   Time 4   Period Weeks   Status New     PT SHORT TERM GOAL #3   Title she will improve L shoulder AROM abduction/ flexion and ER by >/= 10 degrees with </= 5/10 pain to promote mobility (02/24/2015)   Time 4   Period Weeks   Status New     PT SHORT TERM GOAL #4   Title she will increase L grip strength by >/= 4# to demonstrate improvement in shoulder function (02/24/2015)   Time 4   Period Weeks   Status New           PT Long Term Goals - 01/24/16 1033      PT LONG TERM GOAL #1   Title pt will be i with all HEP given throughout therapy (03/20/2016)   Time 8   Period Weeks   Status New     PT LONG TERM GOAL #2   Title she will demonstrate improve L shoulder AROM by to >/= 110  degrees with flexion/ abduction and >/= 50  degrees with ER with </=2/10 pain to assist functional mobility (12/22/2014)   Time 8   Period Weeks   Status New     PT LONG TERM GOAL #3   Title she will increase L shoulder strength to >/= 4/5 in all planes to assist with ADLs and job related tasks (12/22/2014)   Time 8   Period Weeks     PT LONG TERM GOAL #4   Title she will be able to lift and carry >/= 5# overhead with </= 2/10 pain for L shoulder to promote lifting associated with job related activities (12/22/2014)   Time 8   Period Weeks   Status New     PT LONG TERM GOAL #5   Title pt will increase her FOTO score to >/= 34% limited to demonstrate improved function at discharge (12/22/2014)   Time 6   Period Weeks   Status New               Plan - 01/30/16 1718    Clinical Impression Statement Pt presented with improvement in pain from last visit and increased ROM in all directions. AP, PA, and Inferior L GHJ mobs increased ROM during treatments. Pt was able to perform AAROM in supine with good form and no increase in pain. some L shoulder shrugging when performing standing exercises, needed cueing to stop. Pt has been doing HEP consistently.      PT Next Visit Plan assess/ review HEP, mobs for GHJ, scapular stabilization ther ex, modalities, strengthening of IR/ER, arm bike, serratus anterior strengthening   PT Home Exercise Plan rows, table slides for flexion/ abduction, wand ER, ceiling punches.   Consulted and Agree with Plan of Care Patient      Patient will benefit from skilled therapeutic intervention in order to improve the following deficits and impairments:  Decreased endurance, Decreased activity tolerance, Postural dysfunction, Improper body mechanics, Pain, Increased fascial restricitons, Impaired UE functional use, Decreased range of motion, Decreased strength  Visit Diagnosis: Chronic left shoulder pain  Muscle weakness (generalized)  Stiffness of left shoulder, not elsewhere classified  Abnormal  posture  Right arm weakness  Decreased  ROM of right shoulder     Problem List Patient Active Problem List   Diagnosis Date Noted  . Impingement syndrome of shoulder, left 12/12/2015    Alda Ponder, SPT 01/30/2016, 5:44 PM  [place], [place], [address] Phone: [phone]   Fax:  [phone]  Name: LEVAEH GOYETTE MRN: [id number] Date of Birth: [birth year]   [name] PT, DPT, LAT, ATC  01/30/16  5:49 PM

## 2016-02-06 ENCOUNTER — Encounter: Payer: Self-pay | Admitting: Physical Therapy

## 2016-02-06 ENCOUNTER — Ambulatory Visit: Payer: Medicare Other | Admitting: Physical Therapy

## 2016-02-06 DIAGNOSIS — M25611 Stiffness of right shoulder, not elsewhere classified: Secondary | ICD-10-CM

## 2016-02-06 DIAGNOSIS — R29898 Other symptoms and signs involving the musculoskeletal system: Secondary | ICD-10-CM

## 2016-02-06 DIAGNOSIS — M25512 Pain in left shoulder: Principal | ICD-10-CM

## 2016-02-06 DIAGNOSIS — R293 Abnormal posture: Secondary | ICD-10-CM

## 2016-02-06 DIAGNOSIS — M25612 Stiffness of left shoulder, not elsewhere classified: Secondary | ICD-10-CM

## 2016-02-06 DIAGNOSIS — M6281 Muscle weakness (generalized): Secondary | ICD-10-CM

## 2016-02-06 DIAGNOSIS — G8929 Other chronic pain: Secondary | ICD-10-CM

## 2016-02-06 NOTE — Patient Instructions (Addendum)
Over Head Pull: Narrow Grip       On back, knees bent, feet flat, band across thighs, elbows straight but relaxed. Pull hands apart (start). Keeping elbows straight, bring arms up and over head, hands toward floor. Keep pull steady on band. Hold momentarily. Return slowly, keeping pull steady, back to start. Repeat 10___ times. Band color ___yellow___   Side Pull: Double Arm   On back, knees bent, feet flat. Arms perpendicular to body, shoulder level, elbows straight but relaxed. Pull arms out to sides, elbows straight. Resistance band comes across collarbones, hands toward floor. Hold momentarily. Slowly return to starting position. Repeat __10_ times. Band color __yellow___   Sash   On back, knees bent, feet flat, left hand on left hip, right hand above left. Pull right arm DIAGONALLY (hip to shoulder) across chest. Bring right arm along head toward floor. Hold momentarily. Slowly return to starting position. Repeat ___ times. Do with left arm. Band color ______       TRY Later      Shoulder Rotation: Double Arm   On back, knees bent, feet flat, elbows tucked at sides, bent 90, hands palms up. Pull hands apart and down toward floor, keeping elbows near sides. Hold momentarily. Slowly return to starting position. Repeat _10_ times. Band color ____No Band ,  Try yellow band in a week or two.__

## 2016-02-06 NOTE — Therapy (Signed)
[place], [place], [address] Phone: [phone]   Fax:  [phone]  Physical Therapy Treatment  Patient Details  Name: Brandy Delgado MRN: [id number] Date of Birth: 06/02/1945 Referring Provider: Meridee Score MD  Encounter Date: 02/06/2016      PT End of Session - 02/06/16 1811    Visit Number 3   Number of Visits 17   Date for PT Re-Evaluation 03/20/16   PT Start Time 1332   PT Stop Time 1415   PT Time Calculation (min) 43 min   Activity Tolerance Patient tolerated treatment well   Behavior During Therapy [facility] for tasks assessed/performed      Past Medical History:  Diagnosis Date  . Thyroid disease     History reviewed. No pertinent surgical history.  There were no vitals filed for this visit.      Subjective Assessment - 02/06/16 1336    Subjective I am able to reach up more.  I can't sleep on my shoulder long.The exercises have really helped.   Currently in Pain? Yes   Pain Score 0-No pain  Mild pain with exercises,  no pain now   Pain Location Shoulder   Pain Orientation Left   Pain Descriptors / Indicators Aching;Sore   Pain Type Chronic pain   Pain Radiating Towards to bicep   Pain Frequency Intermittent   Aggravating Factors  sleeping on shoul;der,  holding arm up,  reaching   Pain Relieving Factors ibuprophen, rest, heat            [facility] PT Assessment - 02/06/16 0001      AROM   Left Shoulder Flexion 116 Degrees   Left Shoulder ABduction --  133 with scaption                     [facility] Adult PT Treatment/Exercise - 02/06/16 0001      Shoulder Exercises: Supine   Other Supine Exercises supine scapular stabilization, yellow band : Flexion, horizontal abd 10 x,  ER AROM 10 X( Unable to do with band.  Sash tried but stopped due to pain      Shoulder Exercises: Standing   Row Strengthening   Theraband Level (Shoulder Row) Level 1 (Yellow)   Row Limitations  10 x 2ets     Shoulder Exercises: ROM/Strengthening   Other ROM/Strengthening Exercises UE ranger flexion,( table slides) 10 X 3 positions flexion and scaption  ER !)  X UE ranger     Shoulder Exercises: Stretch   Corner Stretch Limitations 3 reps single arm 20 seconds, cues initially                PT Education - 02/06/16 1810    Education provided Yes   Education Details HEP   Person(s) Educated Patient   Methods Explanation;Demonstration;Tactile cues;Verbal cues  Handout would not print   Comprehension Verbalized understanding;Returned demonstration          PT Short Term Goals - 02/06/16 1813      PT SHORT TERM GOAL #1   Title pt will be I with inital HEP (02/24/2016)   Baseline so far is independent   Time 4   Period Weeks   Status On-going     PT SHORT TERM GOAL #2   Title pt will be able to verbalize and demostrate techniques to reduce R shoulder reinjury via postural awarenss,lifting and carrying mechanics, and HEP (02/24/2015)   Time 4   Period  Weeks   Status Unable to assess     PT SHORT TERM GOAL #3   Title she will improve L shoulder AROM abduction/ flexion and ER by >/= 10 degrees with </= 5/10 pain to promote mobility (02/24/2015)   Baseline improving   Time 4   Period Weeks   Status On-going     PT SHORT TERM GOAL #4   Title she will increase L grip strength by >/= 4# to demonstrate improvement in shoulder function (02/24/2015)   Time 4   Period Weeks   Status Unable to assess           PT Long Term Goals - 01/24/16 1033      PT LONG TERM GOAL #1   Title pt will be i with all HEP given throughout therapy (03/20/2016)   Time 8   Period Weeks   Status New     PT LONG TERM GOAL #2   Title she will demonstrate improve L shoulder AROM by to >/= 110  degrees with flexion/ abduction and >/= 50 degrees with ER with </=2/10 pain to assist functional mobility (12/22/2014)   Time 8   Period Weeks   Status New     PT LONG TERM GOAL #3   Title she  will increase L shoulder strength to >/= 4/5 in all planes to assist with ADLs and job related tasks (12/22/2014)   Time 8   Period Weeks     PT LONG TERM GOAL #4   Title she will be able to lift and carry >/= 5# overhead with </= 2/10 pain for L shoulder to promote lifting associated with job related activities (12/22/2014)   Time 8   Period Weeks   Status New     PT LONG TERM GOAL #5   Title pt will increase her FOTO score to >/= 34% limited to demonstrate improved function at discharge (12/22/2014)   Time 6   Period Weeks   Status New               Plan - 02/06/16 1811    Clinical Impression Statement Patient has been motivated to do her HEP and her ROM is improved, see exercise flow sheet.  Progress toward her ROM and HEP goals.  No pain increased at end of session with patient declining the need for modalities.    PT Next Visit Plan assess/ review HEP, mobs for GHJ, scapular stabilization ther ex, modalities, strengthening of IR/ER, arm bike, serratus anterior strengthening,  reprint HEP from last visit.   PT Home Exercise Plan rows, table slides for flexion/ abduction, wand ER, ceiling punches.   Consulted and Agree with Plan of Care Patient      Patient will benefit from skilled therapeutic intervention in order to improve the following deficits and impairments:  Decreased endurance, Decreased activity tolerance, Postural dysfunction, Improper body mechanics, Pain, Increased fascial restricitons, Impaired UE functional use, Decreased range of motion, Decreased strength  Visit Diagnosis: Chronic left shoulder pain  Muscle weakness (generalized)  Stiffness of left shoulder, not elsewhere classified  Abnormal posture  Right arm weakness  Decreased ROM of right shoulder     Problem List Patient Active Problem List   Diagnosis Date Noted  . Impingement syndrome of shoulder, left 12/12/2015    Jeryl Wilbourn PTA 02/06/2016, 6:14 PM  [facility] [address] [place], [place], [address] Phone: [phone]   Fax:  [phone]  Name: Brandy Delgado MRN: [id number]  Date of Birth: [birth year]

## 2016-02-13 ENCOUNTER — Ambulatory Visit: Payer: Medicare Other | Admitting: Physical Therapy

## 2016-02-13 DIAGNOSIS — M25512 Pain in left shoulder: Principal | ICD-10-CM

## 2016-02-13 DIAGNOSIS — G8929 Other chronic pain: Secondary | ICD-10-CM

## 2016-02-13 DIAGNOSIS — R293 Abnormal posture: Secondary | ICD-10-CM

## 2016-02-13 DIAGNOSIS — M25612 Stiffness of left shoulder, not elsewhere classified: Secondary | ICD-10-CM

## 2016-02-13 DIAGNOSIS — M6281 Muscle weakness (generalized): Secondary | ICD-10-CM

## 2016-02-13 NOTE — Therapy (Signed)
[place], [place], [address] Phone: [phone]   Fax:  [phone]  Physical Therapy Treatment  Patient Details  Name: Brandy Delgado MRN: [id number] Date of Birth: [birth year] Referring Provider: Meridee Score MD  Encounter Date: 02/13/2016      PT End of Session - 02/13/16 1653    Visit Number 4   Number of Visits 17   Date for PT Re-Evaluation 03/20/16   PT Start Time [id number]   PT Stop Time 1510   PT Time Calculation (min) 53 min   Activity Tolerance Patient tolerated treatment well   Behavior During Therapy [facility] for tasks assessed/performed      Past Medical History:  Diagnosis Date  . Thyroid disease     No past surgical history on file.  There were no vitals filed for this visit.      Subjective Assessment - 02/13/16 1417    Subjective "I'm okay. Last week was a hard week at work, I a lot of perms and colors come in. After doing my exercises my arm was really killing me. Not too bad today. I took some advil about 6 this morning."   Currently in Pain? Yes   Pain Score 2   up to a 7 this morning, I may have slept on it   Pain Location Shoulder   Pain Orientation Left   Pain Descriptors / Indicators Aching;Sore   Pain Type Chronic pain   Pain Radiating Towards N/A   Pain Frequency Intermittent   Aggravating Factors  sleeping on shoulder, reaching   Pain Relieving Factors advil, rest, heat            [facility] PT Assessment - 02/13/16 0001      PROM   Left Shoulder Flexion 143 Degrees                     [facility] Adult PT Treatment/Exercise - 02/13/16 1423      Shoulder Exercises: Supine   Horizontal ABduction 10 reps  x 2 sets   Theraband Level (Shoulder Horizontal ABduction) Level 1 (Yellow)   Flexion Both;10 reps  x 2 sets   Shoulder Flexion Weight (lbs) 2   Flexion Limitations with dowel   Other Supine Exercises UE D2 1 x 20 passive, 1 x 10 active   Other Supine  Exercises ceiling punches 2 x 10 with 2# on dowel     Shoulder Exercises: Standing   Row 10 reps;Theraband  x 2 sets   Theraband Level (Shoulder Row) Level 2 (Red)     Shoulder Exercises: ROM/Strengthening   UBE (Upper Arm Bike) L1 x 6 minutes  changing direction at 3 minutes     Moist Heat Therapy   Number Minutes Moist Heat 10 Minutes   Moist Heat Location Shoulder  L shoulder     Manual Therapy   Joint Mobilization Grade 3-4 AP, PA, and Inferior L GHJ mobs with distraction; grade 3-4 distraction with passive flexion                   PT Short Term Goals - 02/06/16 1813      PT SHORT TERM GOAL #1   Title pt will be I with inital HEP (02/24/2016)   Baseline so far is independent   Time 4   Period Weeks   Status On-going     PT SHORT TERM GOAL #2   Title pt will be able to verbalize and  demostrate techniques to reduce R shoulder reinjury via postural awarenss,lifting and carrying mechanics, and HEP (02/24/2015)   Time 4   Period Weeks   Status Unable to assess     PT SHORT TERM GOAL #3   Title she will improve L shoulder AROM abduction/ flexion and ER by >/= 10 degrees with </= 5/10 pain to promote mobility (02/24/2015)   Baseline improving   Time 4   Period Weeks   Status On-going     PT SHORT TERM GOAL #4   Title she will increase L grip strength by >/= 4# to demonstrate improvement in shoulder function (02/24/2015)   Time 4   Period Weeks   Status Unable to assess           PT Long Term Goals - 01/24/16 1033      PT LONG TERM GOAL #1   Title pt will be i with all HEP given throughout therapy (03/20/2016)   Time 8   Period Weeks   Status New     PT LONG TERM GOAL #2   Title she will demonstrate improve L shoulder AROM by to >/= 110  degrees with flexion/ abduction and >/= 50 degrees with ER with </=2/10 pain to assist functional mobility (12/22/2014)   Time 8   Period Weeks   Status New     PT LONG TERM GOAL #3   Title she will increase L shoulder  strength to >/= 4/5 in all planes to assist with ADLs and job related tasks (12/22/2014)   Time 8   Period Weeks     PT LONG TERM GOAL #4   Title she will be able to lift and carry >/= 5# overhead with </= 2/10 pain for L shoulder to promote lifting associated with job related activities (12/22/2014)   Time 8   Period Weeks   Status New     PT LONG TERM GOAL #5   Title pt will increase her FOTO score to >/= 34% limited to demonstrate improved function at discharge (12/22/2014)   Time 6   Period Weeks   Status New               Plan - 02/13/16 1658    Clinical Impression Statement Ms. Lecompte reports that her pain has been up the last few days due to increased workload last week and possibly rolling onto her shoulder during the night. She had increased AAROM during active shoulder flexion after L GHJ mobs. She reported some difficulty while performing UE D2 patterns actively. Continued MHP post session.    PT Next Visit Plan assess/ review HEP, mobs for GHJ, scapular stabilization ther ex, modalities, strengthening of IR/ER, arm bike, serratus anterior strengthening,  reprint HEP from last visit, thoracic foam roll series   PT Home Exercise Plan rows, table slides for flexion/ abduction, wand ER, ceiling punches.   Consulted and Agree with Plan of Care Patient      Patient will benefit from skilled therapeutic intervention in order to improve the following deficits and impairments:  Decreased endurance, Decreased activity tolerance, Postural dysfunction, Improper body mechanics, Pain, Increased fascial restricitons, Impaired UE functional use, Decreased range of motion, Decreased strength  Visit Diagnosis: Chronic left shoulder pain  Muscle weakness (generalized)  Stiffness of left shoulder, not elsewhere classified  Abnormal posture     Problem List Patient Active Problem List   Diagnosis Date Noted  . Impingement syndrome of shoulder, left 12/12/2015    Alda Ponder,  SPT 02/13/2016, 5:14 PM  [place], [place], [address] Phone: [phone]   Fax:  [phone]  Name: ELIS MOLETTE MRN: [id number] Date of Birth: 04-08-1945

## 2016-02-15 ENCOUNTER — Ambulatory Visit: Payer: Medicare Other | Admitting: Physical Therapy

## 2016-02-15 DIAGNOSIS — G8929 Other chronic pain: Secondary | ICD-10-CM

## 2016-02-15 DIAGNOSIS — M25512 Pain in left shoulder: Principal | ICD-10-CM

## 2016-02-15 DIAGNOSIS — R29898 Other symptoms and signs involving the musculoskeletal system: Secondary | ICD-10-CM

## 2016-02-15 DIAGNOSIS — M25611 Stiffness of right shoulder, not elsewhere classified: Secondary | ICD-10-CM

## 2016-02-15 DIAGNOSIS — M6281 Muscle weakness (generalized): Secondary | ICD-10-CM

## 2016-02-15 NOTE — Patient Instructions (Addendum)
Over Head Pull: Narrow Grip       On back, knees bent, feet flat, band across thighs, elbows straight but relaxed. Pull hands apart (start). Keeping elbows straight, bring arms up and over head, hands toward floor. Keep pull steady on band. Hold momentarily. Return slowly, keeping pull steady, back to start. Repeat __10_ times. Band color ____red__   Side Pull: Double Arm   On back, knees bent, feet flat. Arms perpendicular to body, shoulder level, elbows straight but relaxed. Pull arms out to sides, elbows straight. Resistance band comes across collarbones, hands toward floor. Hold momentarily. Slowly return to starting position. Repeat _10__ times. Band color _yellow_      Shoulder Rotation: Double Arm   On back, knees bent, feet flat, elbows tucked at sides, bent 90, hands palms up. Pull hands apart and down toward floor, keeping elbows near sides. Hold momentarily. Slowly return to starting position. Repeat _10__ times. Band color ______ No band for now,  Try yellow band in a few days.

## 2016-02-15 NOTE — Therapy (Signed)
[place], [place], [address] Phone: [phone]   Fax:  [phone]  Physical Therapy Treatment  Patient Details  Name: Brandy Delgado MRN: [id number] Date of Birth: July 21, 1945 Referring Provider: Meridee Score MD  Encounter Date: 02/15/2016      PT End of Session - 02/15/16 1546    Visit Number 5   Number of Visits 17   Date for PT Re-Evaluation 03/20/16   PT Start Time 1505   PT Stop Time 1559   PT Time Calculation (min) 54 min   Activity Tolerance Patient tolerated treatment well   Behavior During Therapy [facility] for tasks assessed/performed      Past Medical History:  Diagnosis Date  . Thyroid disease     No past surgical history on file.  There were no vitals filed for this visit.      Subjective Assessment - 02/15/16 1507    Subjective No pain right now.              [facility] PT Assessment - 02/15/16 0001      PROM   Left Shoulder Flexion 145 Degrees   Left Shoulder ABduction 110 Degrees                     [facility] Adult PT Treatment/Exercise - 02/15/16 0001      Shoulder Exercises: Supine   Protraction 10 reps  2 sets 0, 1 LBS each cues initially   Horizontal ABduction 10 reps   Theraband Level (Shoulder Horizontal ABduction) Level 1 (Yellow)     Shoulder Exercises: Standing   Theraband Level (Shoulder Extension) Level 1 (Yellow)   Row 10 reps;Theraband  x 2 sets   Theraband Level (Shoulder Row) Level 1 (Yellow)     Shoulder Exercises: ROM/Strengthening   UBE (Upper Arm Bike) L1.1 x 6 minutes  changing direction at 3 minutes     Shoulder Exercises: Isometric Strengthening   External Rotation Limitations 5 x 5 seconds, 2 sets   Internal Rotation --  10 X 5 seconds     Shoulder Exercises: Stretch   Other Shoulder Stretches pulleys flexion 3 minutes     Moist Heat Therapy   Number Minutes Moist Heat 15 Minutes   Moist Heat Location Shoulder  supine     Manual Therapy   Joint Mobilization Grade 2-3 posterior, inferior capsule mobs.     Soft tissue mobilization Gentle RON abduction, flexion 5-10 X each                PT Education - 02/15/16 1546    Education provided Yes   Education Details HEP   Person(s) Educated Patient   Methods Explanation;Demonstration;Tactile cues;Verbal cues;Handout   Comprehension Verbalized understanding;Returned demonstration          PT Short Term Goals - 02/15/16 1550      PT SHORT TERM GOAL #1   Title pt will be I with inital HEP (02/24/2016)   Baseline so far is independent   Time 4   Status On-going     PT SHORT TERM GOAL #2   Title pt will be able to verbalize and demostrate techniques to reduce R shoulder reinjury via postural awarenss,lifting and carrying mechanics, and HEP (02/24/2015)   Baseline tries to use good posture at work   Time 4   Period Weeks   Status On-going     PT SHORT TERM GOAL #3   Title she will improve L shoulder  AROM abduction/ flexion and ER by >/= 10 degrees with </= 5/10 pain to promote mobility (02/24/2015)   Baseline improving   Time 4   Period Weeks   Status On-going     PT SHORT TERM GOAL #4   Title she will increase L grip strength by >/= 4# to demonstrate improvement in shoulder function (02/24/2015)   Time 4   Period Weeks   Status Unable to assess           PT Long Term Goals - 01/24/16 1033      PT LONG TERM GOAL #1   Title pt will be i with all HEP given throughout therapy (03/20/2016)   Time 8   Period Weeks   Status New     PT LONG TERM GOAL #2   Title she will demonstrate improve L shoulder AROM by to >/= 110  degrees with flexion/ abduction and >/= 50 degrees with ER with </=2/10 pain to assist functional mobility (12/22/2014)   Time 8   Period Weeks   Status New     PT LONG TERM GOAL #3   Title she will increase L shoulder strength to >/= 4/5 in all planes to assist with ADLs and job related tasks (12/22/2014)   Time 8   Period  Weeks     PT LONG TERM GOAL #4   Title she will be able to lift and carry >/= 5# overhead with </= 2/10 pain for L shoulder to promote lifting associated with job related activities (12/22/2014)   Time 8   Period Weeks   Status New     PT LONG TERM GOAL #5   Title pt will increase her FOTO score to >/= 34% limited to demonstrate improved function at discharge (12/22/2014)   Time 6   Period Weeks   Status New               Plan - 02/15/16 1547    Clinical Impression Statement 110 AAROM abduction.  Progressed her home exercises today.  Patient continues to be motivated to exercise.  No new goals met.     PT Next Visit Plan assess/ review HEP, mobs for GHJ, scapular stabilization ther ex, modalities, strengthening of IR/ER, arm bike, serratus anterior strengthening, , thoracic foam roll series   PT Home Exercise Plan rows, table slides for flexion/ abduction, wand ER, ceiling punches.  narrow grip, horizontal abd,  yellow band,  ER bilateral No band,   Consulted and Agree with Plan of Care Patient      Patient will benefit from skilled therapeutic intervention in order to improve the following deficits and impairments:  Decreased endurance, Decreased activity tolerance, Postural dysfunction, Improper body mechanics, Pain, Increased fascial restricitons, Impaired UE functional use, Decreased range of motion, Decreased strength  Visit Diagnosis: Chronic left shoulder pain  Muscle weakness (generalized)  Right arm weakness  Decreased ROM of right shoulder     Problem List Patient Active Problem List   Diagnosis Date Noted  . Impingement syndrome of shoulder, left 12/12/2015    HARRIS,KAREN PTA 02/15/2016, 3:59 PM  [facility] [address] [place], [place], [address] Phone: [phone]   Fax:  [phone]  Name: Brandy Delgado MRN: [id number] Date of Birth: [birth year]

## 2016-02-20 ENCOUNTER — Ambulatory Visit: Payer: Medicare Other | Attending: Orthopedic Surgery | Admitting: Physical Therapy

## 2016-02-20 DIAGNOSIS — R293 Abnormal posture: Secondary | ICD-10-CM | POA: Insufficient documentation

## 2016-02-20 DIAGNOSIS — M6281 Muscle weakness (generalized): Secondary | ICD-10-CM | POA: Insufficient documentation

## 2016-02-20 DIAGNOSIS — G8929 Other chronic pain: Secondary | ICD-10-CM | POA: Insufficient documentation

## 2016-02-20 DIAGNOSIS — M25612 Stiffness of left shoulder, not elsewhere classified: Secondary | ICD-10-CM | POA: Insufficient documentation

## 2016-02-20 DIAGNOSIS — M25611 Stiffness of right shoulder, not elsewhere classified: Secondary | ICD-10-CM | POA: Insufficient documentation

## 2016-02-20 DIAGNOSIS — M25512 Pain in left shoulder: Secondary | ICD-10-CM | POA: Diagnosis not present

## 2016-02-20 DIAGNOSIS — R29898 Other symptoms and signs involving the musculoskeletal system: Secondary | ICD-10-CM | POA: Diagnosis present

## 2016-02-20 NOTE — Therapy (Signed)
[place], [place], [address] Phone: [phone]   Fax:  [phone]  Physical Therapy Treatment  Patient Details  Name: MAMIE HUNDERTMARK MRN: [id number] Date of Birth: 1945/02/20 Referring Provider: Meridee Score MD  Encounter Date: 02/20/2016      PT End of Session - 02/20/16 1504    Visit Number 6   Number of Visits 17   Date for PT Re-Evaluation 03/20/16   PT Start Time 3007   PT Stop Time 1510   PT Time Calculation (min) 53 min   Behavior During Therapy [facility] for tasks assessed/performed      Past Medical History:  Diagnosis Date  . Thyroid disease     No past surgical history on file.  There were no vitals filed for this visit.      Subjective Assessment - 02/20/16 1420    Subjective I was soer and achey all day yesterday.  It pops and felt heavy.  i was fine Sat and I am fine today.    Currently in Pain? Yes   Pain Score 2   was moderate yesterday,  between 1-2 today   Pain Orientation Left   Pain Descriptors / Indicators Aching;Sore   Pain Type Chronic pain   Aggravating Factors  weather change ?   Pain Relieving Factors change of weather,  advil,  rest,  heat ( brief)   Effect of Pain on Daily Activities limits activities            [facility] PT Assessment - 02/20/16 0001      Strength   Right Shoulder Internal Rotation 4/5   Left Shoulder Internal Rotation 4-/5                     [facility] Adult PT Treatment/Exercise - 02/20/16 0001      Shoulder Exercises: Supine   Protraction 10 reps  rolled 2 towels   Horizontal ABduction 10 reps   Horizontal ABduction Limitations 2 rolled towels  10 X,  also snow angels, arms only 10 X,  some ROM compensat   Flexion 10 reps   Flexion Limitations over rolled towels, alternating 10 X   Other Supine Exercises triceps 10 x 2 sets of 10     Shoulder Exercises: Standing   Extension 10 reps   Theraband Level (Shoulder Extension)  Level 1 (Yellow)   Row 10 reps  2 sets   Theraband Level (Shoulder Row) Level 1 (Yellow);Level 2 (Red)   Other Standing Exercises reach overhead with blue roll, and roll 10 X overhead, and press 10 X overehead.      Shoulder Exercises: ROM/Strengthening   UBE (Upper Arm Bike) L 1.2 3 minutes each way,       Shoulder Exercises: Isometric Strengthening   External Rotation Limitations 10 x 5 seconds, 2 sets   Internal Rotation --  10 x 5 seconds     Moist Heat Therapy   Number Minutes Moist Heat 10 Minutes   Moist Heat Location Shoulder     Manual Therapy   Manual therapy comments soft tissue work to decrease stiffness/ pain post exercise overhead.                  PT Short Term Goals - 02/20/16 1509      PT SHORT TERM GOAL #1   Title pt will be I with inital HEP (02/24/2016)   Baseline independent   Time 4   Period Weeks  Status Achieved     PT SHORT TERM GOAL #2   Title pt will be able to verbalize and demostrate techniques to reduce R shoulder reinjury via postural awarenss,lifting and carrying mechanics, and HEP (02/24/2015)   Time 4   Period Weeks   Status Unable to assess     PT SHORT TERM GOAL #3   Title she will improve L shoulder AROM abduction/ flexion and ER by >/= 10 degrees with </= 5/10 pain to promote mobility (02/24/2015)   Time 4   Period Weeks   Status Unable to assess     PT SHORT TERM GOAL #4   Title she will increase L grip strength by >/= 4# to demonstrate improvement in shoulder function (02/24/2015)   Time 4   Period Weeks   Status Unable to assess           PT Long Term Goals - 01/24/16 1033      PT LONG TERM GOAL #1   Title pt will be i with all HEP given throughout therapy (03/20/2016)   Time 8   Period Weeks   Status New     PT LONG TERM GOAL #2   Title she will demonstrate improve L shoulder AROM by to >/= 110  degrees with flexion/ abduction and >/= 50 degrees with ER with </=2/10 pain to assist functional mobility  (12/22/2014)   Time 8   Period Weeks   Status New     PT LONG TERM GOAL #3   Title she will increase L shoulder strength to >/= 4/5 in all planes to assist with ADLs and job related tasks (12/22/2014)   Time 8   Period Weeks     PT LONG TERM GOAL #4   Title she will be able to lift and carry >/= 5# overhead with </= 2/10 pain for L shoulder to promote lifting associated with job related activities (12/22/2014)   Time 8   Period Weeks   Status New     PT LONG TERM GOAL #5   Title pt will increase her FOTO score to >/= 34% limited to demonstrate improved function at discharge (12/22/2014)   Time 6   Period Weeks   Status New               Plan - 02/20/16 1505    Clinical Impression Statement MMT Left shoulder IR 4/5,  ER 4-/5.  Endurance and strenght are improving.   STG #1 met for independent with initial HEP>  No increased pain post session,  Muscles tightened  and mid deltiod pain noted briefly after overhead reaching.  Progress toward strength goal .   PT Next Visit Plan , mobs for GHJ, scapular stabilization ther ex, modalities, strengthening of IR/ER, arm bike, serratus anterior strengthening, , thoracic foam roll series,  patient wants to be able to use arn to curl the back of her head. Check grip and specific goals.   PT Home Exercise Plan rows, table slides for flexion/ abduction, wand ER, ceiling punches.  narrow grip, horizontal abd,  yellow band,  ER bilateral No band,   Consulted and Agree with Plan of Care Patient      Patient will benefit from skilled therapeutic intervention in order to improve the following deficits and impairments:     Visit Diagnosis: Chronic left shoulder pain  Right arm weakness     Problem List Patient Active Problem List   Diagnosis Date Noted  . Impingement syndrome of shoulder, left  12/12/2015    Lizzeth Meder PTA 02/20/2016, 3:11 PM  [facility] [address] [place], [place], [address] Phone: [phone]   Fax:  [phone]  Name: SHELTON SOLER MRN: [id number] Date of Birth: 1945/03/29

## 2016-02-22 ENCOUNTER — Ambulatory Visit: Payer: Medicare Other | Admitting: Physical Therapy

## 2016-02-22 DIAGNOSIS — M6281 Muscle weakness (generalized): Secondary | ICD-10-CM

## 2016-02-22 DIAGNOSIS — M25612 Stiffness of left shoulder, not elsewhere classified: Secondary | ICD-10-CM

## 2016-02-22 DIAGNOSIS — R293 Abnormal posture: Secondary | ICD-10-CM

## 2016-02-22 DIAGNOSIS — M25512 Pain in left shoulder: Principal | ICD-10-CM

## 2016-02-22 DIAGNOSIS — G8929 Other chronic pain: Secondary | ICD-10-CM

## 2016-02-22 NOTE — Therapy (Signed)
[place], [place], [address] Phone: [phone]   Fax:  [phone]  Physical Therapy Treatment  Patient Details  Name: Brandy Delgado MRN: [id number] Date of Birth: 11/07/1945 Referring Provider: Meridee Score MD  Encounter Date: 02/22/2016      PT End of Session - 02/22/16 1547    Visit Number 7   Number of Visits 17   Date for PT Re-Evaluation 03/20/16   Authorization Type Medicare: Kx modifier by 15th visit, progress note by 10th visit   PT Start Time 1500   PT Stop Time 1555   PT Time Calculation (min) 55 min   Activity Tolerance Patient tolerated treatment well   Behavior During Therapy [facility] for tasks assessed/performed      Past Medical History:  Diagnosis Date  . Thyroid disease     No past surgical history on file.  There were no vitals filed for this visit.      Subjective Assessment - 02/22/16 1504    Subjective " I am feeling sore today, I think it is due to the rain. I was sore the other day and decided to do my HEP and it helped my soreness"    Currently in Pain? Yes   Pain Score 3    Pain Location Shoulder   Pain Orientation Left                         [facility] Adult PT Treatment/Exercise - 02/22/16 1507      Shoulder Exercises: Standing   External Rotation Strengthening;Left;10 reps   Theraband Level (Shoulder External Rotation) Level 2 (Red)   Internal Rotation Strengthening;10 reps;Theraband   Theraband Level (Shoulder Internal Rotation) Level 2 (Red)   Internal Rotation Limitations unable to perform so halted exercise and modified to sidelying with no weight   Extension 10 reps   Theraband Level (Shoulder Extension) Level 2 (Red)   Row Strengthening;Both;10 reps   Theraband Level (Shoulder Row) Level 2 (Red)   Other Standing Exercises 1# reaching into cabinets 1 x 10     Shoulder Exercises: ROM/Strengthening   UBE (Upper Arm Bike) L1.5 x 8 min   changing direction at 4 min   Other ROM/Strengthening Exercises wall lader 5 x flexoin/ abuction with controlled eccentric lowering     Shoulder Exercises: Stretch   Other Shoulder Stretches Bicep stretch against the wall 3-way x 30 sec ea.    Other Shoulder Stretches upper trap stretch 2 x 30 sec     Moist Heat Therapy   Number Minutes Moist Heat 10 Minutes   Moist Heat Location Shoulder     Manual Therapy   Manual Therapy Scapular mobilization   Manual therapy comments manual trigger point release x 3 in the L  upper trap   Joint Mobilization grade 3 anterior mobs    Scapular Mobilization upward rotation with pt active lifting in reaching in to cabinet                  PT Short Term Goals - 02/20/16 1509      PT SHORT TERM GOAL #1   Title pt will be I with inital HEP (02/24/2016)   Baseline independent   Time 4   Period Weeks   Status Achieved     PT SHORT TERM GOAL #2   Title pt will be able to verbalize and demostrate techniques to reduce R shoulder reinjury via postural awarenss,lifting and  carrying mechanics, and HEP (02/24/2015)   Time 4   Period Weeks   Status Unable to assess     PT SHORT TERM GOAL #3   Title she will improve L shoulder AROM abduction/ flexion and ER by >/= 10 degrees with </= 5/10 pain to promote mobility (02/24/2015)   Time 4   Period Weeks   Status Unable to assess     PT SHORT TERM GOAL #4   Title she will increase L grip strength by >/= 4# to demonstrate improvement in shoulder function (02/24/2015)   Time 4   Period Weeks   Status Unable to assess           PT Long Term Goals - 01/24/16 1033      PT LONG TERM GOAL #1   Title pt will be i with all HEP given throughout therapy (03/20/2016)   Time 8   Period Weeks   Status New     PT LONG TERM GOAL #2   Title she will demonstrate improve L shoulder AROM by to >/= 110  degrees with flexion/ abduction and >/= 50 degrees with ER with </=2/10 pain to assist functional mobility  (12/22/2014)   Time 8   Period Weeks   Status New     PT LONG TERM GOAL #3   Title she will increase L shoulder strength to >/= 4/5 in all planes to assist with ADLs and job related tasks (12/22/2014)   Time 8   Period Weeks     PT LONG TERM GOAL #4   Title she will be able to lift and carry >/= 5# overhead with </= 2/10 pain for L shoulder to promote lifting associated with job related activities (12/22/2014)   Time 8   Period Weeks   Status New     PT LONG TERM GOAL #5   Title pt will increase her FOTO score to >/= 34% limited to demonstrate improved function at discharge (12/22/2014)   Time 6   Period Weeks   Status New               Plan - 02/22/16 1547    Clinical Impression Statement [name] continues to make progress with PT. continued focus on strengthening of the scapular stabilizers and rotator cuff which she demonstrated difficulty with External rotation in standing which was moved to sidelying. following mobs she was able to put her hand on the back of her head. continued MHP post session to clam down soreness.  plan to drop down to 1 x a week for the next 4 weeks.    PT Treatment/Interventions ADLs/Self Care Home Management;Cryotherapy;Electrical Stimulation;Iontophoresis 4mg /ml Dexamethasone;Moist Heat;Ultrasound;Therapeutic activities;Therapeutic exercise;Dry needling;Manual techniques;Neuromuscular re-education;Patient/family education;Taping;Passive range of motion   PT Next Visit Plan , mobs for GHJ, scapular stabilization ther ex, modalities, strengthening of IR/ER, arm bike, serratus anterior strengthening, , thoracic foam roll series,  patient wants to be able to use arn to curl the back of her head. Check grip and specific goals.   PT Home Exercise Plan rows, table slides for flexion/ abduction, wand ER, ceiling punches.  narrow grip, horizontal abd,  yellow band,  ER bilateral No band,   Consulted and Agree with Plan of Care Patient      Patient will  benefit from skilled therapeutic intervention in order to improve the following deficits and impairments:     Visit Diagnosis: Chronic left shoulder pain  Muscle weakness (generalized)  Stiffness of left shoulder, not elsewhere classified  Abnormal posture     Problem List Patient Active Problem List   Diagnosis Date Noted  . Impingement syndrome of shoulder, left 12/12/2015   [name] PT, DPT, LAT, ATC  02/22/16  4:18 PM      [facility] [address] [place], [place], [address] Phone: [phone]   Fax:  [phone]  Name: Brandy Delgado MRN: [id number] Date of Birth: 1946-01-13

## 2016-02-29 ENCOUNTER — Ambulatory Visit: Payer: Medicare Other | Admitting: Physical Therapy

## 2016-02-29 DIAGNOSIS — R29898 Other symptoms and signs involving the musculoskeletal system: Secondary | ICD-10-CM

## 2016-02-29 DIAGNOSIS — M25612 Stiffness of left shoulder, not elsewhere classified: Secondary | ICD-10-CM

## 2016-02-29 DIAGNOSIS — M6281 Muscle weakness (generalized): Secondary | ICD-10-CM

## 2016-02-29 DIAGNOSIS — M25512 Pain in left shoulder: Secondary | ICD-10-CM | POA: Diagnosis not present

## 2016-02-29 DIAGNOSIS — R293 Abnormal posture: Secondary | ICD-10-CM

## 2016-02-29 DIAGNOSIS — G8929 Other chronic pain: Secondary | ICD-10-CM

## 2016-02-29 DIAGNOSIS — M25611 Stiffness of right shoulder, not elsewhere classified: Secondary | ICD-10-CM

## 2016-02-29 NOTE — Therapy (Signed)
[place], [place], [address] Phone: [phone]   Fax:  [phone]  Physical Therapy Treatment  Patient Details  Name: Brandy Delgado MRN: [id number] Date of Birth: Aug 27, 1945 Referring Provider: Meridee Score MD  Encounter Date: 02/29/2016      PT End of Session - 02/29/16 1809    Visit Number 8   Number of Visits 17   Date for PT Re-Evaluation 03/20/16   PT Start Time [id number]   PT Stop Time 1500   PT Time Calculation (min) 43 min   Activity Tolerance Patient tolerated treatment well   Behavior During Therapy [facility] for tasks assessed/performed      Past Medical History:  Diagnosis Date  . Thyroid disease     No past surgical history on file.  There were no vitals filed for this visit.      Subjective Assessment - 02/29/16 1434    Subjective Doing OK.  Arm gets tired when working in one spot with elbows out.   Some days I don't have any pain.  Pain returns  on the days I work about half way through the day.    Currently in Pain? Yes   Pain Score 0-No pain  up to 5/10   Pain Location Shoulder   Pain Orientation Left   Pain Descriptors / Indicators Aching;Sore;Squeezing  steady   Pain Radiating Towards left biceps ,  anterior shoulder   Pain Frequency Intermittent   Aggravating Factors  longer day at work   Pain Relieving Factors heat helps   Effect of Pain on Daily Activities limts activities            [facility] PT Assessment - 02/29/16 0001      AROM   Left Shoulder Flexion 136 Degrees   Left Shoulder ABduction 148 Degrees   Left Shoulder External Rotation 30 Degrees     Strength   Left Hand Grip (lbs) --  50,52, 51                     [facility] Adult PT Treatment/Exercise - 02/29/16 0001      Shoulder Exercises: Supine   Protraction Weight (lbs) 1.2 LBs, both and alternatinng.    Other Supine Exercises 2 LBs 10 X2 sets     Shoulder Exercises: Standing   Other  Standing Exercises Ball on wall, 15 seconds:  presses, flexion /extension,  circles (2 ways) and horizontal abduction adduction.     Shoulder Exercises: ROM/Strengthening   UBE (Upper Arm Bike) L1.5 x 8 min  changing direction at 4 min   Other ROM/Strengthening Exercises wall lader 5 x flexoin/ abuction with controlled eccentric lowering     Shoulder Exercises: Isometric Strengthening   External Rotation Limitations Isometric with yellow bamd 10 X                  PT Short Term Goals - 02/29/16 1812      PT SHORT TERM GOAL #1   Title pt will be I with inital HEP (02/24/2016)   Time 4   Period Weeks   Status Achieved     PT SHORT TERM GOAL #2   Title pt will be able to verbalize and demostrate techniques to reduce R shoulder reinjury via postural awarenss,lifting and carrying mechanics, and HEP (02/24/2015)   Baseline able to verbalize multiple stratigies to accomplish   Time 4   Period Weeks   Status Achieved  PT SHORT TERM GOAL #3   Title she will improve L shoulder AROM abduction/ flexion and ER by >/= 10 degrees with </= 5/10 pain to promote mobility (02/24/2015)   Baseline Improving.  minor soreness only with some ROM.     Time 4   Period Weeks   Status On-going     PT SHORT TERM GOAL #4   Title she will increase L grip strength by >/= 4# to demonstrate improvement in shoulder function (02/24/2015)   Baseline No t yet 4 LBS improved.   50, 52, 51 LBS(improved)   Time 4   Period Weeks   Status On-going           PT Long Term Goals - 01/24/16 1033      PT LONG TERM GOAL #1   Title pt will be i with all HEP given throughout therapy (03/20/2016)   Time 8   Period Weeks   Status New     PT LONG TERM GOAL #2   Title she will demonstrate improve L shoulder AROM by to >/= 110  degrees with flexion/ abduction and >/= 50 degrees with ER with </=2/10 pain to assist functional mobility (12/22/2014)   Time 8   Period Weeks   Status New     PT LONG TERM GOAL #3    Title she will increase L shoulder strength to >/= 4/5 in all planes to assist with ADLs and job related tasks (12/22/2014)   Time 8   Period Weeks     PT LONG TERM GOAL #4   Title she will be able to lift and carry >/= 5# overhead with </= 2/10 pain for L shoulder to promote lifting associated with job related activities (12/22/2014)   Time 8   Period Weeks   Status New     PT LONG TERM GOAL #5   Title pt will increase her FOTO score to >/= 34% limited to demonstrate improved function at discharge (12/22/2014)   Time 6   Period Weeks   Status New               Plan - 02/29/16 1809    Clinical Impression Statement Strengthening and endurance focus of todays session as these seem to be her main issues.   AROM with flexion and abduction improving,  see flow sheet.  Grip Left 50, 52, 51 LBS.   PT Next Visit Plan    FOTO   continue strengthening.  Add triceps supine to HEP.  consider rolling weight attached to rope (similar to rolling hair)   PT Home Exercise Plan rows, table slides for flexion/ abduction, wand ER, ceiling punches.  narrow grip, horizontal abd,  yellow band,  ER bilateral No band,   Consulted and Agree with Plan of Care Patient      Patient will benefit from skilled therapeutic intervention in order to improve the following deficits and impairments:  Decreased endurance, Decreased activity tolerance, Postural dysfunction, Improper body mechanics, Pain, Increased fascial restricitons, Impaired UE functional use, Decreased range of motion, Decreased strength  Visit Diagnosis: Chronic left shoulder pain  Muscle weakness (generalized)  Stiffness of left shoulder, not elsewhere classified  Abnormal posture  Right arm weakness  Decreased ROM of right shoulder     Problem List Patient Active Problem List   Diagnosis Date Noted  . Impingement syndrome of shoulder, left 12/12/2015    Brandy Delgado PTA 02/29/2016, 6:16 PM  [place]  [place], [place], [address] Phone: [phone]   Fax:  [phone]  Name: Brandy Delgado MRN: [id number] Date of Birth: 21-Feb-1945

## 2016-03-07 ENCOUNTER — Ambulatory Visit: Payer: Medicare Other | Admitting: Physical Therapy

## 2016-03-07 DIAGNOSIS — M25512 Pain in left shoulder: Principal | ICD-10-CM

## 2016-03-07 DIAGNOSIS — M25612 Stiffness of left shoulder, not elsewhere classified: Secondary | ICD-10-CM

## 2016-03-07 DIAGNOSIS — G8929 Other chronic pain: Secondary | ICD-10-CM

## 2016-03-07 DIAGNOSIS — M6281 Muscle weakness (generalized): Secondary | ICD-10-CM

## 2016-03-07 DIAGNOSIS — R293 Abnormal posture: Secondary | ICD-10-CM

## 2016-03-07 NOTE — Therapy (Signed)
Brandy Delgado, [place], [address] Phone: [phone]   Fax:  [phone]  Physical Therapy Treatment  Patient Details  Name: Brandy Delgado MRN: [id number] Date of Birth: 1945/08/21 Referring Provider: Meridee Score MD  Encounter Date: 03/07/2016      PT End of Session - 03/07/16 1735    Visit Number 9   Number of Visits 17   Date for PT Re-Evaluation 03/20/16   PT Start Time [id number]   PT Stop Time 1640   PT Time Calculation (min) 55 min   Activity Tolerance Patient tolerated treatment well   Behavior During Therapy [facility] for tasks assessed/performed      Past Medical History:  Diagnosis Date  . Thyroid disease     No past surgical history on file.  There were no vitals filed for this visit.      Subjective Assessment - 03/07/16 1549    Subjective "I am doing okay, but the weather is making me sore all over. Been doing a lot of moving recently because I am putting my house on the market. Pain still bothering me about halfway through my workday."   Currently in Pain? Yes   Pain Score 0-No pain  slept on it last night and it felt like a 10/10 when I woke up   Pain Location Shoulder   Pain Orientation Left   Pain Descriptors / Indicators Aching;Sore   Pain Radiating Towards left biceps   Aggravating Factors  long work days   Pain Relieving Factors heat                         [facility] Adult PT Treatment/Exercise - 03/07/16 0001      Shoulder Exercises: Supine   Horizontal ABduction 15 reps;Both;Theraband  2 sets   Theraband Level (Shoulder Horizontal ABduction) Level 3 (Green)   Other Supine Exercises ceiling punches 2 x 15 with 2#; horizontal abduction  2 x 15 with green theraband     Shoulder Exercises: Standing   External Rotation Left;5 reps;Theraband  stopped due to pain and improper form; yellow band   Row 15 reps;Theraband;Both;Strengthening  2 sets   Theraband Level  (Shoulder Row) Level 3 (Green)     Shoulder Exercises: ROM/Strengthening   UBE (Upper Arm Bike) L1.5 x 8 min  changing direction at 4 minutes   Other ROM/Strengthening Exercises wall lader 5 x flexoin/ abuction with controlled eccentric lowering     Moist Heat Therapy   Number Minutes Moist Heat 10 Minutes   Moist Heat Location Shoulder     Manual Therapy   Manual therapy comments manual trigger point release in L upper trap   Joint Mobilization grade 3 inferior, anterior, and posterior GHJ mobs; distraction during flexion    Scapular Mobilization upward rotation                  PT Short Term Goals - 02/29/16 1812      PT SHORT TERM GOAL #1   Title pt will be I with inital HEP (02/24/2016)   Time 4   Period Weeks   Status Achieved     PT SHORT TERM GOAL #2   Title pt will be able to verbalize and demostrate techniques to reduce R shoulder reinjury via postural awarenss,lifting and carrying mechanics, and HEP (02/24/2015)   Baseline able to verbalize multiple stratigies to accomplish   Time 4   Period Weeks   Status  Achieved     PT SHORT TERM GOAL #3   Title she will improve L shoulder AROM abduction/ flexion and ER by >/= 10 degrees with </= 5/10 pain to promote mobility (02/24/2015)   Baseline Improving.  minor soreness only with some ROM.     Time 4   Period Weeks   Status On-going     PT SHORT TERM GOAL #4   Title she will increase L grip strength by >/= 4# to demonstrate improvement in shoulder function (02/24/2015)   Baseline No t yet 4 LBS improved.   50, 52, 51 LBS(improved)   Time 4   Period Weeks   Status On-going           PT Long Term Goals - 01/24/16 1033      PT LONG TERM GOAL #1   Title pt will be i with all HEP given throughout therapy (03/20/2016)   Time 8   Period Weeks   Status New     PT LONG TERM GOAL #2   Title she will demonstrate improve L shoulder AROM by to >/= 110  degrees with flexion/ abduction and >/= 50 degrees with ER with  </=2/10 pain to assist functional mobility (12/22/2014)   Time 8   Period Weeks   Status New     PT LONG TERM GOAL #3   Title she will increase L shoulder strength to >/= 4/5 in all planes to assist with ADLs and job related tasks (12/22/2014)   Time 8   Period Weeks     PT LONG TERM GOAL #4   Title she will be able to lift and carry >/= 5# overhead with </= 2/10 pain for L shoulder to promote lifting associated with job related activities (12/22/2014)   Time 8   Period Weeks   Status New     PT LONG TERM GOAL #5   Title pt will increase her FOTO score to >/= 34% limited to demonstrate improved function at discharge (12/22/2014)   Time 6   Period Weeks   Status New               Plan - 03/07/16 1737    Clinical Impression Statement [name] reported no pain at the start of treatment, but says she has been trying to use that arm more often and is in the process of moving. Focused on GHJ mobs for flexion and abduction. During standing exercises she had to be reminded to keep her shoulders down, and could not perform ER at all without hiking shoulder. Finished session by focusing on manual trigger point release in the L upper trap. Continued with MHP post session.    PT Next Visit Plan    FOTO   continue strengthening.  Add triceps supine to HEP.  consider rolling weight attached to rope (similar to rolling hair)   PT Home Exercise Plan rows, table slides for flexion/ abduction, wand ER, ceiling punches.  narrow grip, horizontal abd,  yellow band,  ER bilateral No band,   Consulted and Agree with Plan of Care Patient      Patient will benefit from skilled therapeutic intervention in order to improve the following deficits and impairments:  Decreased endurance, Decreased activity tolerance, Postural dysfunction, Improper body mechanics, Pain, Increased fascial restricitons, Impaired UE functional use, Decreased range of motion, Decreased strength  Visit Diagnosis: Chronic left  shoulder pain  Muscle weakness (generalized)  Stiffness of left shoulder, not elsewhere classified  Abnormal posture  Problem List Patient Active Problem List   Diagnosis Date Noted  . Impingement syndrome of shoulder, left 12/12/2015    Alda Ponder, SPT 03/07/2016, 5:42 PM  [place], [place], [address] Phone: [phone]   Fax:  [phone]  Name: Brandy Delgado MRN: [id number] Date of Birth: [birth year]

## 2016-03-14 ENCOUNTER — Ambulatory Visit: Payer: Medicare Other | Admitting: Physical Therapy

## 2016-03-20 ENCOUNTER — Ambulatory Visit: Payer: Medicare Other | Attending: Orthopedic Surgery | Admitting: Physical Therapy

## 2016-03-20 DIAGNOSIS — R293 Abnormal posture: Secondary | ICD-10-CM | POA: Insufficient documentation

## 2016-03-20 DIAGNOSIS — M25612 Stiffness of left shoulder, not elsewhere classified: Secondary | ICD-10-CM | POA: Diagnosis present

## 2016-03-20 DIAGNOSIS — G8929 Other chronic pain: Secondary | ICD-10-CM | POA: Insufficient documentation

## 2016-03-20 DIAGNOSIS — M25512 Pain in left shoulder: Secondary | ICD-10-CM | POA: Diagnosis not present

## 2016-03-20 DIAGNOSIS — M6281 Muscle weakness (generalized): Secondary | ICD-10-CM | POA: Insufficient documentation

## 2016-03-20 NOTE — Therapy (Signed)
[place], [place], [address] Phone: [phone]   Fax:  [phone]  Physical Therapy Treatment / Discharge Note  Patient Details  Name: Brandy Delgado MRN: [id number] Date of Birth: 03/11/1945 Referring Provider: Meridee Score MD  Encounter Date: 03/20/2016      PT End of Session - 03/20/16 1455    Visit Number 10   Number of Visits 17   Date for PT Re-Evaluation 03/20/16   Authorization Type Medicare: Kx modifier by 15th visit, progress note by 10th visit   PT Start Time 1450   PT Stop Time 1529   PT Time Calculation (min) 39 min   Activity Tolerance Patient tolerated treatment well   Behavior During Therapy [facility] for tasks assessed/performed      Past Medical History:  Diagnosis Date  . Thyroid disease     No past surgical history on file.  There were no vitals filed for this visit.      Subjective Assessment - 03/20/16 1452    Subjective "I am doing better, I do have some days where I am more sore and days where I am doing well"    Currently in Pain? Yes   Pain Score 0-No pain   Pain Location Shoulder   Pain Orientation Left   Aggravating Factors  rolling onto the L shoulder   Pain Relieving Factors heat,            [facility] PT Assessment - 03/20/16 1505      Observation/Other Assessments   Focus on Therapeutic Outcomes (FOTO)  33% limited     AROM   Left Shoulder Extension 56 Degrees   Left Shoulder Flexion 145 Degrees   Left Shoulder ABduction 146 Degrees   Left Shoulder Internal Rotation 70 Degrees   Left Shoulder External Rotation 55 Degrees     Strength   Left Shoulder Flexion 3+/5   Left Shoulder Extension 4/5   Left Shoulder ABduction 4-/5   Left Shoulder Internal Rotation 4/5   Left Shoulder External Rotation 3/5   Left Hand Grip (lbs) 59.3  60,58,60                     [facility] Adult PT Treatment/Exercise - 03/20/16 1454      Shoulder Exercises:  ROM/Strengthening   UBE (Upper Arm Bike) L2 x 8 min  changing direction at 4 min     Shoulder Exercises: Stretch   Other Shoulder Stretches sleeper stretch 2 x 30   Other Shoulder Stretches upper trap stretch 2 x 30 sec                PT Education - 03/20/16 1528    Education provided Yes   Education Details reviewed previously provied HEP, and updated HEP for continued shoulder strenghtening. How to progress strengthening by increasing reps/ sets to continued to promote progression and perform on both shoulders.    Person(s) Educated Patient   Methods Explanation;Verbal cues;Handout   Comprehension Verbalized understanding;Verbal cues required          PT Short Term Goals - 03/20/16 1512      PT SHORT TERM GOAL #1   Title pt will be I with inital HEP (02/24/2016)   Time 4   Status Achieved     PT SHORT TERM GOAL #2   Title pt will be able to verbalize and demostrate techniques to reduce R shoulder reinjury via postural awarenss,lifting and carrying mechanics, and HEP (  02/24/2015)   Time 4   Period Weeks   Status Achieved     PT SHORT TERM GOAL #3   Title she will improve L shoulder AROM abduction/ flexion and ER by >/= 10 degrees with </= 5/10 pain to promote mobility (02/24/2015)   Time 4   Period Weeks   Status Partially Met     PT SHORT TERM GOAL #4   Title she will increase L grip strength by >/= 4# to demonstrate improvement in shoulder function (02/24/2015)   Time 4   Period Weeks   Status Achieved           PT Long Term Goals - 03/20/16 1513      PT LONG TERM GOAL #1   Title pt will be i with all HEP given throughout therapy (03/20/2016)   Time 8   Period Weeks   Status Achieved     PT LONG TERM GOAL #2   Title she will demonstrate improve L shoulder AROM by to >/= 110  degrees with flexion/ abduction and >/= 50 degrees with ER with </=2/10 pain to assist functional mobility (12/22/2014)   Time 8   Period Weeks   Status Achieved     PT LONG TERM  GOAL #3   Title she will increase L shoulder strength to >/= 4/5 in all planes to assist with ADLs and job related tasks (12/22/2014)   Period Weeks   Status Partially Met     PT LONG TERM GOAL #4   Title she will be able to lift and carry >/= 5# overhead with </= 2/10 pain for L shoulder to promote lifting associated with job related activities (12/22/2014)   Time 8   Period Weeks   Status Achieved     PT LONG TERM GOAL #5   Title pt will increase her FOTO score to >/= 34% limited to demonstrate improved function at discharge (12/22/2014)   Time 6   Period Weeks   Status Achieved               Plan - 03/20/16 1530    Clinical Impression Statement Brandy Delgado has made great progress improving shoulder mobility and strength and additionally is pain free. she reports doing her exericses to calm down her pain. she met or partially met all goals today. She is able to maintain and progress her current level of function independently and will be discharged from PT today.    PT Next Visit Plan d/c   Consulted and Agree with Plan of Care Patient      Patient will benefit from skilled therapeutic intervention in order to improve the following deficits and impairments:  Decreased endurance, Decreased activity tolerance, Postural dysfunction, Improper body mechanics, Pain, Increased fascial restricitons, Impaired UE functional use, Decreased range of motion, Decreased strength  Visit Diagnosis: Chronic left shoulder pain  Muscle weakness (generalized)  Stiffness of left shoulder, not elsewhere classified  Abnormal posture       G-Codes - 03/20/16 1538    Functional Assessment Tool Used (Outpatient Only) clinical judgement/ FOTO    Functional Limitation Carrying, moving and handling objects   Carrying, Moving and Handling Objects Goal Status (G8985) At least 20 percent but less than 40 percent impaired, limited or restricted   Carrying, Moving and Handling Objects Discharge Status  (G8986) At least 20 percent but less than 40 percent impaired, limited or restricted      Problem List Patient Active Problem List   Diagnosis Date   Noted  . Impingement syndrome of shoulder, left 12/12/2015     PT, DPT, LAT, ATC  03/20/16  3:39 PM      [facility] [address] [place], [place], [address] Phone: [phone]   Fax:  [phone]  Name: Brandy Delgado MRN: [id number] Date of Birth: 07/20/1945      PHYSICAL THERAPY DISCHARGE SUMMARY  Visits from Start of Care: 10  Current functional level related to goals / functional outcomes: 33% limited     Remaining deficits: Mild limitation in the L shoulder compared bil. Weakness in the L shoulder with minimal soreness during testing. Intermittent tightness/ soreness in the AM that is relieved with stretching   Education / Equipment: HEP, posture, lifting biomechanics, theraband  Plan: Patient agrees to discharge.  Patient goals were partially met. Patient is being discharged due to being pleased with the current functional level.  ?????      PT, DPT, LAT, ATC  03/20/16  3:41 PM       

## 2016-03-21 ENCOUNTER — Ambulatory Visit: Payer: Medicare Other | Admitting: Physical Therapy

## 2016-08-16 IMAGING — MR MR SHOULDER*R* W/O CM
5 series · 36 of 40 positions shown · non-contrast
Comparison: None.

CLINICAL DATA: Right shoulder pain since a fall in August 2014.
Limited range of motion. Initial encounter.

EXAM:
MRI OF THE RIGHT SHOULDER WITHOUT CONTRAST
TECHNIQUE: Multiplanar, multisequence MR imaging of the shoulder was performed.
No intravenous contrast was administered.

[Series 3: T2 fat-sat · axial · 4.0mm · 0.55mm/px · z∈[-23,+53]mm · 8 of 17 slices shown (1 of 3)]
[im 1/17]
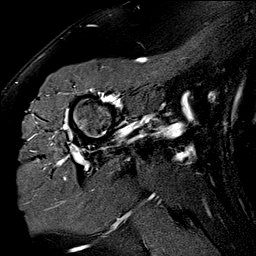
[im 3/17]
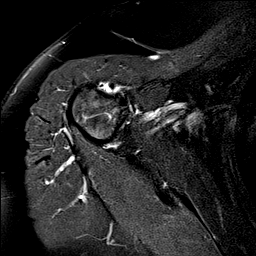
[im 5/17]
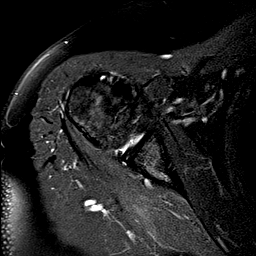
[im 7/17]
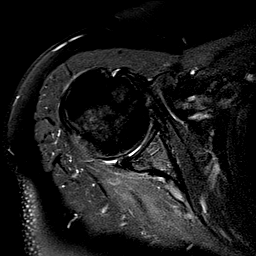
[im 10/17]
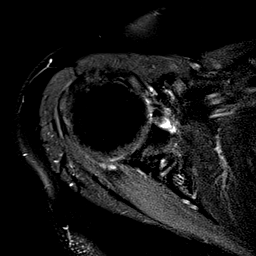
[im 12/17]
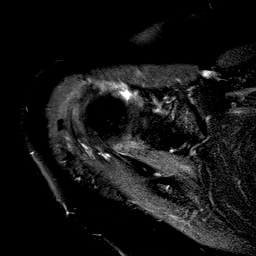
[im 14/17]
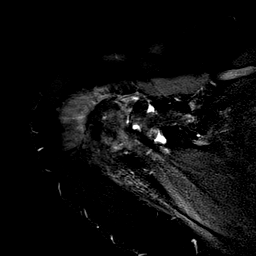
[im 17/17]
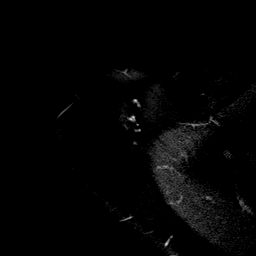

[Series 4: T2 fat-sat · oblique · 4.0mm · 0.55mm/px · 8 of 18 slices shown (2 of 3)]
[im 1/18]
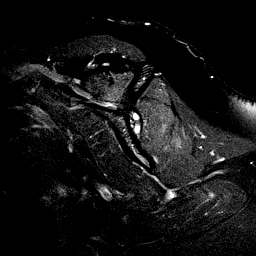
[im 3/18]
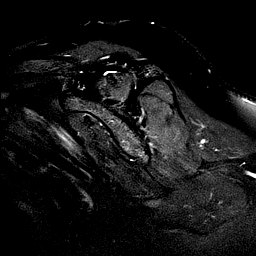
[im 5/18]
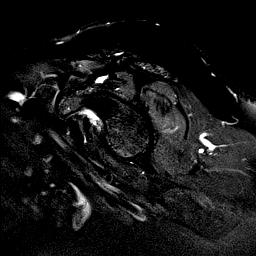
[im 8/18]
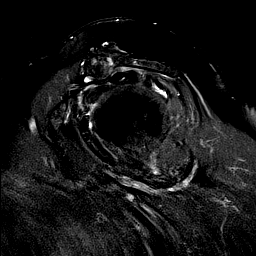
[im 10/18]
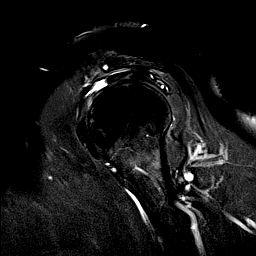
[im 13/18]
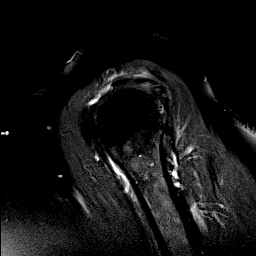
[im 15/18]
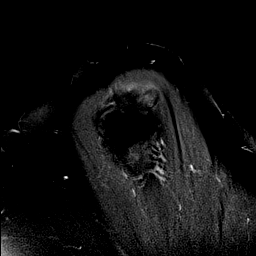
[im 18/18]
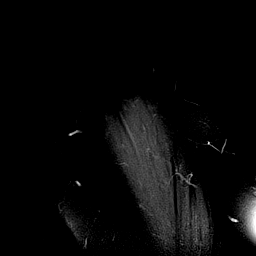

[Series 5: T2 fat-sat · oblique · 4.0mm · 0.27mm/px · 8 of 17 slices shown (3 of 3)]
[im 1/17]
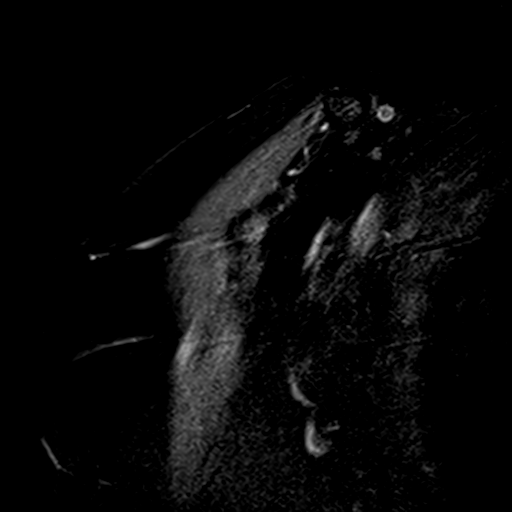
[im 3/17]
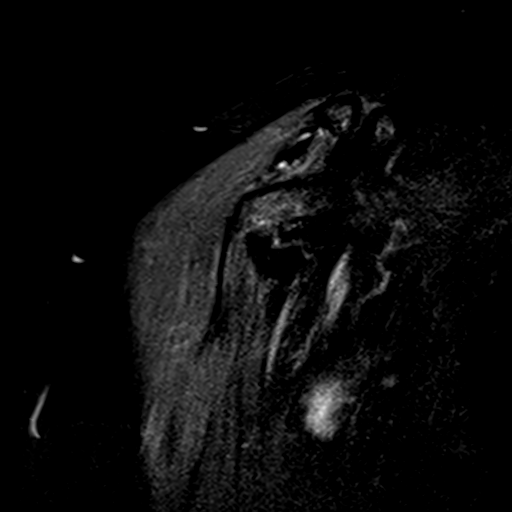
[im 5/17]
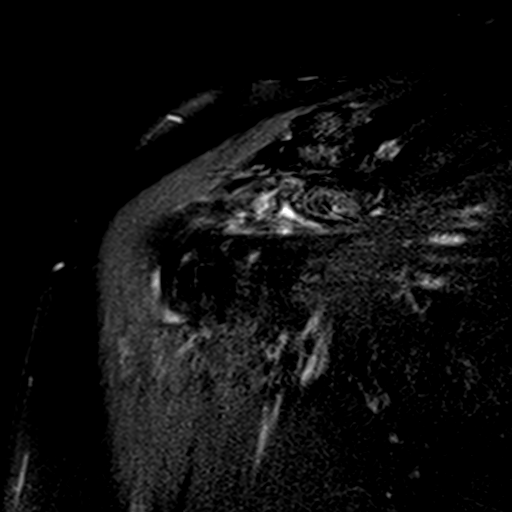
[im 7/17]
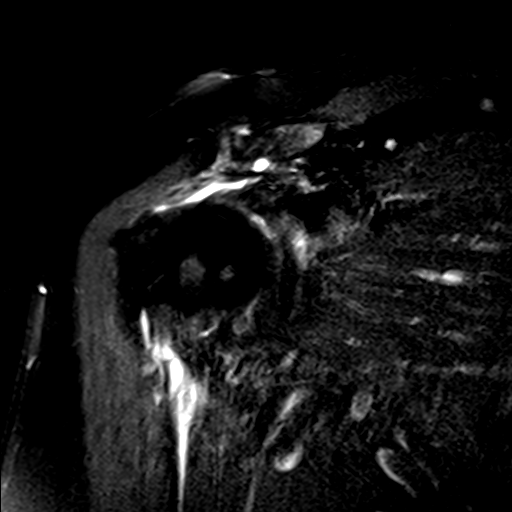
[im 10/17]
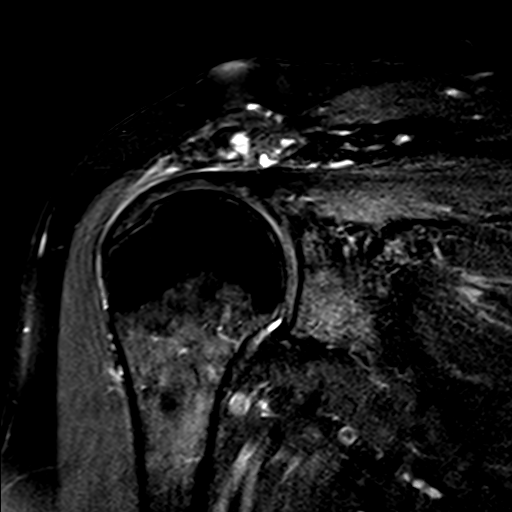
[im 12/17]
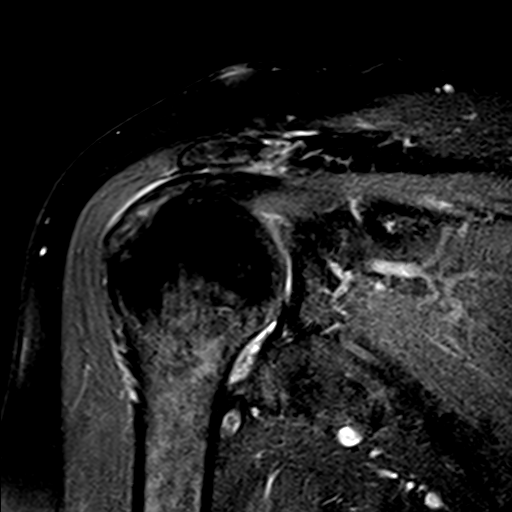
[im 14/17]
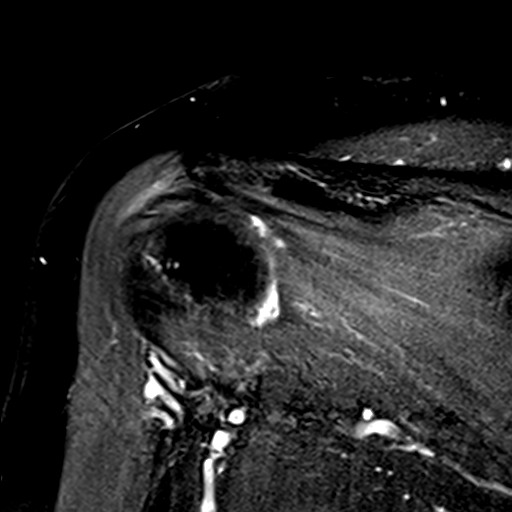
[im 17/17]
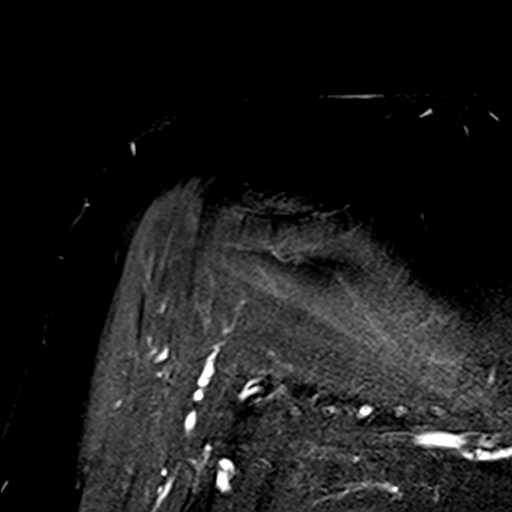

[Series 6: T1 · oblique · 4.0mm · 0.27mm/px · 4 of 18 slices shown]
[im 1/18]
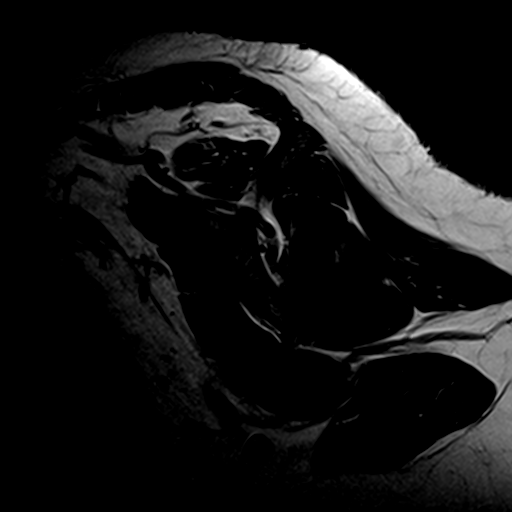
[im 3/18]
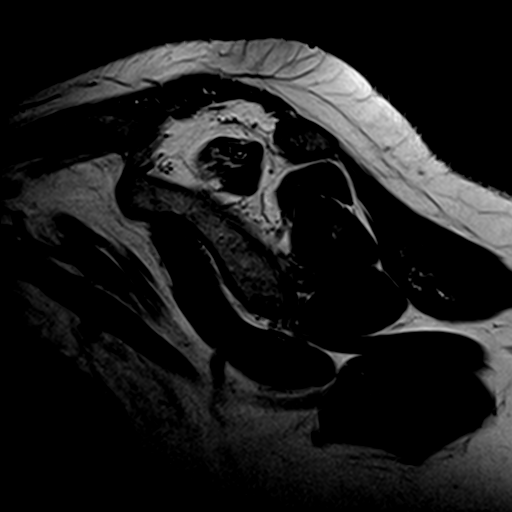
[im 5/18]
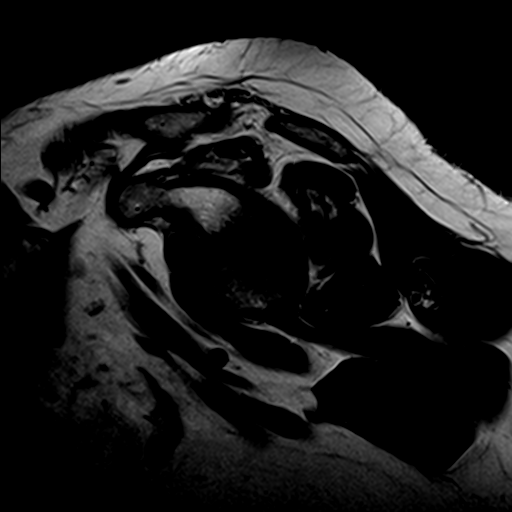
[im 8/18]
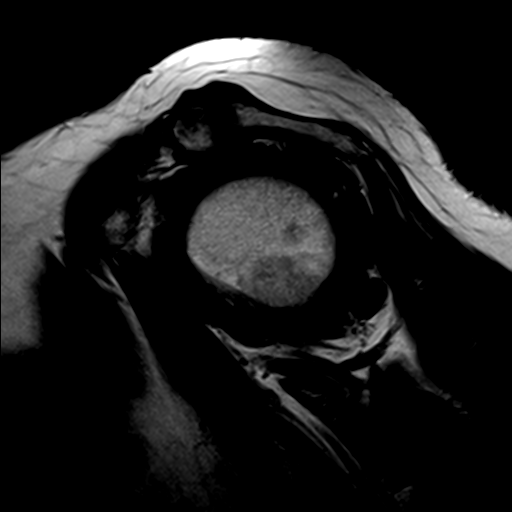

[Series 8: PD · oblique · 4.0mm · 0.55mm/px · 8 of 17 slices shown]
[im 1/17]
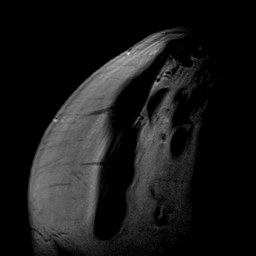
[im 3/17]
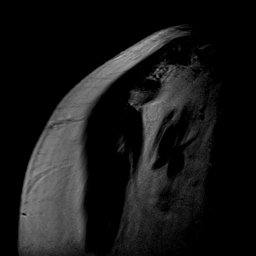
[im 5/17]
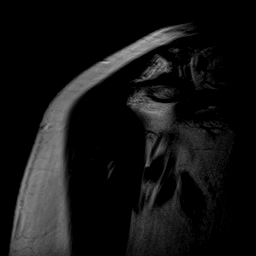
[im 7/17]
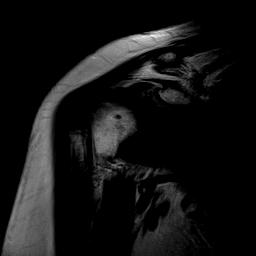
[im 10/17]
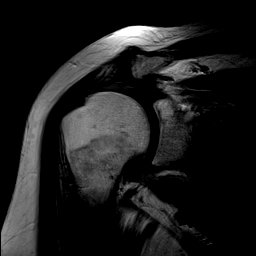
[im 12/17]
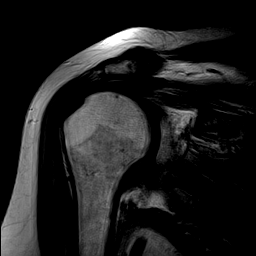
[im 14/17]
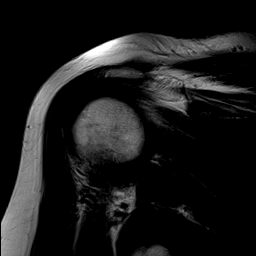
[im 17/17]
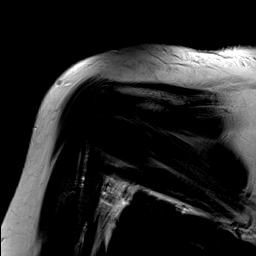

[36 of 40 positions shown; findings below may reference images not displayed]

FINDINGS: Rotator cuff: There is rotator cuff tendinopathy. The patient has a
full-thickness tear of the supraspinatus 1.5 cm medial to the
tendon's insertion on the greater tuberosity. The tear measures
cm from front to back with retraction of 2-2.5 cm identified.

Muscles:  No focal atrophy or lesion.

Biceps long head: Intrasubstance increased T2 signal within the
intra-articular segment of the tendon consistent with tendinopathy
is identified. No tear.

Acromioclavicular Joint: Bulky acromioclavicular degenerative change
is seen.

Glenohumeral Joint: Unremarkable.

Labrum:  Appears intact.

Bones: No fracture or worrisome marrow lesion. The acromion is type
2.
IMPRESSION: Rotator cuff tendinopathy with a partial width, full-thickness tear
of the supraspinatus 1.5 cm medial to the tendon's insertion on the
greater tuberosity. The tear measures 1.5 cm from front to back with
retraction of 2-2.5 cm.

Bulky acromioclavicular osteoarthritis.

Tendinopathy of the intra-articular long head of biceps without
tear.

## 2016-09-25 ENCOUNTER — Ambulatory Visit (INDEPENDENT_AMBULATORY_CARE_PROVIDER_SITE_OTHER): Payer: Medicare Other | Admitting: Orthopedic Surgery

## 2016-09-25 DIAGNOSIS — M7542 Impingement syndrome of left shoulder: Secondary | ICD-10-CM

## 2016-09-25 DIAGNOSIS — M7541 Impingement syndrome of right shoulder: Secondary | ICD-10-CM | POA: Diagnosis not present

## 2016-09-25 MED ORDER — METHYLPREDNISOLONE ACETATE 40 MG/ML IJ SUSP
40.0000 mg | INTRAMUSCULAR | Status: AC | PRN
  Administered 2016-09-25: 40 mg via INTRA_ARTICULAR

## 2016-09-25 MED ORDER — LIDOCAINE HCL 1 % IJ SOLN
5.0000 mL | INTRAMUSCULAR | Status: AC | PRN
  Administered 2016-09-25: 5 mL

## 2016-09-25 NOTE — Progress Notes (Signed)
Office Visit Note   [name]           Date of Birth: 11/08/1945           MRN: [id number] Visit Date: 09/25/2016              Requested by: No referring provider defined for this encounter. PCP: Patient, No Pcp Per  No chief complaint on file.     HPI: Patient is a 71 year old woman who presents for evaluation for bilateral shoulder pain right worse than left. Patient does work with with her arms elevated and is having increasing pain. She feels that there is something turning in her shoulder joint.  Assessment & Plan: Visit Diagnoses:  1. Impingement syndrome of left shoulder   2. Impingement syndrome of right shoulder     Plan: Both shoulders were injected. Patient will follow-up as needed.  Follow-Up Instructions: Return if symptoms worsen or fail to improve.   Ortho Exam  Patient is alert, oriented, no adenopathy, well-dressed, normal affect, normal respiratory effort. Examination patient has full range of motion about the shoulder she has pain with Neer and Hawkins impingement test bilaterally is tender to palpation over the before meals joint bilaterally and is tender to palpation over the biceps tendon.  Imaging: No results found. No images are attached to the encounter.  Labs: Lab Results  Component Value Date   ESRSEDRATE 20 02/11/2013    Orders:  No orders of the defined types were placed in this encounter.  No orders of the defined types were placed in this encounter.    Procedures: Large Joint Inj Date/Time: 09/25/2016 3:11 PM Performed by: Ebert Forrester V Authorized by: Newt Minion   Consent Given by:  Patient Site marked: the procedure site was marked   Timeout: prior to procedure the correct patient, procedure, and site was verified   Indications:  Pain and diagnostic evaluation Location:  Shoulder Site:  R subacromial bursa Prep: patient was prepped and draped in usual sterile fashion   Needle Size:  22 G Needle Length:   1.5 inches Approach:  Posterior Ultrasound Guidance: No   Fluoroscopic Guidance: No   Arthrogram: No   Medications:  5 mL lidocaine 1 %; 40 mg methylPREDNISolone acetate 40 MG/ML Aspiration Attempted: No   Patient tolerance:  Patient tolerated the procedure well with no immediate complications Large Joint Inj Date/Time: 09/25/2016 3:12 PM Performed by: Hollyn Stucky V Authorized by: Newt Minion   Consent Given by:  Patient Site marked: the procedure site was marked   Timeout: prior to procedure the correct patient, procedure, and site was verified   Indications:  Pain and diagnostic evaluation Location:  Shoulder Site:  L subacromial bursa Prep: patient was prepped and draped in usual sterile fashion   Needle Size:  22 G Needle Length:  1.5 inches Approach:  Posterior Ultrasound Guidance: No   Fluoroscopic Guidance: No   Arthrogram: No   Medications:  5 mL lidocaine 1 %; 40 mg methylPREDNISolone acetate 40 MG/ML Aspiration Attempted: No   Patient tolerance:  Patient tolerated the procedure well with no immediate complications    Clinical Data: No additional findings.  ROS:  All other systems negative, except as noted in the HPI. Review of Systems  Objective: Vital Signs: There were no vitals taken for this visit.  Specialty Comments:  No specialty comments available.  PMFS History: Patient Active Problem List   Diagnosis Date Noted  . Impingement syndrome of right  shoulder 09/25/2016  . Impingement syndrome of left shoulder 12/12/2015   Past Medical History:  Diagnosis Date  . Thyroid disease     No family history on file.  No past surgical history on file. Social History   Occupational History  . Not on file.   Social History Main Topics  . Smoking status: Never Smoker  . Smokeless tobacco: Not on file  . Alcohol use No  . Drug use: No  . Sexual activity: Not on file

## 2017-05-21 ENCOUNTER — Ambulatory Visit (INDEPENDENT_AMBULATORY_CARE_PROVIDER_SITE_OTHER): Payer: Medicare Other | Admitting: Physical Medicine and Rehabilitation

## 2017-05-21 ENCOUNTER — Encounter

## 2017-05-21 ENCOUNTER — Encounter (INDEPENDENT_AMBULATORY_CARE_PROVIDER_SITE_OTHER): Payer: Self-pay | Admitting: Physical Medicine and Rehabilitation

## 2017-05-21 VITALS — BP 139/79 | HR 69 | Temp 98.4°F

## 2017-05-21 DIAGNOSIS — M545 Low back pain: Secondary | ICD-10-CM

## 2017-05-21 DIAGNOSIS — S39012A Strain of muscle, fascia and tendon of lower back, initial encounter: Secondary | ICD-10-CM

## 2017-05-21 DIAGNOSIS — M47816 Spondylosis without myelopathy or radiculopathy, lumbar region: Secondary | ICD-10-CM | POA: Diagnosis not present

## 2017-05-21 DIAGNOSIS — G8929 Other chronic pain: Secondary | ICD-10-CM

## 2017-05-21 MED ORDER — MELOXICAM 15 MG PO TABS: 15.0000 mg | ORAL_TABLET | Freq: Every day | ORAL | 2 refills | Status: DC

## 2017-05-21 NOTE — Progress Notes (Signed)
 .  Numeric Pain Rating Scale and Functional Assessment Average Pain 8 Pain Right Now 8 My pain is intermittent and sharp Pain is worse with: sitting and standing Pain improves with: medication   In the last MONTH (on 0-10 scale) has pain interfered with the following?  1. General activity like being  able to carry out your everyday physical activities such as walking, climbing stairs, carrying groceries, or moving a chair?  Rating(5)  2. Relation with others like being able to carry out your usual social activities and roles such as  activities at home, at work and in your community. Rating(4)  3. Enjoyment of life such that you have  been bothered by emotional problems such as feeling anxious, depressed or irritable?  Rating(4)

## 2017-05-27 ENCOUNTER — Encounter (INDEPENDENT_AMBULATORY_CARE_PROVIDER_SITE_OTHER): Payer: Self-pay | Admitting: Physical Medicine and Rehabilitation

## 2017-05-27 ENCOUNTER — Ambulatory Visit (INDEPENDENT_AMBULATORY_CARE_PROVIDER_SITE_OTHER): Payer: Medicare Other | Admitting: Physical Medicine and Rehabilitation

## 2017-05-27 ENCOUNTER — Ambulatory Visit (INDEPENDENT_AMBULATORY_CARE_PROVIDER_SITE_OTHER): Payer: Self-pay

## 2017-05-27 VITALS — BP 128/74 | HR 76

## 2017-05-27 DIAGNOSIS — M47816 Spondylosis without myelopathy or radiculopathy, lumbar region: Secondary | ICD-10-CM | POA: Diagnosis not present

## 2017-05-27 DIAGNOSIS — M7541 Impingement syndrome of right shoulder: Secondary | ICD-10-CM

## 2017-05-27 MED ORDER — METHYLPREDNISOLONE ACETATE 80 MG/ML IJ SUSP
80.0000 mg | Freq: Once | INTRAMUSCULAR | Status: AC
Start: 2017-05-27 — End: 2017-05-27
  Administered 2017-05-27: 80 mg

## 2017-05-27 NOTE — Progress Notes (Signed)
 .  Numeric Pain Rating Scale and Functional Assessment Average Pain 5   In the last MONTH (on 0-10 scale) has pain interfered with the following?  1. General activity like being  able to carry out your everyday physical activities such as walking, climbing stairs, carrying groceries, or moving a chair?  Rating(3)   +Driver, -BT, -Dye Allergies.  

## 2017-05-27 NOTE — Patient Instructions (Signed)

## 2017-05-28 NOTE — Progress Notes (Signed)
Brandy Delgado - 72 y.o. female MRN [id number]  Date of birth: 1945-04-16  Office Visit Note: Visit Date: 05/27/2017 PCP: Patient, No Pcp Per Referred by: No ref. provider found  Subjective: Chief Complaint  Patient presents with  . Lower Back - Pain  . Right Thigh - Pain   HPI: Mrs. Dorgan is a 72 year old female that comes in today for planned bilateral L4-5 facet joint blocks.  Please see our prior evaluation and management note for further details and justification.  She also brings up today continued shoulder pain.  She is followed by Dr. Sharol Given in our office for orthopedic care.  Prior subacromial injections helped but did not last very long.  I did agree to see her in a couple weeks and completed intra-articular glenohumeral joint injection.  Depending on the relief with that she will follow-up with Dr. Sharol Given.   ROS Otherwise per HPI.  Assessment & Plan: Visit Diagnoses:  1. Spondylosis without myelopathy or radiculopathy, lumbar region   2. Impingement syndrome of right shoulder     Plan: No additional findings.   Meds & Orders:  Meds ordered this encounter  Medications  . methylPREDNISolone acetate (DEPO-MEDROL) injection 80 mg    Orders Placed This Encounter  Procedures  . Facet Injection  . XR C-ARM NO REPORT    Follow-up: No follow-ups on file.   Procedures: No procedures performed  Lumbar Facet Joint Intra-Articular Injection(s) with Fluoroscopic Guidance  [name]      Date of Birth: 1945/05/08 MRN: [id number] PCP: Patient, No Pcp Per      Visit Date: 05/27/2017   Universal Protocol:    Date/Time: 05/27/2017  Consent Given By: the patient  Position: PRONE   Additional Comments: Vital signs were monitored before and after the procedure. Patient was prepped and draped in the usual sterile fashion. The correct patient, procedure, and site was verified.   Injection Procedure Details:  Procedure Site One Meds Administered:  Meds ordered  this encounter  Medications  . methylPREDNISolone acetate (DEPO-MEDROL) injection 80 mg     Laterality: Bilateral  Location/Site:  L4-L5  Needle size: 22 guage  Needle type: Spinal  Needle Placement: Articular  Findings:  -Comments: Excellent flow of contrast producing a partial arthrogram.  Procedure Details: The fluoroscope beam is vertically oriented in AP, and the inferior recess is visualized beneath the lower pole of the inferior apophyseal process, which represents the target point for needle insertion. When direct visualization is difficult the target point is located at the medial projection of the vertebral pedicle. The region overlying each aforementioned target is locally anesthetized with a 1 to 2 ml. volume of 1% Lidocaine without Epinephrine.   The spinal needle was inserted into each of the above mentioned facet joints using biplanar fluoroscopic guidance. A 0.25 to 0.5 ml. volume of Isovue-250 was injected and a partial facet joint arthrogram was obtained. A single spot film was obtained of the resulting arthrogram.    One to 1.25 ml of the steroid/anesthetic solution was then injected into each of the facet joints noted above.   Additional Comments:  The patient tolerated the procedure well Dressing: Band-Aid    Post-procedure details: Patient was observed during the procedure. Post-procedure instructions were reviewed.  Patient left the clinic in stable condition.    Clinical History: MRI LUMBAR SPINE WITHOUT CONTRAST  TECHNIQUE: Multiplanar, multisequence MR imaging was performed. No intravenous contrast was administered.  COMPARISON:  DG LUMBAR SPINE COMPLETE dated 02/11/2013  FINDINGS: Lumbar vertebral bodies and posterior elements are intact and aligned with maintenance of the lumbar lordosis. Inferred mid lumbar dextroscoliosis on the axial sequences. Moderate to severe L2-3 and L3-4 disc height loss, mild at L1-2 with decreased T2 signal  within all lumbar disc most consistent with mild to moderate desiccation. Mild to moderate acute on chronic discogenic endplate changes at T0-3, subacute to chronic at L3-4. Minimal acute discogenic endplate change at T4-6 . No suspicious STIR signal to suggest fracture.  Conus medullaris terminates at L1 and appears normal in morphology and signal characteristics. Cauda equina is unremarkable. Moderate to severe symmetric paraspinal muscle atrophy. Prevertebral soft tissues are nonsuspicious.  Level by level evaluation:  L1-2: 4 mm broad-based disc bulge asymmetric to the right may encroach upon the exited right L 1 nerve. Mild facet arthropathy and ligamentum flavum redundancy with trace facet effusions which are likely reactive. No canal stenosis. Mild right neural foraminal narrowing.  L2-3: 3 mm broad-based disc bulge asymmetric to the left. Mild facet arthropathy and ligamentum flavum redundancy with trace facet effusions which are likely reactive. No canal stenosis. Mild bilateral neural foraminal narrowing.  L3-4: 3 mm broad-based disc bulge, mild to moderate facet arthropathy and ligamentum flavum redundancy without canal stenosis. Mild right, moderate left neural foraminal narrowing.  L4-5: Minimal annular bulging with superimposed 1-2 mm right paracentral disc protrusion. Moderate facet arthropathy and ligamentum flavum redundancy with bilateral facet effusions, measuring up to 3 mm on the left. In addition, trace fluid signal within the interspinous process which may reflect Baastrup's disease. No canal stenosis though, the disc protrusion slightly effaces the right lateral recess which may affect the traversing right L5 nerve. Mild to moderate left neural foraminal narrowing.  L5-S1: No disc bulge. Moderate to severe bilateral facet arthropathy with trace right facet effusion which is likely reactive. No canal stenosis. Minimal right neural foraminal  narrowing.  IMPRESSION: Degenerative change of lumbar spine with mid lumbar dextroscoliosis as seen on prior lumbar radiographs.  No canal stenosis. Neural foraminal narrowing L2-3 through L5-S1: Moderate on the left at L3-4.  Multilevel facet disease with facet effusions which are likely reactive.  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   Electronically Signed   By: Elon Alas   On: 03/05/2013 00:57   She reports that she has never smoked. She does not have any smokeless tobacco history on file. No results for input(s): HGBA1C, LABURIC in the last 8760 hours.  Objective:  VS:  HT:    WT:   BMI:     BP:128/74  HR:76bpm  TEMP: ( )  RESP:  Physical Exam  Ortho Exam Imaging: Xr C-arm No Report  Result Date: 05/27/2017 Please see Notes or Procedures tab for imaging impression.   Past Medical/Family/Surgical/Social History: Medications & Allergies reviewed per EMR, new medications updated. Patient Active Problem List   Diagnosis Date Noted  . Impingement syndrome of right shoulder 09/25/2016  . Impingement syndrome of left shoulder 12/12/2015   Past Medical History:  Diagnosis Date  . Thyroid disease    History reviewed. No pertinent family history. History reviewed. No pertinent surgical history. Social History   Occupational History  . Not on file  Tobacco Use  . Smoking status: Never Smoker  Substance and Sexual Activity  . Alcohol use: No  . Drug use: No  . Sexual activity: Not on file

## 2017-05-28 NOTE — Procedures (Signed)
Lumbar Facet Joint Intra-Articular Injection(s) with Fluoroscopic Guidance  [name]      Date of Birth: 26-Sep-1945 MRN: [id number] PCP: Patient, No Pcp Per      Visit Date: 05/27/2017   Universal Protocol:    Date/Time: 05/27/2017  Consent Given By: the patient  Position: PRONE   Additional Comments: Vital signs were monitored before and after the procedure. Patient was prepped and draped in the usual sterile fashion. The correct patient, procedure, and site was verified.   Injection Procedure Details:  Procedure Site One Meds Administered:  Meds ordered this encounter  Medications  . methylPREDNISolone acetate (DEPO-MEDROL) injection 80 mg     Laterality: Bilateral  Location/Site:  L4-L5  Needle size: 22 guage  Needle type: Spinal  Needle Placement: Articular  Findings:  -Comments: Excellent flow of contrast producing a partial arthrogram.  Procedure Details: The fluoroscope beam is vertically oriented in AP, and the inferior recess is visualized beneath the lower pole of the inferior apophyseal process, which represents the target point for needle insertion. When direct visualization is difficult the target point is located at the medial projection of the vertebral pedicle. The region overlying each aforementioned target is locally anesthetized with a 1 to 2 ml. volume of 1% Lidocaine without Epinephrine.   The spinal needle was inserted into each of the above mentioned facet joints using biplanar fluoroscopic guidance. A 0.25 to 0.5 ml. volume of Isovue-250 was injected and a partial facet joint arthrogram was obtained. A single spot film was obtained of the resulting arthrogram.    One to 1.25 ml of the steroid/anesthetic solution was then injected into each of the facet joints noted above.   Additional Comments:  The patient tolerated the procedure well Dressing: Band-Aid    Post-procedure details: Patient was observed during the  procedure. Post-procedure instructions were reviewed.  Patient left the clinic in stable condition.

## 2017-06-04 ENCOUNTER — Encounter (INDEPENDENT_AMBULATORY_CARE_PROVIDER_SITE_OTHER): Payer: Self-pay | Admitting: Physical Medicine and Rehabilitation

## 2017-06-04 NOTE — Progress Notes (Signed)
Brandy Delgado - 72 y.o. female MRN [id number]  Date of birth: [birth year]  Office Visit Note: Visit Date: 05/21/2017 PCP: Patient, No Pcp Per Referred by: No ref. provider found  Subjective: Chief Complaint  Patient presents with  . Lower Back - Pain  . Right Leg - Pain, Numbness, Tingling   HPI: Brandy Delgado is a 72 year old female that I last saw in 2017 and completed facet joint blocks at L4-5 bilaterally with good relief of symptoms of her back pain.  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  She is followed by Brandy Delgado for her orthopedic complaints mainly of left and right shoulder impingement syndrome for which she had an injection a few months ago that is helped to some degree.  She is in physical therapy for her shoulders.  In terms of her low back pain she has some radiating symptoms to the right hip but mostly low back pain worse with standing and ambulating.  She gets some relief with sitting but can get pain with prolonged sitting.  She rates her average pain is an 8 out of 10.  She does get some improvement with medication which is basically a small amount of ibuprofen as needed.  She reports really significant worsening several weeks ago but chronic pain throughout.  She is been doing okay from an activity standpoint.  She has not noted any focal weakness or bowel or bladder issues.  She does not have any pain going down the legs although it does refer into the right hip at times.  In the past we have completed epidural injection for more radicular complaints but that was back in 2015.  MRI from 2015 does show facet arthropathy fairly severe at L4-5 and L5-S1 with foraminal protrusion in the upper lumbar region.  She has not had any bowel or bladder dysfunction or fevers chills or night sweats or other red flag complaints.  She is had no new trauma.  She has had therapy in the past for her back.   Review of Systems  Constitutional: Negative for chills, fever, malaise/fatigue and weight  loss.  HENT: Negative for hearing loss and sinus pain.   Eyes: Negative for blurred vision, double vision and photophobia.  Respiratory: Negative for cough and shortness of breath.   Cardiovascular: Negative for chest pain, palpitations and leg swelling.  Gastrointestinal: Negative for abdominal pain, nausea and vomiting.  Genitourinary: Negative for flank pain.  Musculoskeletal: Positive for back pain and joint pain. Negative for myalgias.  Skin: Negative for itching and rash.  Neurological: Negative for tremors, focal weakness and weakness.  Endo/Heme/Allergies: Negative.   Psychiatric/Behavioral: Negative for depression.  All other systems reviewed and are negative.  Otherwise per HPI.  Assessment & Plan: Visit Diagnoses:  1. Spondylosis without myelopathy or radiculopathy, lumbar region   2. Chronic bilateral low back pain without sciatica   3. Strain of lumbar region, initial encounter     Plan: Findings:  Chronic worsening severe mostly axial low back pain worse with standing and worse with facet joint loading on exam.  She does get some symptoms with prolonged sitting I think she probably has some still issue with the disc problems but her main problem is spondylosis.  She is still undergoing physical therapy for her shoulders.  I think the best approach is to give her meloxicam to take daily for a couple of weeks.  We will schedule her for facet joint blocks which is helped in the past.  If she is doing well at the time of that appointment then we could forego the injection.  She would be a good candidate for radiofrequency ablation at some point.  We have counseled her on the use of the meloxicam as well as activity modification and exercises.    Meds & Orders:  Meds ordered this encounter  Medications  . meloxicam (MOBIC) 15 MG tablet    Sig: Take 1 tablet (15 mg total) by mouth daily. Take with food    Dispense:  30 tablet    Refill:  2   No orders of the defined types were  placed in this encounter.   Follow-up: Return for Possible facet joint block.   Procedures: No procedures performed  No notes on file   Clinical History: MRI LUMBAR SPINE WITHOUT CONTRAST  TECHNIQUE: Multiplanar, multisequence MR imaging was performed. No intravenous contrast was administered.  COMPARISON:  DG LUMBAR SPINE COMPLETE dated 02/11/2013  FINDINGS: Lumbar vertebral bodies and posterior elements are intact and aligned with maintenance of the lumbar lordosis. Inferred mid lumbar dextroscoliosis on the axial sequences. Moderate to severe L2-3 and L3-4 disc height loss, mild at L1-2 with decreased T2 signal within all lumbar disc most consistent with mild to moderate desiccation. Mild to moderate acute on chronic discogenic endplate changes at I6-2, subacute to chronic at L3-4. Minimal acute discogenic endplate change at V0-3 . No suspicious STIR signal to suggest fracture.  Conus medullaris terminates at L1 and appears normal in morphology and signal characteristics. Cauda equina is unremarkable. Moderate to severe symmetric paraspinal muscle atrophy. Prevertebral soft tissues are nonsuspicious.  Level by level evaluation:  L1-2: 4 mm broad-based disc bulge asymmetric to the right may encroach upon the exited right L 1 nerve. Mild facet arthropathy and ligamentum flavum redundancy with trace facet effusions which are likely reactive. No canal stenosis. Mild right neural foraminal narrowing.  L2-3: 3 mm broad-based disc bulge asymmetric to the left. Mild facet arthropathy and ligamentum flavum redundancy with trace facet effusions which are likely reactive. No canal stenosis. Mild bilateral neural foraminal narrowing.  L3-4: 3 mm broad-based disc bulge, mild to moderate facet arthropathy and ligamentum flavum redundancy without canal stenosis. Mild right, moderate left neural foraminal narrowing.  L4-5: Minimal annular bulging with superimposed 1-2 mm  right paracentral disc protrusion. Moderate facet arthropathy and ligamentum flavum redundancy with bilateral facet effusions, measuring up to 3 mm on the left. In addition, trace fluid signal within the interspinous process which may reflect Baastrup's disease. No canal stenosis though, the disc protrusion slightly effaces the right lateral recess which may affect the traversing right L5 nerve. Mild to moderate left neural foraminal narrowing.  L5-S1: No disc bulge. Moderate to severe bilateral facet arthropathy with trace right facet effusion which is likely reactive. No canal stenosis. Minimal right neural foraminal narrowing.  IMPRESSION: Degenerative change of lumbar spine with mid lumbar dextroscoliosis as seen on prior lumbar radiographs.  No canal stenosis. Neural foraminal narrowing L2-3 through L5-S1: Moderate on the left at L3-4.  Multilevel facet disease with facet effusions which are likely reactive.  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   Electronically Signed   By: Elon Alas   On: 03/05/2013 00:57   She reports that she has never smoked. She does not have any smokeless tobacco history on file. No results for input(s): HGBA1C, LABURIC in the last 8760 hours.  Objective:  VS:  HT:    WT:   BMI:     BP:139/79  HR:69bpm  TEMP:98.4 F (36.9 C)(Oral)  RESP:100 % Physical Exam  Constitutional: She is oriented to person, place, and time. She appears well-developed and well-nourished. No distress.  HENT:  Head: Normocephalic and atraumatic.  Nose: Nose normal.  Mouth/Throat: Oropharynx is clear and moist.  Eyes: Pupils are equal, round, and reactive to light. Conjunctivae are normal.  Neck: Normal range of motion. Neck supple.  Cardiovascular: Regular rhythm and intact distal pulses.  Pulmonary/Chest: Effort normal. No respiratory distress.  Abdominal: She exhibits no distension. There is no guarding.  Musculoskeletal:  Patient is slow to rise  from a seated position does have pain which is concordant with her low back pain with extension rotation of the lumbar spine bilaterally.  She has no pain over the greater trochanters and no pain with hip rotation.  She has good distal strength without clonus.  Neurological: She is alert and oriented to person, place, and time. She exhibits normal muscle tone. Coordination normal.  Skin: Skin is warm. No rash noted. No erythema.  Psychiatric: She has a normal mood and affect. Her behavior is normal.  Nursing note and vitals reviewed.   Ortho Exam Imaging: No results found.  Past Medical/Family/Surgical/Social History: Medications & Allergies reviewed per EMR, new medications updated. Patient Active Problem List   Diagnosis Date Noted  . Impingement syndrome of right shoulder 09/25/2016  . Impingement syndrome of left shoulder 12/12/2015   Past Medical History:  Diagnosis Date  . Thyroid disease    History reviewed. No pertinent family history. History reviewed. No pertinent surgical history. Social History   Occupational History  . Not on file  Tobacco Use  . Smoking status: Never Smoker  Substance and Sexual Activity  . Alcohol use: No  . Drug use: No  . Sexual activity: Not on file

## 2017-06-17 ENCOUNTER — Encounter (INDEPENDENT_AMBULATORY_CARE_PROVIDER_SITE_OTHER): Payer: Self-pay | Admitting: Physical Medicine and Rehabilitation

## 2017-06-17 ENCOUNTER — Ambulatory Visit (INDEPENDENT_AMBULATORY_CARE_PROVIDER_SITE_OTHER): Payer: Medicare Other | Admitting: Physical Medicine and Rehabilitation

## 2017-06-17 ENCOUNTER — Ambulatory Visit (INDEPENDENT_AMBULATORY_CARE_PROVIDER_SITE_OTHER): Payer: Medicare Other

## 2017-06-17 DIAGNOSIS — M5416 Radiculopathy, lumbar region: Secondary | ICD-10-CM

## 2017-06-17 DIAGNOSIS — G8929 Other chronic pain: Secondary | ICD-10-CM | POA: Diagnosis not present

## 2017-06-17 DIAGNOSIS — M25551 Pain in right hip: Secondary | ICD-10-CM

## 2017-06-17 DIAGNOSIS — M25511 Pain in right shoulder: Secondary | ICD-10-CM | POA: Diagnosis not present

## 2017-06-17 NOTE — Progress Notes (Signed)
Brandy Delgado - 72 y.o. female MRN [id number]  Date of birth: 1945-10-10  Office Visit Note: Visit Date: 06/17/2017 PCP: Patient, No Pcp Per Referred by: No ref. provider found  Subjective: Chief Complaint  Patient presents with  . Right Shoulder - Pain  . Right Arm - Pain   HPI: Brandy Delgado is a 72 year old right-hand-dominant female with severe chronic right shoulder pain and decreased range of motion.  She is managed orthopedically by Dr. Sharol Given.  We have recently seen her for her lower back and she commented that her right shoulder was very problematic.  Prior subacromial injection evidently did not give her much relief so we are going to try diagnostic intra-articular injection today.  She will continue to follow-up with Dr. Sharol Given.   ROS Otherwise per HPI.  Assessment & Plan: Visit Diagnoses:  1. Chronic right shoulder pain   2. Pain in right hip   3. Lumbar radiculopathy     Plan: Findings:  Diagnostic note for therapeutic anesthetic arthrogram of the glenohumeral joint.  There was some extravasation of fluid that there was decreased symptoms with the anesthetic phase.  The patient did have increased range of motion but still had some limitations.    Meds & Orders: No orders of the defined types were placed in this encounter.   Orders Placed This Encounter  Procedures  . Large Joint Inj: R glenohumeral  . XR C-ARM NO REPORT    Follow-up: Return for Right L5-S1 interlaminar epidural.   Procedures: Large Joint Inj: R glenohumeral on 06/17/2017 11:06 AM Indications: pain and diagnostic evaluation Details: 22 G 3.5 in needle, anteromedial approach  Arthrogram: Yes  Medications: 3 mL bupivacaine 0.5 %; 80 mg triamcinolone acetonide 40 MG/ML  Arthrogram demonstrated excellent flow of contrast throughout the joint surface with mild extravasation suggesting possible RTC defect.  The patient had relief of symptoms during the anesthetic phase of the injection.  Procedure,  treatment alternatives, risks and benefits explained, specific risks discussed. Consent was given by the patient. Immediately prior to procedure a time out was called to verify the correct patient, procedure, equipment, support staff and site/side marked as required. Patient was prepped and draped in the usual sterile fashion.      No notes on file   Clinical History: MRI LUMBAR SPINE WITHOUT CONTRAST  TECHNIQUE: Multiplanar, multisequence MR imaging was performed. No intravenous contrast was administered.  COMPARISON:  DG LUMBAR SPINE COMPLETE dated 02/11/2013  FINDINGS: Lumbar vertebral bodies and posterior elements are intact and aligned with maintenance of the lumbar lordosis. Inferred mid lumbar dextroscoliosis on the axial sequences. Moderate to severe L2-3 and L3-4 disc height loss, mild at L1-2 with decreased T2 signal within all lumbar disc most consistent with mild to moderate desiccation. Mild to moderate acute on chronic discogenic endplate changes at W0-9, subacute to chronic at L3-4. Minimal acute discogenic endplate change at W1-1 . No suspicious STIR signal to suggest fracture.  Conus medullaris terminates at L1 and appears normal in morphology and signal characteristics. Cauda equina is unremarkable. Moderate to severe symmetric paraspinal muscle atrophy. Prevertebral soft tissues are nonsuspicious.  Level by level evaluation:  L1-2: 4 mm broad-based disc bulge asymmetric to the right may encroach upon the exited right L 1 nerve. Mild facet arthropathy and ligamentum flavum redundancy with trace facet effusions which are likely reactive. No canal stenosis. Mild right neural foraminal narrowing.  L2-3: 3 mm broad-based disc bulge asymmetric to the left. Mild facet arthropathy and ligamentum flavum  redundancy with trace facet effusions which are likely reactive. No canal stenosis. Mild bilateral neural foraminal narrowing.  L3-4: 3 mm broad-based disc  bulge, mild to moderate facet arthropathy and ligamentum flavum redundancy without canal stenosis. Mild right, moderate left neural foraminal narrowing.  L4-5: Minimal annular bulging with superimposed 1-2 mm right paracentral disc protrusion. Moderate facet arthropathy and ligamentum flavum redundancy with bilateral facet effusions, measuring up to 3 mm on the left. In addition, trace fluid signal within the interspinous process which may reflect Baastrup's disease. No canal stenosis though, the disc protrusion slightly effaces the right lateral recess which may affect the traversing right L5 nerve. Mild to moderate left neural foraminal narrowing.  L5-S1: No disc bulge. Moderate to severe bilateral facet arthropathy with trace right facet effusion which is likely reactive. No canal stenosis. Minimal right neural foraminal narrowing.  IMPRESSION: Degenerative change of lumbar spine with mid lumbar dextroscoliosis as seen on prior lumbar radiographs.  No canal stenosis. Neural foraminal narrowing L2-3 through L5-S1: Moderate on the left at L3-4.  Multilevel facet disease with facet effusions which are likely reactive.  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   Electronically Signed   By: Elon Alas   On: 03/05/2013 00:57   She reports that she has never smoked. She has never used smokeless tobacco. No results for input(s): HGBA1C, LABURIC in the last 8760 hours.  Objective:  VS:  HT:    WT:   BMI:     BP:   HR: bpm  TEMP: ( )  RESP:  Physical Exam  Ortho Exam Imaging: Xr C-arm No Report  Result Date: 06/17/2017 Please see Notes or Procedures tab for imaging impression.   Past Medical/Family/Surgical/Social History: Medications & Allergies reviewed per EMR, new medications updated. Patient Active Problem List   Diagnosis Date Noted  . Impingement syndrome of right shoulder 09/25/2016  . Impingement syndrome of left shoulder 12/12/2015   Past Medical  History:  Diagnosis Date  . Thyroid disease    History reviewed. No pertinent family history. History reviewed. No pertinent surgical history. Social History   Occupational History  . Not on file  Tobacco Use  . Smoking status: Never Smoker  . Smokeless tobacco: Never Used  Substance and Sexual Activity  . Alcohol use: No  . Drug use: No  . Sexual activity: Not on file

## 2017-06-17 NOTE — Patient Instructions (Signed)

## 2017-06-17 NOTE — Progress Notes (Signed)
 .  Numeric Pain Rating Scale and Functional Assessment Average Pain 4   In the last MONTH (on 0-10 scale) has pain interfered with the following?  1. General activity like being  able to carry out your everyday physical activities such as walking, climbing stairs, carrying groceries, or moving a chair?  Rating(5)    -Dye Allergies.  

## 2017-06-18 MED ORDER — TRIAMCINOLONE ACETONIDE 40 MG/ML IJ SUSP
80.0000 mg | INTRAMUSCULAR | Status: AC | PRN
Start: 2017-06-17 — End: 2017-06-17
  Administered 2017-06-17: 80 mg via INTRA_ARTICULAR

## 2017-06-18 MED ORDER — BUPIVACAINE HCL 0.5 % IJ SOLN
3.0000 mL | INTRAMUSCULAR | Status: AC | PRN
  Administered 2017-06-17: 3 mL via INTRA_ARTICULAR

## 2017-07-01 ENCOUNTER — Encounter (INDEPENDENT_AMBULATORY_CARE_PROVIDER_SITE_OTHER): Payer: Self-pay | Admitting: Physical Medicine and Rehabilitation

## 2017-07-01 ENCOUNTER — Ambulatory Visit (INDEPENDENT_AMBULATORY_CARE_PROVIDER_SITE_OTHER): Payer: Medicare Other | Admitting: Physical Medicine and Rehabilitation

## 2017-07-01 ENCOUNTER — Ambulatory Visit (INDEPENDENT_AMBULATORY_CARE_PROVIDER_SITE_OTHER): Payer: Medicare Other

## 2017-07-01 VITALS — BP 132/69 | HR 72

## 2017-07-01 DIAGNOSIS — M5416 Radiculopathy, lumbar region: Secondary | ICD-10-CM | POA: Diagnosis not present

## 2017-07-01 MED ORDER — METHYLPREDNISOLONE ACETATE 80 MG/ML IJ SUSP
80.0000 mg | Freq: Once | INTRAMUSCULAR | Status: AC
  Administered 2017-07-01: 80 mg

## 2017-07-01 NOTE — Progress Notes (Addendum)
 .  Numeric Pain Rating Scale and Functional Assessment Average Pain 6   In the last MONTH (on 0-10 scale) has pain interfered with the following?  1. General activity like being  able to carry out your everyday physical activities such as walking, climbing stairs, carrying groceries, or moving a chair?  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

## 2017-07-01 NOTE — Patient Instructions (Signed)

## 2017-07-02 NOTE — Progress Notes (Addendum)
Brandy Delgado - 72 y.o. female MRN [id number]  Date of birth: [birth year]  Office Visit Note: Visit Date: 07/01/2017 PCP: Patient, No Pcp Per Referred by: No ref. provider found  Subjective: Chief Complaint  Patient presents with  . Lower Back - Pain  . Right Leg - Pain   HPI: Brandy Delgado is a pleasant 72 year old female that was recently seen for lumbar facet joint block as well as shoulder injection.  Her shoulder is doing absolutely wonderful at this point.  The facet joint block is been noted before did not seem to help as much as it had in the past.  We are going to complete a diagnostic and hopefully therapeutic intralaminar epidural steroid injection.  Unfortunately today she did not have a driver.  She is going to wait in the waiting room for an hour and will assess her situation at that point.  In the future we really cannot make allowances for that and she understands.  We will see her back as needed.  Please see our prior evaluation and management note for further details and justification.   ROS Otherwise per HPI.  Assessment & Plan: Visit Diagnoses:  1. Lumbar radiculopathy     Plan: No additional findings.   Meds & Orders:  Meds ordered this encounter  Medications  . methylPREDNISolone acetate (DEPO-MEDROL) injection 80 mg    Orders Placed This Encounter  Procedures  . XR C-ARM NO REPORT  . Epidural Steroid injection    Follow-up: Return if symptoms worsen or fail to improve.   Procedures: No procedures performed  Lumbar Epidural Steroid Injection - Interlaminar Approach with Fluoroscopic Guidance  [name]      Date of Birth: 16-Aug-1945 MRN: [id number] PCP: Patient, No Pcp Per      Visit Date: 07/01/2017   Universal Protocol:     Consent Given By: the patient  Position: PRONE  Additional Comments: Vital signs were monitored before and after the procedure. Patient was prepped and draped in the usual sterile fashion. The correct patient,  procedure, and site was verified.   Injection Procedure Details:  Procedure Site One Meds Administered:  Meds ordered this encounter  Medications  . methylPREDNISolone acetate (DEPO-MEDROL) injection 80 mg     Laterality: Right  Location/Site:  L5-S1  Needle size: 20 G  Needle type: Tuohy  Needle Placement: Paramedian epidural  Findings:   -Comments: Excellent flow of contrast into the epidural space.  Procedure Details: Using a paramedian approach from the side mentioned above, the region overlying the inferior lamina was localized under fluoroscopic visualization and the soft tissues overlying this structure were infiltrated with 4 ml. of 1% Lidocaine without Epinephrine. The Tuohy needle was inserted into the epidural space using a paramedian approach.   The epidural space was localized using loss of resistance along with lateral and bi-planar fluoroscopic views.  After negative aspirate for air, blood, and CSF, a 2 ml. volume of Isovue-250 was injected into the epidural space and the flow of contrast was observed. Radiographs were obtained for documentation purposes.    The injectate was administered into the level noted above.   Additional Comments:  The patient tolerated the procedure well Dressing: Band-Aid    Post-procedure details: Patient was observed during the procedure. Post-procedure instructions were reviewed.  Patient left the clinic in stable condition.   Clinical History: MRI LUMBAR SPINE WITHOUT CONTRAST  TECHNIQUE: Multiplanar, multisequence MR imaging was performed. No intravenous contrast was administered.  COMPARISON:  DG LUMBAR SPINE COMPLETE dated 02/11/2013  FINDINGS: Lumbar vertebral bodies and posterior elements are intact and aligned with maintenance of the lumbar lordosis. Inferred mid lumbar dextroscoliosis on the axial sequences. Moderate to severe L2-3 and L3-4 disc height loss, mild at L1-2 with decreased T2 signal  within all lumbar disc most consistent with mild to moderate desiccation. Mild to moderate acute on chronic discogenic endplate changes at I6-9, subacute to chronic at L3-4. Minimal acute discogenic endplate change at G2-9 . No suspicious STIR signal to suggest fracture.  Conus medullaris terminates at L1 and appears normal in morphology and signal characteristics. Cauda equina is unremarkable. Moderate to severe symmetric paraspinal muscle atrophy. Prevertebral soft tissues are nonsuspicious.  Level by level evaluation:  L1-2: 4 mm broad-based disc bulge asymmetric to the right may encroach upon the exited right L 1 nerve. Mild facet arthropathy and ligamentum flavum redundancy with trace facet effusions which are likely reactive. No canal stenosis. Mild right neural foraminal narrowing.  L2-3: 3 mm broad-based disc bulge asymmetric to the left. Mild facet arthropathy and ligamentum flavum redundancy with trace facet effusions which are likely reactive. No canal stenosis. Mild bilateral neural foraminal narrowing.  L3-4: 3 mm broad-based disc bulge, mild to moderate facet arthropathy and ligamentum flavum redundancy without canal stenosis. Mild right, moderate left neural foraminal narrowing.  L4-5: Minimal annular bulging with superimposed 1-2 mm right paracentral disc protrusion. Moderate facet arthropathy and ligamentum flavum redundancy with bilateral facet effusions, measuring up to 3 mm on the left. In addition, trace fluid signal within the interspinous process which may reflect Baastrup's disease. No canal stenosis though, the disc protrusion slightly effaces the right lateral recess which may affect the traversing right L5 nerve. Mild to moderate left neural foraminal narrowing.  L5-S1: No disc bulge. Moderate to severe bilateral facet arthropathy with trace right facet effusion which is likely reactive. No canal stenosis. Minimal right neural foraminal  narrowing.  IMPRESSION: Degenerative change of lumbar spine with mid lumbar dextroscoliosis as seen on prior lumbar radiographs.  No canal stenosis. Neural foraminal narrowing L2-3 through L5-S1: Moderate on the left at L3-4.  Multilevel facet disease with facet effusions which are likely reactive.  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   Electronically Signed   By: Elon Alas   On: 03/05/2013 00:57   She reports that she has never smoked. She has never used smokeless tobacco. No results for input(s): HGBA1C, LABURIC in the last 8760 hours.  Objective:  VS:  HT:    WT:   BMI:     BP:132/69  HR:72bpm  TEMP: ( )  RESP:  Physical Exam  Ortho Exam Imaging: Xr C-arm No Report  Result Date: 07/01/2017 Please see Notes tab for imaging impression.   Past Medical/Family/Surgical/Social History: Medications & Allergies reviewed per EMR, new medications updated. Patient Active Problem List   Diagnosis Date Noted  . Impingement syndrome of right shoulder 09/25/2016  . Impingement syndrome of left shoulder 12/12/2015   Past Medical History:  Diagnosis Date  . Thyroid disease    History reviewed. No pertinent family history. History reviewed. No pertinent surgical history. Social History   Occupational History  . Not on file  Tobacco Use  . Smoking status: Never Smoker  . Smokeless tobacco: Never Used  Substance and Sexual Activity  . Alcohol use: No  . Drug use: No  . Sexual activity: Not on file

## 2017-07-02 NOTE — Procedures (Signed)
Lumbar Epidural Steroid Injection - Interlaminar Approach with Fluoroscopic Guidance  [name]      Date of Birth: 07-27-1945 MRN: [id number] PCP: Patient, No Pcp Per      Visit Date: 07/01/2017   Universal Protocol:     Consent Given By: the patient  Position: PRONE  Additional Comments: Vital signs were monitored before and after the procedure. Patient was prepped and draped in the usual sterile fashion. The correct patient, procedure, and site was verified.   Injection Procedure Details:  Procedure Site One Meds Administered:  Meds ordered this encounter  Medications  . methylPREDNISolone acetate (DEPO-MEDROL) injection 80 mg     Laterality: Right  Location/Site:  L5-S1  Needle size: 20 G  Needle type: Tuohy  Needle Placement: Paramedian epidural  Findings:   -Comments: Excellent flow of contrast into the epidural space.  Procedure Details: Using a paramedian approach from the side mentioned above, the region overlying the inferior lamina was localized under fluoroscopic visualization and the soft tissues overlying this structure were infiltrated with 4 ml. of 1% Lidocaine without Epinephrine. The Tuohy needle was inserted into the epidural space using a paramedian approach.   The epidural space was localized using loss of resistance along with lateral and bi-planar fluoroscopic views.  After negative aspirate for air, blood, and CSF, a 2 ml. volume of Isovue-250 was injected into the epidural space and the flow of contrast was observed. Radiographs were obtained for documentation purposes.    The injectate was administered into the level noted above.   Additional Comments:  The patient tolerated the procedure well Dressing: Band-Aid    Post-procedure details: Patient was observed during the procedure. Post-procedure instructions were reviewed.  Patient left the clinic in stable condition.

## 2018-03-10 ENCOUNTER — Ambulatory Visit (INDEPENDENT_AMBULATORY_CARE_PROVIDER_SITE_OTHER): Payer: Medicare Other | Admitting: Orthopedic Surgery

## 2018-03-10 ENCOUNTER — Encounter (INDEPENDENT_AMBULATORY_CARE_PROVIDER_SITE_OTHER): Payer: Self-pay | Admitting: Orthopedic Surgery

## 2018-03-10 VITALS — Ht 65.0 in | Wt 170.0 lb

## 2018-03-10 DIAGNOSIS — M7541 Impingement syndrome of right shoulder: Secondary | ICD-10-CM

## 2018-03-10 MED ORDER — METHYLPREDNISOLONE ACETATE 40 MG/ML IJ SUSP
40.0000 mg | INTRAMUSCULAR | Status: AC | PRN
  Administered 2018-03-10: 40 mg via INTRA_ARTICULAR

## 2018-03-10 MED ORDER — LIDOCAINE HCL 1 % IJ SOLN
5.0000 mL | INTRAMUSCULAR | Status: AC | PRN
  Administered 2018-03-10: 5 mL

## 2018-03-10 NOTE — Progress Notes (Signed)
   Office Visit Note   [name]           Date of Birth: Jan 23, 1945           MRN: [id number] Visit Date: 03/10/2018              Requested by: No referring provider defined for this encounter. PCP: Patient, No Pcp Per  Chief Complaint  Patient presents with  . Right Shoulder - Pain      HPI: Patient is a 73 year old woman who presents with recurrent impingement symptoms of the right shoulder she states she cannot lift her arm above 90 degrees she cannot touch her right hand to the left shoulder.  She states the symptoms have been going on for about a month she complains of pain anteriorly over the deltoid and biceps.  Assessment & Plan: Visit Diagnoses:  1. Impingement syndrome of right shoulder     Plan: Subacromial injection was provided for the right shoulder.  Follow-up as needed.  Discussed that with her arthritic changes of her shoulder she may need an intra-articular injection.  Follow-Up Instructions: Return if symptoms worsen or fail to improve.   Ortho Exam  Patient is alert, oriented, no adenopathy, well-dressed, normal affect, normal respiratory effort. Examination patient has abduction flexion to 70 degrees she has pain with Neer and Hawkins impingement test pain with a drop arm test she is tender to palpation anteriorly over the biceps.  Imaging: No results found. No images are attached to the encounter.  Labs: Lab Results  Component Value Date   ESRSEDRATE 20 02/11/2013     No results found for: ALBUMIN, PREALBUMIN, LABURIC  Body mass index is 28.29 kg/m.  Orders:  No orders of the defined types were placed in this encounter.  No orders of the defined types were placed in this encounter.    Procedures: Large Joint Inj: R subacromial bursa on 03/10/2018 8:43 AM Indications: diagnostic evaluation and pain Details: 22 G 1.5 in needle, posterior approach  Arthrogram: No  Medications: 5 mL lidocaine 1 %; 40 mg methylPREDNISolone  acetate 40 MG/ML Outcome: tolerated well, no immediate complications Procedure, treatment alternatives, risks and benefits explained, specific risks discussed. Consent was given by the patient. Immediately prior to procedure a time out was called to verify the correct patient, procedure, equipment, support staff and site/side marked as required. Patient was prepped and draped in the usual sterile fashion.      Clinical Data: No additional findings.  ROS:  All other systems negative, except as noted in the HPI. Review of Systems  Objective: Vital Signs: Ht 5\' 5"  (1.651 m)   Wt 170 lb (77.1 kg)   BMI 28.29 kg/m   Specialty Comments:  No specialty comments available.  PMFS History: Patient Active Problem List   Diagnosis Date Noted  . Impingement syndrome of right shoulder 09/25/2016  . Impingement syndrome of left shoulder 12/12/2015   Past Medical History:  Diagnosis Date  . Thyroid disease     History reviewed. No pertinent family history.  History reviewed. No pertinent surgical history. Social History   Occupational History  . Not on file  Tobacco Use  . Smoking status: Never Smoker  . Smokeless tobacco: Never Used  Substance and Sexual Activity  . Alcohol use: No  . Drug use: No  . Sexual activity: Not on file

## 2018-07-07 ENCOUNTER — Encounter: Payer: Self-pay | Admitting: Orthopedic Surgery

## 2018-07-07 ENCOUNTER — Ambulatory Visit (INDEPENDENT_AMBULATORY_CARE_PROVIDER_SITE_OTHER): Payer: Medicare Other | Admitting: Physician Assistant

## 2018-07-07 ENCOUNTER — Other Ambulatory Visit: Payer: Self-pay

## 2018-07-07 VITALS — Ht 65.0 in | Wt 170.0 lb

## 2018-07-07 DIAGNOSIS — M7541 Impingement syndrome of right shoulder: Secondary | ICD-10-CM | POA: Diagnosis not present

## 2018-07-07 MED ORDER — LIDOCAINE HCL 1 % IJ SOLN
5.0000 mL | INTRAMUSCULAR | Status: AC | PRN
  Administered 2018-07-07: 5 mL

## 2018-07-07 MED ORDER — METHYLPREDNISOLONE ACETATE 40 MG/ML IJ SUSP
40.0000 mg | INTRAMUSCULAR | Status: AC | PRN
  Administered 2018-07-07: 40 mg via INTRA_ARTICULAR

## 2018-07-07 NOTE — Progress Notes (Signed)
Office Visit Note   [name]           Date of Birth: [birth year]           MRN: [id number] Visit Date: 07/07/2018              Requested by: No referring provider defined for this encounter. PCP: Patient, No Pcp Per  Chief Complaint  Patient presents with  . Right Shoulder - Pain, Follow-up      HPI: The patient is a 73 yo woman who is seen for follow up of her right shoulder impingement syndrome. She reports the steroid injection in February of this year helped a little, but has not lasted very long. She continues a home exercise program after completing a course of outpatient PT, but reports she is having more and more difficulty moving the arm especially over her head to reach for something or behind her back to put on clothes. She reports pain over the top of the shoulder as well as pain into the upper arm. She has been taking Advil without relief.   Assessment & Plan: Visit Diagnoses:  1. Impingement syndrome of right shoulder     Plan: After informed consent, the right subacromial bursa area was injected with a combination of lidocaine and Depo medrol under sterile techniques and the patient tolerated this well. She reports some almost immediate improvement in her pain level. We also discussed trying Voltaren gel to the area up to 4 times daily.  She is going to continue her home exercise program and follow up in several weeks prn for continued symptoms.   Follow-Up Instructions: Return in about 4 weeks (around 08/04/2018), or if symptoms worsen or fail to improve.   Ortho Exam  Patient is alert, oriented, no adenopathy, well-dressed, normal affect, normal respiratory effort. Right shoulder is tender to palpation over the [facility] joint and over the distal biceps tendon insertion. Very limited forward flexion only to about 50-60 degrees and pain with internal and external rotation in ABDuction. Neurovascular intact distally with good strength distally.   Imaging:  No results found. No images are attached to the encounter.  Labs: Lab Results  Component Value Date   ESRSEDRATE 20 02/11/2013     No results found for: ALBUMIN, PREALBUMIN, LABURIC  Body mass index is 28.29 kg/m.  Orders:  Orders Placed This Encounter  Procedures  . Large Joint Inj   No orders of the defined types were placed in this encounter.    Procedures: Large Joint Inj: R subacromial bursa on 07/07/2018 8:22 AM Indications: diagnostic evaluation and pain Details: 22 G 1.5 in needle, posterior approach  Arthrogram: No  Medications: 5 mL lidocaine 1 %; 40 mg methylPREDNISolone acetate 40 MG/ML Outcome: tolerated well, no immediate complications Procedure, treatment alternatives, risks and benefits explained, specific risks discussed. Consent was given by the patient. Immediately prior to procedure a time out was called to verify the correct patient, procedure, equipment, support staff and site/side marked as required. Patient was prepped and draped in the usual sterile fashion.      Clinical Data: No additional findings.  ROS:  All other systems negative, except as noted in the HPI. Review of Systems  Objective: Vital Signs: Ht 5\' 5"  (1.651 m)   Wt 170 lb (77.1 kg)   BMI 28.29 kg/m   Specialty Comments:  No specialty comments available.  PMFS History: Patient Active Problem List   Diagnosis Date Noted  . Impingement syndrome of  right shoulder 09/25/2016  . Impingement syndrome of left shoulder 12/12/2015   Past Medical History:  Diagnosis Date  . Thyroid disease     History reviewed. No pertinent family history.  History reviewed. No pertinent surgical history. Social History   Occupational History  . Not on file  Tobacco Use  . Smoking status: Never Smoker  . Smokeless tobacco: Never Used  Substance and Sexual Activity  . Alcohol use: No  . Drug use: No  . Sexual activity: Not on file

## 2018-08-22 ENCOUNTER — Telehealth: Payer: Self-pay | Admitting: *Deleted

## 2018-08-25 NOTE — Telephone Encounter (Signed)
Ok, she is a patient I see infrequently, if nothing new etc then ok

## 2018-08-26 NOTE — Telephone Encounter (Signed)
Pt is scheduled for 09/09/2018 with driver and no BT

## 2018-09-09 ENCOUNTER — Encounter: Payer: Medicare Other | Admitting: Physical Medicine and Rehabilitation

## 2018-11-10 ENCOUNTER — Encounter (INDEPENDENT_AMBULATORY_CARE_PROVIDER_SITE_OTHER): Payer: Self-pay

## 2019-01-05 ENCOUNTER — Ambulatory Visit (INDEPENDENT_AMBULATORY_CARE_PROVIDER_SITE_OTHER): Payer: Medicare Other | Admitting: Physical Medicine and Rehabilitation

## 2019-01-05 ENCOUNTER — Telehealth: Payer: Self-pay | Admitting: *Deleted

## 2019-01-05 ENCOUNTER — Ambulatory Visit: Payer: Self-pay

## 2019-01-05 ENCOUNTER — Other Ambulatory Visit: Payer: Self-pay

## 2019-01-05 VITALS — BP 159/82 | HR 76

## 2019-01-05 DIAGNOSIS — M5416 Radiculopathy, lumbar region: Secondary | ICD-10-CM | POA: Diagnosis not present

## 2019-01-05 MED ORDER — MELOXICAM 15 MG PO TABS: 15.0000 mg | ORAL_TABLET | Freq: Every day | ORAL | 2 refills | Status: DC

## 2019-01-05 MED ORDER — METHYLPREDNISOLONE ACETATE 80 MG/ML IJ SUSP: 40.0000 mg | Freq: Once | INTRAMUSCULAR | Status: DC

## 2019-01-05 NOTE — Progress Notes (Signed)
Pt states pain in lower back mostly on the right side and radiates into the right thigh. Pt states pain started in August 2020. Pt states standing in one spot makes pain worse. advil and meloxicam helps with pain.   .Numeric Pain Rating Scale and Functional Assessment Average Pain 5   In the last MONTH (on 0-10 scale) has pain interfered with the following?  1. General activity like being  able to carry out your everyday physical activities such as walking, climbing stairs, carrying groceries, or moving a chair?  Rating(6)   +Driver, -BT, -Dye Allergies.

## 2019-01-05 NOTE — Telephone Encounter (Signed)
Done

## 2019-01-05 NOTE — Progress Notes (Signed)
Brandy Delgado - 73 y.o. female MRN [id number]  Date of birth: 07-21-1945  Office Visit Note: Visit Date: 01/05/2019 PCP: Patient, No Pcp Per Referred by: No ref. provider found  Subjective: Chief Complaint  Patient presents with  . Lower Back - Pain  . Right Thigh - Pain   HPI: Brandy Delgado is a 73 y.o. female who comes in today for planned Right L5-S1 lumbar epidural steroid injection with fluoroscopic guidance.  The patient has failed conservative care including home exercise, medications, time and activity modification.  This injection will be diagnostic and hopefully therapeutic.  Please see requesting physician notes for further details and justification.   ROS Otherwise per HPI.  Assessment & Plan: Visit Diagnoses:  1. Lumbar radiculopathy     Plan: No additional findings.   Meds & Orders:  Meds ordered this encounter  Medications  . methylPREDNISolone acetate (DEPO-MEDROL) injection 40 mg  . meloxicam (MOBIC) 15 MG tablet    Sig: Take 1 tablet (15 mg total) by mouth daily. Take with food    Dispense:  30 tablet    Refill:  2    Orders Placed This Encounter  Procedures  . XR C-ARM NO REPORT  . Epidural Steroid injection    Follow-up: Return for visit to requesting physician as needed.   Procedures: No procedures performed  Lumbar Epidural Steroid Injection - Interlaminar Approach with Fluoroscopic Guidance  [name]      Date of Birth: Oct 16, 1945 MRN: [id number] PCP: Patient, No Pcp Per      Visit Date: 01/05/2019   Universal Protocol:     Consent Given By: the patient  Position: PRONE  Additional Comments: Vital signs were monitored before and after the procedure. Patient was prepped and draped in the usual sterile fashion. The correct patient, procedure, and site was verified.   Injection Procedure Details:  Procedure Site One Meds Administered:  Meds ordered this encounter  Medications  . methylPREDNISolone acetate  (DEPO-MEDROL) injection 40 mg  . meloxicam (MOBIC) 15 MG tablet    Sig: Take 1 tablet (15 mg total) by mouth daily. Take with food    Dispense:  30 tablet    Refill:  2     Laterality: Right  Location/Site:  L5-S1  Needle size: 20 G  Needle type: Tuohy  Needle Placement: Paramedian epidural  Findings:   -Comments: Excellent flow of contrast into the epidural space.  Procedure Details: Using a paramedian approach from the side mentioned above, the region overlying the inferior lamina was localized under fluoroscopic visualization and the soft tissues overlying this structure were infiltrated with 4 ml. of 1% Lidocaine without Epinephrine. The Tuohy needle was inserted into the epidural space using a paramedian approach.   The epidural space was localized using loss of resistance along with lateral and bi-planar fluoroscopic views.  After negative aspirate for air, blood, and CSF, a 2 ml. volume of Isovue-250 was injected into the epidural space and the flow of contrast was observed. Radiographs were obtained for documentation purposes.    The injectate was administered into the level noted above.   Additional Comments:  The patient tolerated the procedure well Dressing: 2 x 2 sterile gauze and Band-Aid    Post-procedure details: Patient was observed during the procedure. Post-procedure instructions were reviewed.  Patient left the clinic in stable condition.     Clinical History: MRI LUMBAR SPINE WITHOUT CONTRAST  TECHNIQUE: Multiplanar, multisequence MR imaging was performed. No intravenous contrast was  administered.  COMPARISON:  DG LUMBAR SPINE COMPLETE dated 02/11/2013  FINDINGS: Lumbar vertebral bodies and posterior elements are intact and aligned with maintenance of the lumbar lordosis. Inferred mid lumbar dextroscoliosis on the axial sequences. Moderate to severe L2-3 and L3-4 disc height loss, mild at L1-2 with decreased T2 signal within all lumbar  disc most consistent with mild to moderate desiccation. Mild to moderate acute on chronic discogenic endplate changes at X33443, subacute to chronic at L3-4. Minimal acute discogenic endplate change at X33443 . No suspicious STIR signal to suggest fracture.  Conus medullaris terminates at L1 and appears normal in morphology and signal characteristics. Cauda equina is unremarkable. Moderate to severe symmetric paraspinal muscle atrophy. Prevertebral soft tissues are nonsuspicious.  Level by level evaluation:  L1-2: 4 mm broad-based disc bulge asymmetric to the right may encroach upon the exited right L 1 nerve. Mild facet arthropathy and ligamentum flavum redundancy with trace facet effusions which are likely reactive. No canal stenosis. Mild right neural foraminal narrowing.  L2-3: 3 mm broad-based disc bulge asymmetric to the left. Mild facet arthropathy and ligamentum flavum redundancy with trace facet effusions which are likely reactive. No canal stenosis. Mild bilateral neural foraminal narrowing.  L3-4: 3 mm broad-based disc bulge, mild to moderate facet arthropathy and ligamentum flavum redundancy without canal stenosis. Mild right, moderate left neural foraminal narrowing.  L4-5: Minimal annular bulging with superimposed 1-2 mm right paracentral disc protrusion. Moderate facet arthropathy and ligamentum flavum redundancy with bilateral facet effusions, measuring up to 3 mm on the left. In addition, trace fluid signal within the interspinous process which may reflect Baastrup's disease. No canal stenosis though, the disc protrusion slightly effaces the right lateral recess which may affect the traversing right L5 nerve. Mild to moderate left neural foraminal narrowing.  L5-S1: No disc bulge. Moderate to severe bilateral facet arthropathy with trace right facet effusion which is likely reactive. No canal stenosis. Minimal right neural foraminal  narrowing.  IMPRESSION: Degenerative change of lumbar spine with mid lumbar dextroscoliosis as seen on prior lumbar radiographs.  No canal stenosis. Neural foraminal narrowing L2-3 through L5-S1: Moderate on the left at L3-4.  Multilevel facet disease with facet effusions which are likely reactive.  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   Electronically Signed   By: Elon Alas   On: 03/05/2013 00:57   She reports that she has never smoked. She has never used smokeless tobacco. No results for input(s): HGBA1C, LABURIC in the last 8760 hours.  Objective:  VS:  HT:    WT:   BMI:     BP:(!) 159/82  HR:76bpm  TEMP: ( )  RESP:  Physical Exam Constitutional:      General: She is not in acute distress.    Appearance: Normal appearance. She is not ill-appearing.  HENT:     Head: Normocephalic and atraumatic.     Right Ear: External ear normal.     Left Ear: External ear normal.  Eyes:     Extraocular Movements: Extraocular movements intact.  Cardiovascular:     Rate and Rhythm: Normal rate.     Pulses: Normal pulses.  Musculoskeletal:     Right lower leg: No edema.     Left lower leg: No edema.     Comments: Patient has good distal strength with no pain over the greater trochanters.  No clonus or focal weakness.  Skin:    Findings: No erythema, lesion or rash.  Neurological:     General: No focal deficit  present.     Mental Status: She is alert and oriented to person, place, and time.     Sensory: No sensory deficit.     Motor: No weakness or abnormal muscle tone.     Coordination: Coordination normal.  Psychiatric:        Mood and Affect: Mood normal.        Behavior: Behavior normal.     Ortho Exam Imaging: No results found.  Past Medical/Family/Surgical/Social History: Medications & Allergies reviewed per EMR, new medications updated. Patient Active Problem List   Diagnosis Date Noted  . Impingement syndrome of right shoulder 09/25/2016  .  Impingement syndrome of left shoulder 12/12/2015   Past Medical History:  Diagnosis Date  . Thyroid disease    No family history on file. No past surgical history on file. Social History   Occupational History  . Not on file  Tobacco Use  . Smoking status: Never Smoker  . Smokeless tobacco: Never Used  Substance and Sexual Activity  . Alcohol use: No  . Drug use: No  . Sexual activity: Not on file

## 2019-06-30 NOTE — Procedures (Signed)
Lumbar Epidural Steroid Injection - Interlaminar Approach with Fluoroscopic Guidance  [name]      Date of Birth: 1945-08-02 MRN: [id number] PCP: Patient, No Pcp Per      Visit Date: 01/05/2019   Universal Protocol:     Consent Given By: the patient  Position: PRONE  Additional Comments: Vital signs were monitored before and after the procedure. Patient was prepped and draped in the usual sterile fashion. The correct patient, procedure, and site was verified.   Injection Procedure Details:  Procedure Site One Meds Administered:  Meds ordered this encounter  Medications  . methylPREDNISolone acetate (DEPO-MEDROL) injection 40 mg  . meloxicam (MOBIC) 15 MG tablet    Sig: Take 1 tablet (15 mg total) by mouth daily. Take with food    Dispense:  30 tablet    Refill:  2     Laterality: Right  Location/Site:  L5-S1  Needle size: 20 G  Needle type: Tuohy  Needle Placement: Paramedian epidural  Findings:   -Comments: Excellent flow of contrast into the epidural space.  Procedure Details: Using a paramedian approach from the side mentioned above, the region overlying the inferior lamina was localized under fluoroscopic visualization and the soft tissues overlying this structure were infiltrated with 4 ml. of 1% Lidocaine without Epinephrine. The Tuohy needle was inserted into the epidural space using a paramedian approach.   The epidural space was localized using loss of resistance along with lateral and bi-planar fluoroscopic views.  After negative aspirate for air, blood, and CSF, a 2 ml. volume of Isovue-250 was injected into the epidural space and the flow of contrast was observed. Radiographs were obtained for documentation purposes.    The injectate was administered into the level noted above.   Additional Comments:  The patient tolerated the procedure well Dressing: 2 x 2 sterile gauze and Band-Aid    Post-procedure details: Patient was observed  during the procedure. Post-procedure instructions were reviewed.  Patient left the clinic in stable condition.

## 2019-07-22 ENCOUNTER — Telehealth: Payer: Self-pay | Admitting: Physical Medicine and Rehabilitation

## 2019-07-22 NOTE — Telephone Encounter (Signed)
Pt would like a CB in regards to a prescription.    [phone]

## 2019-07-22 NOTE — Telephone Encounter (Signed)
Please Advise

## 2019-07-22 NOTE — Telephone Encounter (Signed)
Called pt she will like to have an refill on Meloxicam, pharmacy updated. Please Advise.

## 2019-07-24 ENCOUNTER — Other Ambulatory Visit: Payer: Self-pay | Admitting: Physical Medicine and Rehabilitation

## 2019-07-24 MED ORDER — MELOXICAM 15 MG PO TABS: 15.0000 mg | ORAL_TABLET | Freq: Every day | ORAL | 2 refills | Status: DC

## 2019-07-24 NOTE — Telephone Encounter (Signed)
done

## 2019-07-27 ENCOUNTER — Ambulatory Visit: Payer: Self-pay

## 2019-07-27 ENCOUNTER — Ambulatory Visit (INDEPENDENT_AMBULATORY_CARE_PROVIDER_SITE_OTHER): Payer: Medicare Other | Admitting: Orthopedic Surgery

## 2019-07-27 ENCOUNTER — Encounter: Payer: Self-pay | Admitting: Orthopedic Surgery

## 2019-07-27 ENCOUNTER — Other Ambulatory Visit: Payer: Self-pay

## 2019-07-27 DIAGNOSIS — M79672 Pain in left foot: Secondary | ICD-10-CM | POA: Diagnosis not present

## 2019-07-28 ENCOUNTER — Encounter: Payer: Self-pay | Admitting: Orthopedic Surgery

## 2019-07-28 NOTE — Progress Notes (Signed)
° °  Office Visit Note   [name]           Date of Birth: 1945/07/12           MRN: [id number] Visit Date: 07/27/2019              Requested by: No referring provider defined for this encounter. PCP: Patient, No Pcp Per  Chief Complaint  Patient presents with   Left Foot - Pain, Edema      HPI: Patient is a 74 year old woman who complains of left foot pain she states that she struck her foot about 3 weeks ago she has swelling and bruising with numbness burning and tingling over the dorsum of the foot.  Assessment & Plan: Visit Diagnoses:  1. Pain in left foot     Plan: Recommended knee-high compression stockings for the venous swelling her calf is 46 cm in circumference.  Recommended a stiff soled hiking sneaker to unload pressure across the midfoot recommended Achilles stretching.  Follow-Up Instructions: Return if symptoms worsen or fail to improve.   Ortho Exam  Patient is alert, oriented, no adenopathy, well-dressed, normal affect, normal respiratory effort. Patient has a palpable dorsalis pedis pulse she has venous stasis swelling with pitting edema of the left lower extremity with brawny skin color changes no open wounds no drainage the calf is 46 cm in circumference.  Left foot she has Achilles tightness she has tenderness to palpation across the midfoot consistent with the arthritic changes across the navicular cuneiform joints.  Imaging: No results found. No images are attached to the encounter.  Labs: Lab Results  Component Value Date   ESRSEDRATE 20 02/11/2013     No results found for: ALBUMIN, PREALBUMIN, LABURIC  No results found for: MG No results found for: VD25OH  No results found for: PREALBUMIN CBC EXTENDED Latest Ref Rng & Units 02/11/2013  WBC 4.0 - 10.5 K/uL 13.1(H)  RBC 3.87 - 5.11 MIL/uL 4.80  HGB 12.0 - 15.0 g/dL 14.9  HCT 36 - 46 % 43.1  PLT 150 - 400 K/uL 281     There is no height or weight on file to calculate  BMI.  Orders:  Orders Placed This Encounter  Procedures   XR Foot Complete Left   No orders of the defined types were placed in this encounter.    Procedures: No procedures performed  Clinical Data: No additional findings.  ROS:  All other systems negative, except as noted in the HPI. Review of Systems  Objective: Vital Signs: There were no vitals taken for this visit.  Specialty Comments:  No specialty comments available.  PMFS History: Patient Active Problem List   Diagnosis Date Noted   Impingement syndrome of right shoulder 09/25/2016   Impingement syndrome of left shoulder 12/12/2015   Past Medical History:  Diagnosis Date   Thyroid disease     History reviewed. No pertinent family history.  History reviewed. No pertinent surgical history. Social History   Occupational History   Not on file  Tobacco Use   Smoking status: Never Smoker   Smokeless tobacco: Never Used  Substance and Sexual Activity   Alcohol use: No   Drug use: No   Sexual activity: Not on file

## 2019-08-07 ENCOUNTER — Telehealth: Payer: Self-pay | Admitting: Physical Medicine and Rehabilitation

## 2019-08-07 NOTE — Telephone Encounter (Signed)
I've been wanting to repeat MRI but if last worked and she is hurting a lot then ok to repeat and maybe set up MRI for 1 month from now?

## 2019-08-07 NOTE — Telephone Encounter (Signed)
Patient called needing an appointment with Dr. Ernestina Patches. The number to contact patient is [phone]

## 2019-08-07 NOTE — Telephone Encounter (Signed)
Patient returned your call.

## 2019-08-07 NOTE — Telephone Encounter (Signed)
Left message #1

## 2019-08-07 NOTE — Telephone Encounter (Signed)
Right L5-S1 IL 01/05/19. Ok to repeat if helped, same problem/side, and no new injury?

## 2019-08-10 NOTE — Telephone Encounter (Signed)
Pt was sch for 08/31/19 for repeat Right L5-S1 IL.

## 2019-08-31 ENCOUNTER — Ambulatory Visit (INDEPENDENT_AMBULATORY_CARE_PROVIDER_SITE_OTHER): Payer: Medicare Other | Admitting: Physical Medicine and Rehabilitation

## 2019-08-31 ENCOUNTER — Encounter: Payer: Self-pay | Admitting: Physical Medicine and Rehabilitation

## 2019-08-31 ENCOUNTER — Ambulatory Visit: Payer: Self-pay

## 2019-08-31 ENCOUNTER — Other Ambulatory Visit: Payer: Self-pay

## 2019-08-31 VITALS — BP 175/80 | HR 68

## 2019-08-31 DIAGNOSIS — M5416 Radiculopathy, lumbar region: Secondary | ICD-10-CM | POA: Diagnosis not present

## 2019-08-31 MED ORDER — METHYLPREDNISOLONE ACETATE 80 MG/ML IJ SUSP
80.0000 mg | Freq: Once | INTRAMUSCULAR | Status: AC
  Administered 2019-08-31: 80 mg

## 2019-08-31 NOTE — Progress Notes (Signed)
Pt states lower back pain that travels to her left and right side of her hip. Pt states standing makes the pain worse. 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  Pt has hx of inj on 01/05/19 pt states it did fine till a few weeks ago.   Numeric Pain Rating Scale and Functional Assessment Average Pain 6   In the last MONTH (on 0-10 scale) has pain interfered with the following?  1. General activity like being  able to carry out your everyday physical activities such as walking, climbing stairs, carrying groceries, or moving a chair?  Rating(8)   +Driver, -BT, -Dye Allergies.

## 2019-09-01 NOTE — Procedures (Signed)
Lumbar Epidural Steroid Injection - Interlaminar Approach with Fluoroscopic Guidance  [name]      Date of Birth: 1945-05-14 MRN: [id number] PCP: Patient, No Pcp Per      Visit Date: 08/31/2019   Universal Protocol:     Consent Given By: the patient  Position: PRONE  Additional Comments: Vital signs were monitored before and after the procedure. Patient was prepped and draped in the usual sterile fashion. The correct patient, procedure, and site was verified.   Injection Procedure Details:  Procedure Site One Meds Administered:  Meds ordered this encounter  Medications  . methylPREDNISolone acetate (DEPO-MEDROL) injection 80 mg     Laterality: Right  Location/Site:  L5-S1  Needle size: 20 G  Needle type: Tuohy  Needle Placement: Paramedian epidural  Findings:   -Comments: Excellent flow of contrast into the epidural space.  Procedure Details: Using a paramedian approach from the side mentioned above, the region overlying the inferior lamina was localized under fluoroscopic visualization and the soft tissues overlying this structure were infiltrated with 4 ml. of 1% Lidocaine without Epinephrine. The Tuohy needle was inserted into the epidural space using a paramedian approach.   The epidural space was localized using loss of resistance along with lateral and bi-planar fluoroscopic views.  After negative aspirate for air, blood, and CSF, a 2 ml. volume of Isovue-250 was injected into the epidural space and the flow of contrast was observed. Radiographs were obtained for documentation purposes.    The injectate was administered into the level noted above.   Additional Comments:  The patient tolerated the procedure well Dressing: 2 x 2 sterile gauze and Band-Aid    Post-procedure details: Patient was observed during the procedure. Post-procedure instructions were reviewed.  Patient left the clinic in stable condition.

## 2019-09-01 NOTE — Progress Notes (Signed)
Brandy Delgado - 74 y.o. female MRN [id number]  Date of birth: 28-Jan-1945  Office Visit Note: Visit Date: 08/31/2019 PCP: Patient, No Pcp Per Referred by: No ref. provider found  Subjective: Chief Complaint  Patient presents with  . Lower Back - Pain   HPI:  Brandy Delgado is a 74 y.o. female who comes in today for planned repeat Right L5-S1 Lumbar epidural steroid injection with fluoroscopic guidance.  The patient has failed conservative care including home exercise, medications, time and activity modification.  This injection will be diagnostic and hopefully therapeutic.  Please see requesting physician notes for further details and justification. Patient received more than 50% pain relief from prior injection.   Referring: Dr. Meridee Score  History can be reviewed but patient does really extremely well with very intermittent epidural injection a couple times per year.  She has no red flag complaints.  She did have Mild Trauma Recently Where She the chair that she was cutting someone's hair to keep them from falling and she has had some issue with her foot since that time followed by Dr. Sharol Given.  She feels like since that time her back's been bothering her.  Once again we mention that we would update her MRI at some point but she just really does not have any red flag complaints to warrant a a new image at this point.  ROS Otherwise per HPI.  Assessment & Plan: Visit Diagnoses:  1. Lumbar radiculopathy     Plan: No additional findings.   Meds & Orders:  Meds ordered this encounter  Medications  . methylPREDNISolone acetate (DEPO-MEDROL) injection 80 mg    Orders Placed This Encounter  Procedures  . XR C-ARM NO REPORT  . Epidural Steroid injection    Follow-up: Return if symptoms worsen or fail to improve.   Procedures: No procedures performed  Lumbar Epidural Steroid Injection - Interlaminar Approach with Fluoroscopic Guidance  [name]      Date of Birth:  [birth year] MRN: [id number] PCP: Patient, No Pcp Per      Visit Date: 08/31/2019   Universal Protocol:     Consent Given By: the patient  Position: PRONE  Additional Comments: Vital signs were monitored before and after the procedure. Patient was prepped and draped in the usual sterile fashion. The correct patient, procedure, and site was verified.   Injection Procedure Details:  Procedure Site One Meds Administered:  Meds ordered this encounter  Medications  . methylPREDNISolone acetate (DEPO-MEDROL) injection 80 mg     Laterality: Right  Location/Site:  L5-S1  Needle size: 20 G  Needle type: Tuohy  Needle Placement: Paramedian epidural  Findings:   -Comments: Excellent flow of contrast into the epidural space.  Procedure Details: Using a paramedian approach from the side mentioned above, the region overlying the inferior lamina was localized under fluoroscopic visualization and the soft tissues overlying this structure were infiltrated with 4 ml. of 1% Lidocaine without Epinephrine. The Tuohy needle was inserted into the epidural space using a paramedian approach.   The epidural space was localized using loss of resistance along with lateral and bi-planar fluoroscopic views.  After negative aspirate for air, blood, and CSF, a 2 ml. volume of Isovue-250 was injected into the epidural space and the flow of contrast was observed. Radiographs were obtained for documentation purposes.    The injectate was administered into the level noted above.   Additional Comments:  The patient tolerated the procedure well Dressing: 2 x  2 sterile gauze and Band-Aid    Post-procedure details: Patient was observed during the procedure. Post-procedure instructions were reviewed.  Patient left the clinic in stable condition.    Clinical History: MRI LUMBAR SPINE WITHOUT CONTRAST  TECHNIQUE: Multiplanar, multisequence MR imaging was performed. No intravenous contrast was  administered.  COMPARISON:  DG LUMBAR SPINE COMPLETE dated 02/11/2013  FINDINGS: Lumbar vertebral bodies and posterior elements are intact and aligned with maintenance of the lumbar lordosis. Inferred mid lumbar dextroscoliosis on the axial sequences. Moderate to severe L2-3 and L3-4 disc height loss, mild at L1-2 with decreased T2 signal within all lumbar disc most consistent with mild to moderate desiccation. Mild to moderate acute on chronic discogenic endplate changes at D7-8, subacute to chronic at L3-4. Minimal acute discogenic endplate change at E4-2 . No suspicious STIR signal to suggest fracture.  Conus medullaris terminates at L1 and appears normal in morphology and signal characteristics. Cauda equina is unremarkable. Moderate to severe symmetric paraspinal muscle atrophy. Prevertebral soft tissues are nonsuspicious.  Level by level evaluation:  L1-2: 4 mm broad-based disc bulge asymmetric to the right may encroach upon the exited right L 1 nerve. Mild facet arthropathy and ligamentum flavum redundancy with trace facet effusions which are likely reactive. No canal stenosis. Mild right neural foraminal narrowing.  L2-3: 3 mm broad-based disc bulge asymmetric to the left. Mild facet arthropathy and ligamentum flavum redundancy with trace facet effusions which are likely reactive. No canal stenosis. Mild bilateral neural foraminal narrowing.  L3-4: 3 mm broad-based disc bulge, mild to moderate facet arthropathy and ligamentum flavum redundancy without canal stenosis. Mild right, moderate left neural foraminal narrowing.  L4-5: Minimal annular bulging with superimposed 1-2 mm right paracentral disc protrusion. Moderate facet arthropathy and ligamentum flavum redundancy with bilateral facet effusions, measuring up to 3 mm on the left. In addition, trace fluid signal within the interspinous process which may reflect Baastrup's disease. No canal stenosis though,  the disc protrusion slightly effaces the right lateral recess which may affect the traversing right L5 nerve. Mild to moderate left neural foraminal narrowing.  L5-S1: No disc bulge. Moderate to severe bilateral facet arthropathy with trace right facet effusion which is likely reactive. No canal stenosis. Minimal right neural foraminal narrowing.  IMPRESSION: Degenerative change of lumbar spine with mid lumbar dextroscoliosis as seen on prior lumbar radiographs.  No canal stenosis. Neural foraminal narrowing L2-3 through L5-S1: Moderate on the left at L3-4.  Multilevel facet disease with facet effusions which are likely reactive.  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   Electronically Signed   By: Elon Alas   On: 03/05/2013 00:57     Objective:  VS:  HT:    WT:   BMI:     BP:(!) 175/80  HR:68bpm  TEMP: ( )  RESP:  Physical Exam Constitutional:      General: She is not in acute distress.    Appearance: Normal appearance. She is not ill-appearing.  HENT:     Head: Normocephalic and atraumatic.     Right Ear: External ear normal.     Left Ear: External ear normal.  Eyes:     Extraocular Movements: Extraocular movements intact.  Cardiovascular:     Rate and Rhythm: Normal rate.     Pulses: Normal pulses.  Musculoskeletal:     Right lower leg: No edema.     Left lower leg: No edema.     Comments: Patient has good distal strength with no pain over the greater trochanters.  No clonus or focal weakness.  Skin:    Findings: No erythema, lesion or rash.  Neurological:     General: No focal deficit present.     Mental Status: She is alert and oriented to person, place, and time.     Sensory: No sensory deficit.     Motor: No weakness or abnormal muscle tone.     Coordination: Coordination normal.  Psychiatric:        Mood and Affect: Mood normal.        Behavior: Behavior normal.      Imaging: XR C-ARM NO REPORT  Result Date: 08/31/2019 Please see  Notes tab for imaging impression.

## 2020-01-19 ENCOUNTER — Ambulatory Visit (INDEPENDENT_AMBULATORY_CARE_PROVIDER_SITE_OTHER): Payer: Medicare Other | Admitting: Otolaryngology

## 2020-01-19 ENCOUNTER — Other Ambulatory Visit: Payer: Self-pay

## 2020-01-19 ENCOUNTER — Encounter (INDEPENDENT_AMBULATORY_CARE_PROVIDER_SITE_OTHER): Payer: Self-pay | Admitting: Otolaryngology

## 2020-01-19 VITALS — Temp 97.9°F

## 2020-01-19 DIAGNOSIS — J310 Chronic rhinitis: Secondary | ICD-10-CM | POA: Diagnosis not present

## 2020-01-19 DIAGNOSIS — J3489 Other specified disorders of nose and nasal sinuses: Secondary | ICD-10-CM

## 2020-01-19 NOTE — Progress Notes (Signed)
HPI: Brandy Delgado is a 75 y.o. female who returns today for evaluation of upper respiratory symptoms that began last Wednesday.  She has had a lot of sneezing and coughing and nasal congestion.  She also describes postnasal drainage and intermittent fullness of her ears.  She has had no fever.  She has not been coughing up any colored mucus or blowing out any colored mucus from her nose but does describe postnasal drainage.. She is also having a lot of nasal congestion which is worse at night. She has had no sense of loss of taste or smell.  Past Medical History:  Diagnosis Date  . Thyroid disease    No past surgical history on file. Social History   Socioeconomic History  . Marital status: Married    Spouse name: Not on file  . Number of children: Not on file  . Years of education: Not on file  . Highest education level: Not on file  Occupational History  . Not on file  Tobacco Use  . Smoking status: Never Smoker  . Smokeless tobacco: Never Used  Substance and Sexual Activity  . Alcohol use: No  . Drug use: No  . Sexual activity: Not on file  Other Topics Concern  . Not on file  Social History Narrative  . Not on file   Social Determinants of Health   Financial Resource Strain: Not on file  Food Insecurity: Not on file  Transportation Needs: Not on file  Physical Activity: Not on file  Stress: Not on file  Social Connections: Not on file   No family history on file. Allergies  Allergen Reactions  . Ampicillin    Prior to Admission medications   Medication Sig Start Date End Date Taking? Authorizing Provider  aspirin 81 MG chewable tablet Chew by mouth daily.    [provider]  Biotin 5000 MCG CAPS Take 1 capsule by mouth daily.    [provider]  cholecalciferol (VITAMIN D) 1000 UNITS tablet Take 2,000 Units by mouth daily.    [provider]  cyclobenzaprine (FLEXERIL) 5 MG tablet Take 1 tablet (5 mg total) by mouth 3 (three) times  daily as needed for muscle spasms. 02/11/13   Mabe, Latanya Maudlin, MD  ibuprofen (ADVIL,MOTRIN) 200 MG tablet Take 400 mg by mouth every 6 (six) hours as needed.    [provider]  levothyroxine (SYNTHROID, LEVOTHROID) 112 MCG tablet Take 112 mcg by mouth daily before breakfast.    [provider]  meloxicam (MOBIC) 15 MG tablet Take 1 tablet (15 mg total) by mouth daily. Take with food 07/24/19   Tyrell Antonio, MD  oxyCODONE-acetaminophen (PERCOCET/ROXICET) 5-325 MG per tablet Take 1-2 tablets by mouth every 6 (six) hours as needed for severe pain. 02/11/13   Mabe, Latanya Maudlin, MD  vitamin C (ASCORBIC ACID) 500 MG tablet Take 500 mg by mouth daily.    [provider]     Positive ROS: Otherwise negative  All other systems have been reviewed and were otherwise negative with the exception of those mentioned in the HPI and as above.  Physical Exam: Constitutional: Alert, well-appearing, no acute distress Ears: External ears without lesions or tenderness.  She has some wax buildup in the right ear canal that was removed.  However the TMs were clear bilaterally with good hearing in both ears and symmetric hearing with a 1024 tuning fork. Nasal: External nose without lesions. Septum slightly deviated to the left with moderate rhinitis.  After  decongesting the nose with Afrin nasal endoscopy was performed on nasal endoscopy she has slight mucus draining posteriorly on the right side but the left posterior nasal cavity was clear.  Both middle meatus regions were clear with no obvious mucopurulent discharge noted.  Nasopharynx was clear and eustachian tubes were unobstructed.. Oral: Lips and gums without lesions. Tongue and palate mucosa without lesions. Posterior oropharynx clear.  On examination of posterior oropharynx the posterior oropharyngeal wall was clear. Neck: No palpable adenopathy or masses Respiratory: Breathing comfortably  Skin: No facial/neck lesions or rash  noted.  Procedures  Assessment: Acute or chronic rhinitis probably viral in nature.  Plan: Place her on Nasacort 2 sprays each nostril at night and to start twice a day for the first 2 days.  Also discussed with her concerning using saline irrigation for postnasal drainage. I gave her a prescription for Z-Pak to take if she continues to have coughing.   Radene Journey, MD

## 2020-02-26 ENCOUNTER — Other Ambulatory Visit: Payer: Self-pay | Admitting: Physical Medicine and Rehabilitation

## 2020-02-26 ENCOUNTER — Telehealth: Payer: Self-pay

## 2020-02-26 MED ORDER — TRAMADOL HCL 50 MG PO TABS: 50.0000 mg | ORAL_TABLET | Freq: Three times a day (TID) | ORAL | 0 refills | Status: AC | PRN

## 2020-02-26 NOTE — Progress Notes (Signed)
Short course tramadol. Needs MRI

## 2020-02-26 NOTE — Telephone Encounter (Signed)
Patient states that the pain is different than the last time. I tried to determine if the pain was in the same area, but she said it was not. She was talking over me asking for an appointment. I could not get any answers out of her except that the pain was different and worse than the last time. I did schedule her for an office visit. Our next available appointment is on 2/23. She accepted that appointment and said "I guess I'll just have to suffer until then," and then she ended the call. She was also asking if she can take more than one meloxicam per day.

## 2020-02-26 NOTE — Telephone Encounter (Signed)
I sent in a few tramadol, keep meloxicam the same, can taje 1 es tylenol TID. She needs new MRI. Whether that can be done first and then consider OV/vs Injection

## 2020-02-26 NOTE — Telephone Encounter (Signed)
Called patient and left message advising re tramadol, meloxicam, and es tylenol.

## 2020-02-26 NOTE — Telephone Encounter (Signed)
Patient called she would like a call back regarding scheduling a appointment [phone]

## 2020-02-26 NOTE — Telephone Encounter (Signed)
Ok, we see her a couple times a year, need imaging at some point fyi

## 2020-02-26 NOTE — Telephone Encounter (Signed)
Right L5-S1 IL on 08/31/19. Ok to repeat if helped, same problem/side, and no new injury?

## 2020-03-04 ENCOUNTER — Telehealth: Payer: Self-pay | Admitting: Physical Medicine and Rehabilitation

## 2020-03-04 NOTE — Telephone Encounter (Signed)
Patient called. She would like to cancel her appointment with Dr. Ernestina Patches.

## 2020-03-07 NOTE — Telephone Encounter (Signed)
Appointment cancelled

## 2020-03-09 ENCOUNTER — Ambulatory Visit: Payer: Medicare Other | Admitting: Physical Medicine and Rehabilitation

## 2021-01-30 ENCOUNTER — Ambulatory Visit (INDEPENDENT_AMBULATORY_CARE_PROVIDER_SITE_OTHER): Payer: Medicare Other

## 2021-01-30 ENCOUNTER — Ambulatory Visit (INDEPENDENT_AMBULATORY_CARE_PROVIDER_SITE_OTHER): Payer: Medicare Other | Admitting: Orthopaedic Surgery

## 2021-01-30 VITALS — Ht 65.0 in | Wt 198.0 lb

## 2021-01-30 DIAGNOSIS — G8929 Other chronic pain: Secondary | ICD-10-CM | POA: Diagnosis not present

## 2021-01-30 DIAGNOSIS — M25561 Pain in right knee: Secondary | ICD-10-CM

## 2021-01-30 NOTE — Progress Notes (Signed)
Office Visit Note   [name]           Date of Birth: [birth year]           MRN: [id number] Visit Date: 01/30/2021              Requested by: No referring provider defined for this encounter. PCP: Patient, No Pcp Per (Inactive)   Assessment & Plan: Visit Diagnoses:  1. Chronic pain of right knee     Plan: Given the severity of the patient's pain combined with the severity of her arthritis and the failure of all modalities of conservative treatment for her right knee including steroid and hyaluronic acid injections, arthroscopic surgery, activity modification, weight loss and quad strengthening exercises, we are in agreement that a knee replacement would help her quite a bit.  I did discuss knee replacement surgery with her in detail showing her x-rays and a knee replacement model.  I described the risks and benefits of this type of surgery.  I talked about what to expect with the interoperative and postoperative course.  We will work on getting the surgery scheduled.  All question concerns were answered and addressed.  Follow-Up Instructions: Return for 2 weeks post-op.   Orders:  Orders Placed This Encounter  Procedures   XR Knee 1-2 Views Right   No orders of the defined types were placed in this encounter.     Procedures: No procedures performed   Clinical Data: No additional findings.   Subjective: Chief Complaint  Patient presents with   Right Knee - Pain  The patient is a very pleasant 76 year old female has been dealing with bilateral knee pain for some time now.  Its been at least 5 years.  She said that she needs her right knee replaced.  Her right knee hurts more significant in her left knee.  She has had multiple steroid injections in both knees and hyaluronic acid injections for both knees.  She has had arthroscopic surgery on both knees.  At this point her right knee pain is detrimentally affecting her mobility, her quality of life, and her actives  daily living.  Her pain is 10 out of 10.  It is affecting her gait towards affecting her posture.  Her left knee hurts as well.  She is not a diabetic and not on blood thinning medications.  Her daughter is with her today.  Again, she is requesting a right knee replacement given the failure of conservative treatment for well over 32months to 2 years plus.  HPI  Review of Systems She currently denies any headache, chest pain, shortness of breath, fever, chills, nausea, vomiting  Objective: Vital Signs: Ht 5\' 5"  (1.651 m)    Wt 198 lb (89.8 kg)    BMI 32.95 kg/m   Physical Exam She is alert and orient x3 and in no acute distress Ortho Exam Examination of both knees shows medial lateral joint line tenderness and significant patellofemoral crepitation.  Her right knee has severe medial tenderness and a palpable Baker's cyst in the popliteal region of her knee.  She does have good range of motion. Specialty Comments:  No specialty comments available.  Imaging: XR Knee 1-2 Views Right  Result Date: 01/30/2021 An AP and lateral the right knee also shows an AP of the left knee.  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  The alignment is neutral but there is complete loss of the medial joint space and almost complete loss of the  lateral joint space.  There is severe patellofemoral arthritic changes as well.    PMFS History: Patient Active Problem List   Diagnosis Date Noted   Impingement syndrome of right shoulder 09/25/2016   Impingement syndrome of left shoulder 12/12/2015   Past Medical History:  Diagnosis Date   Thyroid disease     No family history on file.  No past surgical history on file. Social History   Occupational History   Not on file  Tobacco Use   Smoking status: Never   Smokeless tobacco: Never  Substance and Sexual Activity   Alcohol use: No   Drug use: No   Sexual activity: Not on file

## 2021-03-17 NOTE — Progress Notes (Signed)
Sent message, via epic in basket, requesting orders in epic from surgeon.  

## 2021-03-21 NOTE — Patient Instructions (Addendum)
DUE TO COVID-19 ONLY ONE VISITOR IS ALLOWED TO COME WITH YOU AND STAY IN THE WAITING ROOM ONLY DURING PRE OP AND PROCEDURE.   **NO VISITORS ARE ALLOWED IN THE SHORT STAY AREA OR RECOVERY ROOM!!**  IF YOU WILL BE ADMITTED INTO THE HOSPITAL YOU ARE ALLOWED ONLY TWO SUPPORT PEOPLE DURING VISITATION HOURS ONLY (7 AM -8PM)   The support person(s) must pass our screening, gel in and out, and wear a mask at all times, including in the patients room. Patients must also wear a mask when staff or their support person are in the room. Visitors GUEST BADGE MUST BE WORN VISIBLY  One adult visitor may remain with you overnight and MUST be in the room by 8 P.M.  No visitors under the age of 65. Any visitor under the age of 42 must be accompanied by an adult.    COVID SWAB TESTING MUST BE COMPLETED ON:  04-05-21 @    (*ARRIVE AT YOUR APPOINTMENT TIME STAFF IS NOT HERE BEFORE 8AM!!!*)    Site: [facility] [address] [place] Enter: Main Entrance have a seat in the waiting area to the right of main entrance (DO NOT [place]!!!!!) Dial: [phone] to alert staff you have arrived  You are not required to quarantine, however you are required to wear a well-fitted mask when you are out and around people not in your household.  Hand Hygiene often Do NOT share personal items Notify your provider if you are in close contact with someone who has COVID or you develop fever 100.4 or greater, new onset of sneezing, cough, sore throat, shortness of breath or body aches.        Your procedure is scheduled on: Friday, 04-07-21   Report to [facility] Main Entrance    Report to admitting at 9:30 AM   Call this number if you have problems the morning of surgery [phone]   Do not eat food :After Midnight.   After Midnight you may have the following liquids until 9:15 AM DAY OF SURGERY  Water Black Coffee (sugar ok, NO MILK/CREAM OR CREAMERS)  Tea (sugar ok,  NO MILK/CREAM OR CREAMERS) regular and decaf                             Plain Jell-O (NO RED)                                           Fruit ices (not with fruit pulp, NO RED)                                     Popsicles (NO RED)                                                                  Juice: apple, WHITE grape, WHITE cranberry Sports drinks like Gatorade (NO RED) Clear broth(vegetable,chicken,beef)               Drink 1 Ensure drink AT 9:15  AM the day of surgery.       The day of surgery:  Drink ONE (1) Pre-Surgery Clear Ensure the morning of surgery. Drink in one sitting. Do not sip.  This drink was given to you during your hospital  pre-op appointment visit. Nothing else to drink after completing the Pre-Surgery Clear Ensure          If you have questions, please contact your surgeons office.   FOLLOW BOWEL PREP AND ANY ADDITIONAL PRE OP INSTRUCTIONS YOU RECEIVED FROM YOUR SURGEON'S OFFICE!!!     Oral Hygiene is also important to reduce your risk of infection.                                    Remember - BRUSH YOUR TEETH THE MORNING OF SURGERY WITH YOUR REGULAR TOOTHPASTE   Do NOT smoke after Midnight   Take these medicines the morning of surgery with A SIP OF WATER: Levothyroxine, Zyrtec                              You may not have any metal on your body including hair pins, jewelry, and body piercing             Do not wear make-up, lotions, powders, perfumes or deodorant  Do not wear nail polish including gel and S&S, artificial/acrylic nails, or any other type of covering on natural nails including finger and toenails. If you have artificial nails, gel coating, etc. that needs to be removed by a nail salon please have this removed prior to surgery or surgery may need to be canceled/ delayed if the surgeon/ anesthesia feels like they are unable to be safely monitored.   Do not shave  48 hours prior to surgery.    Do not bring valuables to the hospital.  [place].   Contacts, dentures or bridgework may not be worn into surgery.   Bring small overnight bag day of surgery.  Special Instructions: Bring a copy of your healthcare power of attorney and living will documents the day of surgery if you haven't scanned them before.  Please read over the following fact sheets you were given: IF YOU HAVE QUESTIONS ABOUT YOUR PRE-OP INSTRUCTIONS PLEASE CALL [facility] - Preparing for Surgery Before surgery, you can play an important role.  Because skin is not sterile, your skin needs to be as free of germs as possible.  You can reduce the number of germs on your skin by washing with CHG (chlorahexidine gluconate) soap before surgery.  CHG is an antiseptic cleaner which kills germs and bonds with the skin to continue killing germs even after washing. Please DO NOT use if you have an allergy to CHG or antibacterial soaps.  If your skin becomes reddened/irritated stop using the CHG and inform your nurse when you arrive at Short Stay. Do not shave (including legs and underarms) for at least 48 hours prior to the first CHG shower.  You may shave your face/neck.  Please follow these instructions carefully:  1.  Shower with CHG Soap the night before surgery and the  morning of surgery.  2.  If you choose to wash your hair, wash your hair first as usual with your normal  shampoo.  3.  After you shampoo, rinse your hair and body  thoroughly to remove the shampoo.                             4.  Use CHG as you would any other liquid soap.  You can apply chg directly to the skin and wash.  Gently with a scrungie or clean washcloth.  5.  Apply the CHG Soap to your body ONLY FROM THE NECK DOWN.   Do   not use on face/ open                           Wound or open sores. Avoid contact with eyes, ears mouth and   genitals (private parts).                       Wash face,  Genitals (private parts) with your normal soap.              6.  Wash thoroughly, paying special attention to the area where your    surgery  will be performed.  7.  Thoroughly rinse your body with warm water from the neck down.  8.  DO NOT shower/wash with your normal soap after using and rinsing off the CHG Soap.                9.  Pat yourself dry with a clean towel.            10.  Wear clean pajamas.            11.  Place clean sheets on your bed the night of your first shower and do not  sleep with pets. Day of Surgery : Do not apply any lotions/deodorants the morning of surgery.  Please wear clean clothes to the hospital/surgery center.  FAILURE TO FOLLOW THESE INSTRUCTIONS MAY RESULT IN THE CANCELLATION OF YOUR SURGERY  PATIENT SIGNATURE_________________________________  NURSE SIGNATURE__________________________________  ________________________________________________________________________    Adam Phenix  An incentive spirometer is a tool that can help keep your lungs clear and active. This tool measures how well you are filling your lungs with each breath. Taking long deep breaths may help reverse or decrease the chance of developing breathing (pulmonary) problems (especially infection) following: A long period of time when you are unable to move or be active. BEFORE THE PROCEDURE  If the spirometer includes an indicator to show your best effort, your nurse or respiratory therapist will set it to a desired goal. If possible, sit up straight or lean slightly forward. Try not to slouch. Hold the incentive spirometer in an upright position. INSTRUCTIONS FOR USE  Sit on the edge of your bed if possible, or sit up as far as you can in bed or on a chair. Hold the incentive spirometer in an upright position. Breathe out normally. Place the mouthpiece in your mouth and seal your lips tightly around it. Breathe in slowly and as deeply as possible, raising the piston or the ball toward the top of the column. Hold your  breath for 3-5 seconds or for as long as possible. Allow the piston or ball to fall to the bottom of the column. Remove the mouthpiece from your mouth and breathe out normally. Rest for a few seconds and repeat Steps 1 through 7 at least 10 times every 1-2 hours when you are awake. Take your time and take a few normal breaths between deep breaths. The  spirometer may include an indicator to show your best effort. Use the indicator as a goal to work toward during each repetition. After each set of 10 deep breaths, practice coughing to be sure your lungs are clear. If you have an incision (the cut made at the time of surgery), support your incision when coughing by placing a pillow or rolled up towels firmly against it. Once you are able to get out of bed, walk around indoors and cough well. You may stop using the incentive spirometer when instructed by your caregiver.  RISKS AND COMPLICATIONS Take your time so you do not get dizzy or light-headed. If you are in pain, you may need to take or ask for pain medication before doing incentive spirometry. It is harder to take a deep breath if you are having pain. AFTER USE Rest and breathe slowly and easily. It can be helpful to keep track of a log of your progress. Your caregiver can provide you with a simple table to help with this. If you are using the spirometer at home, follow these instructions: [place] IF:  You are having difficultly using the spirometer. You have trouble using the spirometer as often as instructed. Your pain medication is not giving enough relief while using the spirometer. You develop fever of 100.5 F (38.1 C) or higher. SEEK IMMEDIATE MEDICAL CARE IF:  You cough up bloody sputum that had not been present before. You develop fever of 102 F (38.9 C) or greater. You develop worsening pain at or near the incision site. MAKE SURE YOU:  Understand these instructions. Will watch your condition. Will get help right  away if you are not doing well or get worse. Document Released: 05/14/2006 Document Revised: 03/26/2011 Document Reviewed: 07/15/2006 [facility] Patient Information 2014 [place], [place].   ________________________________________________________________________

## 2021-03-28 ENCOUNTER — Other Ambulatory Visit: Payer: Self-pay

## 2021-03-28 ENCOUNTER — Encounter (HOSPITAL_COMMUNITY): Payer: Self-pay

## 2021-03-28 ENCOUNTER — Encounter (HOSPITAL_COMMUNITY)
Admission: RE | Admit: 2021-03-28 | Discharge: 2021-03-28 | Disposition: A | Discharge status: Home / Self Care | Payer: Medicare Other | Source: Ambulatory Visit | Attending: Orthopaedic Surgery | Admitting: Orthopaedic Surgery

## 2021-03-28 VITALS — BP 169/78 | HR 75 | Temp 98.1°F | Resp 16 | Ht 65.0 in | Wt 195.2 lb

## 2021-03-28 DIAGNOSIS — Z20822 Contact with and (suspected) exposure to covid-19: Secondary | ICD-10-CM | POA: Insufficient documentation

## 2021-03-28 DIAGNOSIS — Z01812 Encounter for preprocedural laboratory examination: Secondary | ICD-10-CM | POA: Insufficient documentation

## 2021-03-28 DIAGNOSIS — Z01818 Encounter for other preprocedural examination: Secondary | ICD-10-CM

## 2021-03-28 HISTORY — DX: Unspecified osteoarthritis, unspecified site: M19.90

## 2021-03-28 HISTORY — DX: Hypothyroidism, unspecified: E03.9

## 2021-03-28 HISTORY — DX: Unspecified malignant neoplasm of skin, unspecified: C44.90

## 2021-03-28 LAB — CBC
HCT: 39.1 % (ref 36.0–46.0)
Hemoglobin: 12.2 g/dL (ref 12.0–15.0)
MCH: 27.2 pg (ref 26.0–34.0)
MCHC: 31.2 g/dL (ref 30.0–36.0)
MCV: 87.3 fL (ref 80.0–100.0)
Platelets: 367 10*3/uL (ref 150–400)
RBC: 4.48 MIL/uL (ref 3.87–5.11)
RDW: 14.5 % (ref 11.5–15.5)
WBC: 9.4 10*3/uL (ref 4.0–10.5)
nRBC: 0 % (ref 0.0–0.2)

## 2021-03-28 LAB — SURGICAL PCR SCREEN
MRSA, PCR: NEGATIVE
Staphylococcus aureus: NEGATIVE

## 2021-03-28 NOTE — Progress Notes (Signed)
COVID swab appointment:  N/A ? ?COVID Vaccine Completed:  Yes x2 ?Date COVID Vaccine completed: ?Has received booster:  Yes x1 ?COVID vaccine manufacturer: Pfizer     ? ?Date of COVID positive in last 90 days:  No ? ?PCP - No PCP ?Cardiologist - N/A ? ?Chest x-ray - N/A ?EKG - N/A ?Stress Test - N/A ?ECHO - N/A ?Cardiac Cath - N/A ?Pacemaker/ICD device last checked: ?Spinal Cord Stimulator: ? ?Bowel Prep - N/A ? ?Sleep Study - N/A ?CPAP -  ? ?Fasting Blood Sugar - N/A ?Checks Blood Sugar _____ times a day ? ?Blood Thinner Instructions: ?Aspirin Instructions:  ASA 81 mg.  To stop a week before per patient ?Last Dose: ? ?Activity level:   Can go up a flight of stairs and perform activities of daily living without stopping and without symptoms of chest pain or shortness of breath. ?   ?Anesthesia review: N/A ? ?Patient denies shortness of breath, fever, cough and chest pain at PAT appointment ? ?Patient verbalized understanding of instructions that were given to them at the PAT appointment. Patient was also instructed that they will need to review over the PAT instructions again at home before surgery.  ?

## 2021-03-30 ENCOUNTER — Other Ambulatory Visit: Payer: Self-pay | Admitting: Physician Assistant

## 2021-04-03 ENCOUNTER — Telehealth: Payer: Self-pay

## 2021-04-03 NOTE — Telephone Encounter (Signed)
Patient called asking if she can take 3 Advil before she goes to bed on Thursday.  ? ?Please advise  ?

## 2021-04-04 NOTE — Telephone Encounter (Signed)
LMOM for patient of the below message  

## 2021-04-06 ENCOUNTER — Encounter (HOSPITAL_COMMUNITY): Payer: Self-pay | Admitting: Orthopaedic Surgery

## 2021-04-06 DIAGNOSIS — M1711 Unilateral primary osteoarthritis, right knee: Secondary | ICD-10-CM

## 2021-04-06 HISTORY — DX: Unilateral primary osteoarthritis, right knee: M17.11

## 2021-04-06 NOTE — H&P (Signed)
TOTAL KNEE ADMISSION H&P ? ?Patient is being admitted for right total knee arthroplasty. ? ?Subjective: ? ?Chief Complaint:right knee pain. ? ?HPI: Brandy Delgado, 76 y.o. female, has a history of pain and functional disability in the right knee due to arthritis and has failed non-surgical conservative treatments for greater than 12 weeks to includeNSAID's and/or analgesics, corticosteriod injections, viscosupplementation injections, flexibility and strengthening excercises, use of assistive devices, and activity modification.  Onset of symptoms was gradual, starting 5 years ago with gradually worsening course since that time. The patient noted prior procedures on the knee to include  arthroscopy on the right knee(s).  Patient currently rates pain in the right knee(s) at 10 out of 10 with activity. Patient has night pain, worsening of pain with activity and weight bearing, pain that interferes with activities of daily living, pain with passive range of motion, crepitus, and joint swelling.  Patient has evidence of subchondral sclerosis, periarticular osteophytes, and joint space narrowing by imaging studies. There is no active infection. ? ?Patient Active Problem List  ? Diagnosis Date Noted  ? Unilateral primary osteoarthritis, right knee 04/06/2021  ? Impingement syndrome of right shoulder 09/25/2016  ? Impingement syndrome of left shoulder 12/12/2015  ? ?Past Medical History:  ?Diagnosis Date  ? Arthritis   ? Hypothyroidism   ? Skin cancer   ? Forehead  ? Thyroid disease   ? Unilateral primary osteoarthritis, right knee 04/06/2021  ?  ?Past Surgical History:  ?Procedure Laterality Date  ? Arm surgery Right   ? CATARACT EXTRACTION W/ INTRAOCULAR LENS IMPLANT Bilateral   ? KNEE ARTHROSCOPY Bilateral   ?  ?No current facility-administered medications for this encounter.  ? ?Current Outpatient Medications  ?Medication Sig Dispense Refill Last Dose  ? Ascorbic Acid (VITAMIN C) 1000 MG tablet Take 1,000 mg by mouth  daily.     ? aspirin 81 MG chewable tablet Chew by mouth daily.     ? Biotin 5000 MCG CAPS Take 1 capsule by mouth daily.     ? cetirizine (ZYRTEC) 10 MG tablet Take 5 mg by mouth daily as needed for allergies.     ? Cholecalciferol (DIALYVITE VITAMIN D 5000) 125 MCG (5000 UT) capsule Take 5,000 Units by mouth daily.     ? ibuprofen (ADVIL,MOTRIN) 200 MG tablet Take 600 mg by mouth daily as needed for moderate pain.     ? levothyroxine (SYNTHROID, LEVOTHROID) 112 MCG tablet Take 112 mcg by mouth daily before breakfast.     ? Menthol-Methyl Salicylate (SALONPAS PAIN RELIEF PATCH EX) Apply 2 patches topically daily as needed (pain).     ? OVER THE COUNTER MEDICATION Take 1 tablet by mouth daily. Beet root supplement     ? vitamin B-12 (CYANOCOBALAMIN) 1000 MCG tablet Take 1,000 mcg by mouth daily.     ? ?Allergies  ?Allergen Reactions  ? Ampicillin Swelling  ? Sulfa Antibiotics   ?  Unknown reaction   ?  ?Social History  ? ?Tobacco Use  ? Smoking status: Never  ? Smokeless tobacco: Never  ?Substance Use Topics  ? Alcohol use: No  ?  ?No family history on file.  ? ?Review of Systems  ?Musculoskeletal:  Positive for gait problem and joint swelling.  ?All other systems reviewed and are negative. ? ?Objective: ? ?Physical Exam ?Vitals reviewed.  ?Constitutional:   ?   Appearance: Normal appearance.  ?HENT:  ?   Head: Normocephalic and atraumatic.  ?Eyes:  ?   Extraocular Movements: Extraocular movements  intact.  ?   Pupils: Pupils are equal, round, and reactive to light.  ?Cardiovascular:  ?   Rate and Rhythm: Normal rate.  ?   Pulses: Normal pulses.  ?Pulmonary:  ?   Effort: Pulmonary effort is normal.  ?Abdominal:  ?   Palpations: Abdomen is soft.  ?Musculoskeletal:  ?   Cervical back: Normal range of motion and neck supple.  ?   Right knee: Effusion, bony tenderness and crepitus present. Decreased range of motion. Tenderness present over the medial joint line, lateral joint line and patellar tendon. Abnormal alignment.   ?Neurological:  ?   Mental Status: She is alert and oriented to person, place, and time.  ?Psychiatric:     ?   Behavior: Behavior normal.  ? ? ?Vital signs in last 24 hours: ?  ? ?Labs: ? ? ?Estimated body mass index is 32.49 kg/m? as calculated from the following: ?  Height as of 03/28/21: '5\' 5"'$  (1.651 m). ?  Weight as of 03/28/21: 88.6 kg. ? ? ?Imaging Review ?Plain radiographs demonstrate severe degenerative joint disease of the right knee(s). The overall alignment ismild varus. The bone quality appears to be good for age and reported activity level. ? ? ? ? ? ?Assessment/Plan: ? ?End stage arthritis, right knee  ? ?The patient history, physical examination, clinical judgment of the provider and imaging studies are consistent with end stage degenerative joint disease of the right knee(s) and total knee arthroplasty is deemed medically necessary. The treatment options including medical management, injection therapy arthroscopy and arthroplasty were discussed at length. The risks and benefits of total knee arthroplasty were presented and reviewed. The risks due to aseptic loosening, infection, stiffness, patella tracking problems, thromboembolic complications and other imponderables were discussed. The patient acknowledged the explanation, agreed to proceed with the plan and consent was signed. Patient is being admitted for inpatient treatment for surgery, pain control, PT, OT, prophylactic antibiotics, VTE prophylaxis, progressive ambulation and ADL's and discharge planning. The patient is planning to be discharged home with home health services ? ? ? ? ?

## 2021-04-07 ENCOUNTER — Observation Stay (HOSPITAL_COMMUNITY)
Admission: RE | Admit: 2021-04-07 | Discharge: 2021-04-09 | Disposition: A | Discharge status: Home Health | Payer: Medicare Other | Source: Ambulatory Visit | Attending: Orthopaedic Surgery | Admitting: Orthopaedic Surgery

## 2021-04-07 ENCOUNTER — Ambulatory Visit (HOSPITAL_COMMUNITY): Payer: Medicare Other | Admitting: Anesthesiology

## 2021-04-07 ENCOUNTER — Encounter (HOSPITAL_COMMUNITY)
Admission: RE | Disposition: A | Discharge status: Home Health | Payer: Self-pay | Source: Ambulatory Visit | Attending: Orthopaedic Surgery

## 2021-04-07 ENCOUNTER — Other Ambulatory Visit: Payer: Self-pay

## 2021-04-07 ENCOUNTER — Ambulatory Visit (HOSPITAL_BASED_OUTPATIENT_CLINIC_OR_DEPARTMENT_OTHER): Payer: Medicare Other | Admitting: Anesthesiology

## 2021-04-07 ENCOUNTER — Observation Stay (HOSPITAL_COMMUNITY): Payer: Medicare Other

## 2021-04-07 ENCOUNTER — Encounter (HOSPITAL_COMMUNITY): Payer: Self-pay | Admitting: Orthopaedic Surgery

## 2021-04-07 DIAGNOSIS — Z79899 Other long term (current) drug therapy: Secondary | ICD-10-CM | POA: Insufficient documentation

## 2021-04-07 DIAGNOSIS — M1711 Unilateral primary osteoarthritis, right knee: Secondary | ICD-10-CM

## 2021-04-07 DIAGNOSIS — Z7982 Long term (current) use of aspirin: Secondary | ICD-10-CM | POA: Insufficient documentation

## 2021-04-07 DIAGNOSIS — M25511 Pain in right shoulder: Secondary | ICD-10-CM | POA: Insufficient documentation

## 2021-04-07 DIAGNOSIS — Z9181 History of falling: Secondary | ICD-10-CM | POA: Insufficient documentation

## 2021-04-07 DIAGNOSIS — M25512 Pain in left shoulder: Secondary | ICD-10-CM | POA: Diagnosis not present

## 2021-04-07 DIAGNOSIS — M25461 Effusion, right knee: Secondary | ICD-10-CM | POA: Diagnosis not present

## 2021-04-07 DIAGNOSIS — Z85828 Personal history of other malignant neoplasm of skin: Secondary | ICD-10-CM | POA: Diagnosis not present

## 2021-04-07 DIAGNOSIS — M25761 Osteophyte, right knee: Secondary | ICD-10-CM | POA: Diagnosis not present

## 2021-04-07 DIAGNOSIS — R110 Nausea: Secondary | ICD-10-CM | POA: Insufficient documentation

## 2021-04-07 DIAGNOSIS — Z791 Long term (current) use of non-steroidal anti-inflammatories (NSAID): Secondary | ICD-10-CM | POA: Insufficient documentation

## 2021-04-07 DIAGNOSIS — M549 Dorsalgia, unspecified: Secondary | ICD-10-CM | POA: Insufficient documentation

## 2021-04-07 DIAGNOSIS — E039 Hypothyroidism, unspecified: Secondary | ICD-10-CM | POA: Diagnosis not present

## 2021-04-07 DIAGNOSIS — M238X1 Other internal derangements of right knee: Secondary | ICD-10-CM | POA: Insufficient documentation

## 2021-04-07 DIAGNOSIS — R2689 Other abnormalities of gait and mobility: Secondary | ICD-10-CM | POA: Insufficient documentation

## 2021-04-07 DIAGNOSIS — M25561 Pain in right knee: Secondary | ICD-10-CM | POA: Diagnosis not present

## 2021-04-07 DIAGNOSIS — Z96651 Presence of right artificial knee joint: Secondary | ICD-10-CM

## 2021-04-07 HISTORY — PX: TOTAL KNEE ARTHROPLASTY: SHX125

## 2021-04-07 LAB — ABO/RH: ABO/RH(D): A POS

## 2021-04-07 LAB — TYPE AND SCREEN
ABO/RH(D): A POS
Antibody Screen: NEGATIVE

## 2021-04-07 SURGERY — ARTHROPLASTY, KNEE, TOTAL
Anesthesia: Monitor Anesthesia Care | Site: Knee | Laterality: Right

## 2021-04-07 MED ORDER — METHOCARBAMOL 500 MG PO TABS
500.0000 mg | ORAL_TABLET | Freq: Four times a day (QID) | ORAL | Status: DC | PRN
  Administered 2021-04-07 – 2021-04-09 (×5): 500 mg via ORAL
  Filled 2021-04-07 (×6): qty 1

## 2021-04-07 MED ORDER — BIOTIN 5000 MCG PO CAPS: 1.0000 | ORAL_CAPSULE | Freq: Every day | ORAL | Status: DC

## 2021-04-07 MED ORDER — DEXAMETHASONE SODIUM PHOSPHATE 10 MG/ML IJ SOLN
INTRAMUSCULAR | Status: DC | PRN
  Administered 2021-04-07: 10 mg via INTRAVENOUS

## 2021-04-07 MED ORDER — ASPIRIN 81 MG PO CHEW
81.0000 mg | CHEWABLE_TABLET | Freq: Two times a day (BID) | ORAL | Status: DC
  Administered 2021-04-07 – 2021-04-09 (×4): 81 mg via ORAL
  Filled 2021-04-07 (×4): qty 1

## 2021-04-07 MED ORDER — LEVOTHYROXINE SODIUM 112 MCG PO TABS
112.0000 ug | ORAL_TABLET | Freq: Every day | ORAL | Status: DC
  Administered 2021-04-08 – 2021-04-09 (×2): 112 ug via ORAL
  Filled 2021-04-07 (×2): qty 1

## 2021-04-07 MED ORDER — PHENOL 1.4 % MT LIQD
1.0000 | OROMUCOSAL | Status: DC | PRN
  Administered 2021-04-09: 1 via OROMUCOSAL
  Filled 2021-04-07: qty 177

## 2021-04-07 MED ORDER — FENTANYL CITRATE PF 50 MCG/ML IJ SOSY
PREFILLED_SYRINGE | INTRAMUSCULAR | Status: AC
  Administered 2021-04-07: 50 ug via INTRAVENOUS
  Filled 2021-04-07: qty 1

## 2021-04-07 MED ORDER — POVIDONE-IODINE 10 % EX SWAB
2.0000 "application " | Freq: Once | CUTANEOUS | Status: AC
  Administered 2021-04-07: 2 via TOPICAL

## 2021-04-07 MED ORDER — ACETAMINOPHEN 160 MG/5ML PO SOLN: 325.0000 mg | ORAL | Status: DC | PRN

## 2021-04-07 MED ORDER — ACETAMINOPHEN 325 MG PO TABS: 325.0000 mg | ORAL_TABLET | ORAL | Status: DC | PRN

## 2021-04-07 MED ORDER — CHLORHEXIDINE GLUCONATE 0.12 % MT SOLN: 15.0000 mL | Freq: Once | OROMUCOSAL | Status: AC

## 2021-04-07 MED ORDER — VITAMIN B-12 1000 MCG PO TABS
1000.0000 ug | ORAL_TABLET | Freq: Every day | ORAL | Status: DC
  Administered 2021-04-09: 1000 ug via ORAL
  Filled 2021-04-07 (×2): qty 1

## 2021-04-07 MED ORDER — OXYCODONE HCL 5 MG PO TABS
5.0000 mg | ORAL_TABLET | ORAL | Status: DC | PRN
  Administered 2021-04-07 (×2): 10 mg via ORAL
  Filled 2021-04-07 (×2): qty 2

## 2021-04-07 MED ORDER — ROPIVACAINE HCL 7.5 MG/ML IJ SOLN
INTRAMUSCULAR | Status: DC | PRN
  Administered 2021-04-07: 20 mL via PERINEURAL

## 2021-04-07 MED ORDER — ACETAMINOPHEN 325 MG PO TABS
325.0000 mg | ORAL_TABLET | Freq: Four times a day (QID) | ORAL | Status: DC | PRN
  Administered 2021-04-08 – 2021-04-09 (×4): 650 mg via ORAL
  Filled 2021-04-07 (×4): qty 2

## 2021-04-07 MED ORDER — OXYCODONE HCL 5 MG PO TABS: 5.0000 mg | ORAL_TABLET | Freq: Once | ORAL | Status: DC | PRN

## 2021-04-07 MED ORDER — FENTANYL CITRATE PF 50 MCG/ML IJ SOSY
25.0000 ug | PREFILLED_SYRINGE | INTRAMUSCULAR | Status: DC | PRN
  Administered 2021-04-07: 25 ug via INTRAVENOUS

## 2021-04-07 MED ORDER — PROPOFOL 10 MG/ML IV BOLUS
INTRAVENOUS | Status: DC | PRN
  Administered 2021-04-07: 40 mg via INTRAVENOUS

## 2021-04-07 MED ORDER — DIPHENHYDRAMINE HCL 12.5 MG/5ML PO ELIX: 12.5000 mg | ORAL_SOLUTION | ORAL | Status: DC | PRN

## 2021-04-07 MED ORDER — STERILE WATER FOR IRRIGATION IR SOLN
Status: DC | PRN
  Administered 2021-04-07: 2000 mL

## 2021-04-07 MED ORDER — PHENYLEPHRINE HCL (PRESSORS) 10 MG/ML IV SOLN
INTRAVENOUS | Status: AC
  Filled 2021-04-07: qty 1

## 2021-04-07 MED ORDER — METOCLOPRAMIDE HCL 5 MG/ML IJ SOLN
5.0000 mg | Freq: Three times a day (TID) | INTRAMUSCULAR | Status: DC | PRN
  Filled 2021-04-07: qty 2

## 2021-04-07 MED ORDER — METHOCARBAMOL 500 MG IVPB - SIMPLE MED
INTRAVENOUS | Status: AC
  Administered 2021-04-07: 500 mg via INTRAVENOUS
  Filled 2021-04-07: qty 50

## 2021-04-07 MED ORDER — METHOCARBAMOL 500 MG IVPB - SIMPLE MED
500.0000 mg | Freq: Four times a day (QID) | INTRAVENOUS | Status: DC | PRN
  Filled 2021-04-07: qty 50

## 2021-04-07 MED ORDER — OXYCODONE HCL 5 MG/5ML PO SOLN: 5.0000 mg | Freq: Once | ORAL | Status: DC | PRN

## 2021-04-07 MED ORDER — PHENYLEPHRINE HCL-NACL 20-0.9 MG/250ML-% IV SOLN
INTRAVENOUS | Status: DC | PRN
  Administered 2021-04-07: 50 ug/min via INTRAVENOUS

## 2021-04-07 MED ORDER — ASCORBIC ACID 500 MG PO TABS
1000.0000 mg | ORAL_TABLET | Freq: Every day | ORAL | Status: DC
  Administered 2021-04-09: 1000 mg via ORAL
  Filled 2021-04-07 (×2): qty 2

## 2021-04-07 MED ORDER — OXYCODONE HCL 5 MG PO TABS
10.0000 mg | ORAL_TABLET | ORAL | Status: DC | PRN
  Administered 2021-04-08 (×2): 15 mg via ORAL
  Filled 2021-04-07 (×2): qty 3

## 2021-04-07 MED ORDER — CLINDAMYCIN PHOSPHATE 900 MG/50ML IV SOLN
900.0000 mg | INTRAVENOUS | Status: AC
  Administered 2021-04-07: 900 mg via INTRAVENOUS
  Filled 2021-04-07: qty 50

## 2021-04-07 MED ORDER — SODIUM CHLORIDE 0.9 % IV SOLN: INTRAVENOUS | Status: DC

## 2021-04-07 MED ORDER — BUPIVACAINE-EPINEPHRINE (PF) 0.25% -1:200000 IJ SOLN
INTRAMUSCULAR | Status: AC
  Filled 2021-04-07: qty 30

## 2021-04-07 MED ORDER — VITAMIN D 25 MCG (1000 UNIT) PO TABS
5000.0000 [IU] | ORAL_TABLET | Freq: Every day | ORAL | Status: DC
  Administered 2021-04-09: 5000 [IU] via ORAL
  Filled 2021-04-07 (×2): qty 5

## 2021-04-07 MED ORDER — BUPIVACAINE-EPINEPHRINE 0.25% -1:200000 IJ SOLN
INTRAMUSCULAR | Status: DC | PRN
Start: 2021-04-07 — End: 2021-04-07
  Administered 2021-04-07: 30 mL

## 2021-04-07 MED ORDER — MIDAZOLAM HCL 2 MG/2ML IJ SOLN
1.0000 mg | INTRAMUSCULAR | Status: DC
  Administered 2021-04-07: 2 mg via INTRAVENOUS
  Filled 2021-04-07: qty 2

## 2021-04-07 MED ORDER — ONDANSETRON HCL 4 MG/2ML IJ SOLN
INTRAMUSCULAR | Status: DC | PRN
  Administered 2021-04-07: 4 mg via INTRAVENOUS

## 2021-04-07 MED ORDER — DOCUSATE SODIUM 100 MG PO CAPS
100.0000 mg | ORAL_CAPSULE | Freq: Two times a day (BID) | ORAL | Status: DC
  Administered 2021-04-07 – 2021-04-09 (×4): 100 mg via ORAL
  Filled 2021-04-07 (×4): qty 1

## 2021-04-07 MED ORDER — BUPIVACAINE IN DEXTROSE 0.75-8.25 % IT SOLN
INTRATHECAL | Status: DC | PRN
  Administered 2021-04-07: 1.7 mL via INTRATHECAL

## 2021-04-07 MED ORDER — FENTANYL CITRATE PF 50 MCG/ML IJ SOSY
50.0000 ug | PREFILLED_SYRINGE | INTRAMUSCULAR | Status: DC
  Administered 2021-04-07: 100 ug via INTRAVENOUS
  Filled 2021-04-07: qty 2

## 2021-04-07 MED ORDER — PANTOPRAZOLE SODIUM 40 MG PO TBEC
40.0000 mg | DELAYED_RELEASE_TABLET | Freq: Every day | ORAL | Status: DC
  Administered 2021-04-08 – 2021-04-09 (×2): 40 mg via ORAL
  Filled 2021-04-07 (×2): qty 1

## 2021-04-07 MED ORDER — SODIUM CHLORIDE 0.9 % IR SOLN
Status: DC | PRN
Start: 2021-04-07 — End: 2021-04-07
  Administered 2021-04-07: 1000 mL

## 2021-04-07 MED ORDER — CEFAZOLIN SODIUM-DEXTROSE 1-4 GM/50ML-% IV SOLN
1.0000 g | Freq: Four times a day (QID) | INTRAVENOUS | Status: AC
  Administered 2021-04-07 – 2021-04-08 (×2): 1 g via INTRAVENOUS
  Filled 2021-04-07 (×2): qty 50

## 2021-04-07 MED ORDER — ONDANSETRON HCL 4 MG PO TABS: 4.0000 mg | ORAL_TABLET | Freq: Four times a day (QID) | ORAL | Status: DC | PRN

## 2021-04-07 MED ORDER — ONDANSETRON HCL 4 MG/2ML IJ SOLN
4.0000 mg | Freq: Four times a day (QID) | INTRAMUSCULAR | Status: DC | PRN
  Administered 2021-04-07 – 2021-04-08 (×2): 4 mg via INTRAVENOUS
  Filled 2021-04-07 (×2): qty 2

## 2021-04-07 MED ORDER — TRANEXAMIC ACID-NACL 1000-0.7 MG/100ML-% IV SOLN
1000.0000 mg | INTRAVENOUS | Status: AC
  Administered 2021-04-07: 1000 mg via INTRAVENOUS
  Filled 2021-04-07: qty 100

## 2021-04-07 MED ORDER — MENTHOL 3 MG MT LOZG: 1.0000 | LOZENGE | OROMUCOSAL | Status: DC | PRN

## 2021-04-07 MED ORDER — MEPERIDINE HCL 50 MG/ML IJ SOLN: 6.2500 mg | INTRAMUSCULAR | Status: DC | PRN

## 2021-04-07 MED ORDER — ONDANSETRON HCL 4 MG/2ML IJ SOLN: 4.0000 mg | Freq: Once | INTRAMUSCULAR | Status: DC | PRN

## 2021-04-07 MED ORDER — 0.9 % SODIUM CHLORIDE (POUR BTL) OPTIME
TOPICAL | Status: DC | PRN
Start: 2021-04-07 — End: 2021-04-07
  Administered 2021-04-07: 1000 mL

## 2021-04-07 MED ORDER — HYDROMORPHONE HCL 1 MG/ML IJ SOLN
0.5000 mg | INTRAMUSCULAR | Status: DC | PRN
  Administered 2021-04-07: 1 mg via INTRAVENOUS
  Filled 2021-04-07: qty 1

## 2021-04-07 MED ORDER — METOCLOPRAMIDE HCL 5 MG PO TABS
5.0000 mg | ORAL_TABLET | Freq: Three times a day (TID) | ORAL | Status: DC | PRN
  Administered 2021-04-07 (×2): 10 mg via ORAL
  Filled 2021-04-07 (×2): qty 2

## 2021-04-07 MED ORDER — ORAL CARE MOUTH RINSE
15.0000 mL | Freq: Once | OROMUCOSAL | Status: AC
  Administered 2021-04-07: 15 mL via OROMUCOSAL

## 2021-04-07 MED ORDER — LACTATED RINGERS IV SOLN: INTRAVENOUS | Status: DC

## 2021-04-07 MED ORDER — PROPOFOL 500 MG/50ML IV EMUL
INTRAVENOUS | Status: DC | PRN
  Administered 2021-04-07: 50 ug/kg/min via INTRAVENOUS

## 2021-04-07 MED ORDER — ALUM & MAG HYDROXIDE-SIMETH 200-200-20 MG/5ML PO SUSP: 30.0000 mL | ORAL | Status: DC | PRN

## 2021-04-07 SURGICAL SUPPLY — 65 items
APL SKNCLS STERI-STRIP NONHPOA (GAUZE/BANDAGES/DRESSINGS)
BAG COUNTER SPONGE SURGICOUNT (BAG) IMPLANT
BAG SPEC THK2 15X12 ZIP CLS (MISCELLANEOUS) ×1
BAG SPNG CNTER NS LX DISP (BAG)
BAG ZIPLOCK 12X15 (MISCELLANEOUS) ×2 IMPLANT
BENZOIN TINCTURE PRP APPL 2/3 (GAUZE/BANDAGES/DRESSINGS) IMPLANT
BLADE SAG 18X100X1.27 (BLADE) ×2 IMPLANT
BLADE SURG SZ10 CARB STEEL (BLADE) ×4 IMPLANT
BNDG CMPR 82X61 PLY HI ABS (GAUZE/BANDAGES/DRESSINGS) ×1
BNDG CONFORM 6X.82 1P STRL (GAUZE/BANDAGES/DRESSINGS) ×1 IMPLANT
BNDG ELASTIC 6X5.8 VLCR STR LF (GAUZE/BANDAGES/DRESSINGS) ×4 IMPLANT
BOWL SMART MIX CTS (DISPOSABLE) IMPLANT
BSPLAT TIB 5D E CMNT KN RT (Knees) ×1 IMPLANT
CEMENT BONE R 1X40 (Cement) ×2 IMPLANT
COMP PATELLAR 29 STD 8 THK (Orthopedic Implant) IMPLANT
COOLER ICEMAN CLASSIC (MISCELLANEOUS) ×2 IMPLANT
COVER SURGICAL LIGHT HANDLE (MISCELLANEOUS) ×2 IMPLANT
CUFF TOURN SGL QUICK 34 (TOURNIQUET CUFF) ×2
CUFF TRNQT CYL 34X4.125X (TOURNIQUET CUFF) ×1 IMPLANT
DRAPE INCISE IOBAN 66X45 STRL (DRAPES) ×2 IMPLANT
DRAPE U-SHAPE 47X51 STRL (DRAPES) ×2 IMPLANT
DRSG PAD ABDOMINAL 8X10 ST (GAUZE/BANDAGES/DRESSINGS) ×3 IMPLANT
DURAPREP 26ML APPLICATOR (WOUND CARE) ×2 IMPLANT
ELECT BLADE TIP CTD 4 INCH (ELECTRODE) ×2 IMPLANT
ELECT REM PT RETURN 15FT ADLT (MISCELLANEOUS) ×2 IMPLANT
FEMUR CMT CR STD SZ 6 RT KNEE (Joint) ×2 IMPLANT
FEMUR CMTD CR STD SZ 6 RT KNEE (Joint) IMPLANT
GAUZE SPONGE 4X4 12PLY STRL (GAUZE/BANDAGES/DRESSINGS) ×2 IMPLANT
GAUZE XEROFORM 1X8 LF (GAUZE/BANDAGES/DRESSINGS) IMPLANT
GLOVE SRG 8 PF TXTR STRL LF DI (GLOVE) ×2 IMPLANT
GLOVE SURG ENC MOIS LTX SZ7.5 (GLOVE) ×2 IMPLANT
GLOVE SURG NEOPR MICRO LF SZ8 (GLOVE) ×2 IMPLANT
GLOVE SURG UNDER POLY LF SZ8 (GLOVE) ×4
GOWN STRL REUS W/ TWL XL LVL3 (GOWN DISPOSABLE) ×2 IMPLANT
GOWN STRL REUS W/TWL XL LVL3 (GOWN DISPOSABLE) ×4
HANDPIECE INTERPULSE COAX TIP (DISPOSABLE) ×2
HDLS TROCR DRIL PIN KNEE 75 (PIN) ×2
HOLDER FOLEY CATH W/STRAP (MISCELLANEOUS) IMPLANT
IMMOBILIZER KNEE 20 (SOFTGOODS) ×2
IMMOBILIZER KNEE 20 THIGH 36 (SOFTGOODS) ×1 IMPLANT
INSERT TIB AS PERS 6-7X13 RT (Insert) ×1 IMPLANT
KIT TURNOVER KIT A (KITS) IMPLANT
NS IRRIG 1000ML POUR BTL (IV SOLUTION) ×2 IMPLANT
PACK TOTAL KNEE CUSTOM (KITS) ×2 IMPLANT
PAD COLD SHLDR WRAP-ON (PAD) ×2 IMPLANT
PADDING CAST COTTON 6X4 STRL (CAST SUPPLIES) ×4 IMPLANT
PATELLA ZIMMER 29MM (Orthopedic Implant) ×2 IMPLANT
PIN DRILL HDLS TROCAR 75 4PK (PIN) IMPLANT
PROTECTOR NERVE ULNAR (MISCELLANEOUS) ×2 IMPLANT
SCREW FEMALE HEX FIX 25X2.5 (ORTHOPEDIC DISPOSABLE SUPPLIES) ×1 IMPLANT
SET HNDPC FAN SPRY TIP SCT (DISPOSABLE) ×1 IMPLANT
SET PAD KNEE POSITIONER (MISCELLANEOUS) ×2 IMPLANT
SPIKE FLUID TRANSFER (MISCELLANEOUS) IMPLANT
STAPLER VISISTAT 35W (STAPLE) IMPLANT
STEM TIBIA 5 DEG SZ E R KNEE (Knees) IMPLANT
STRIP CLOSURE SKIN 1/2X4 (GAUZE/BANDAGES/DRESSINGS) IMPLANT
SUT MNCRL AB 4-0 PS2 18 (SUTURE) IMPLANT
SUT VIC AB 0 CT1 27 (SUTURE) ×2
SUT VIC AB 0 CT1 27XBRD ANTBC (SUTURE) ×1 IMPLANT
SUT VIC AB 1 CT1 36 (SUTURE) ×4 IMPLANT
SUT VIC AB 2-0 CT1 27 (SUTURE) ×4
SUT VIC AB 2-0 CT1 TAPERPNT 27 (SUTURE) ×2 IMPLANT
TIBIA STEM 5 DEG SZ E R KNEE (Knees) ×2 IMPLANT
TRAY FOLEY MTR SLVR 16FR STAT (SET/KITS/TRAYS/PACK) IMPLANT
WATER STERILE IRR 1000ML POUR (IV SOLUTION) ×4 IMPLANT

## 2021-04-07 NOTE — Anesthesia Procedure Notes (Signed)
Spinal ? ?Patient location during procedure: OR ?Start time: 04/07/2021 12:47 PM ?Reason for block: surgical anesthesia ?Staffing ?Performed: anesthesiologist  ?Anesthesiologist: Janeece Riggers, MD ?Resident/CRNA: [place] Zacharia Sowles C, CRNA ?Preanesthetic Checklist ?Completed: patient identified, IV checked, site marked, risks and benefits discussed, surgical consent, monitors and equipment checked, pre-op evaluation and timeout performed ?Spinal Block ?Patient position: sitting ?Prep: DuraPrep ?Patient monitoring: heart rate, cardiac monitor, continuous pulse ox and blood pressure ?Approach: midline ?Location: L2-3 ?Injection technique: single-shot ?Needle ?Needle type: Qu[place]ke  ?Needle gauge: 22 G ?Needle length: 9 cm ?Assessment ?Sensory level: T4 ?Events: CSF return ?Additional Notes ?IV functioning, monitors applied to pt. Expiration date of kit checked and confirmed to be in date. Sterile prep and drape, hand hygiene and sterile gloved used. Pt was positioned and spine was prepped in sterile fashion. Skin was anesthetized with lidocaine. Free flow of clear CSF obtained prior to injecting local anesthetic into CSF x several attempts by CRNA then MDA.Marland Kitchen Spinal needle aspirated freely following injection. Needle was carefully withdrawn, and pt tolerated procedure well. Loss of motor and sensory on exam post injection.  ? ? ? ?

## 2021-04-07 NOTE — Anesthesia Postprocedure Evaluation (Signed)
Anesthesia Post Note ? ?[name] ? ?Procedure(s) Performed: Right TOTAL KNEE ARTHROPLASTY (Right: Knee) ? ?  ? ?Patient location during evaluation: PACU ?Anesthesia Type: Regional and Spinal ?Level of consciousness: awake and alert ?Pain management: pain level controlled ?Vital Signs Assessment: post-procedure vital signs reviewed and stable ?Respiratory status: spontaneous breathing, nonlabored ventilation, respiratory function stable and patient connected to nasal cannula oxygen ?Cardiovascular status: blood pressure returned to baseline and stable ?Postop Assessment: no apparent nausea or vomiting ?Anesthetic complications: no ? ? ?No notable events documented. ? ?Last Vitals:  ?Vitals:  ? 04/07/21 1545 04/07/21 1616  ?BP: (!) 147/78 (!) 152/77  ?Pulse: 74 76  ?Resp: 14 16  ?Temp:  36.6 ?C  ?SpO2: 96% 98%  ?  ?Last Pain:  ?Vitals:  ? 04/07/21 1616  ?TempSrc: Oral  ?PainSc: 8   ? ? ?  ?  ?  ?  ?  ?  ? ?Sheneka Schrom ? ? ? ? ?

## 2021-04-07 NOTE — Transfer of Care (Signed)
Immediate Anesthesia Transfer of Care Note ? ?[name] ? ?Procedure(s) Performed: Right TOTAL KNEE ARTHROPLASTY (Right: Knee) ? ?Patient Location: PACU ? ?Anesthesia Type:Spinal ? ?Level of Consciousness: awake, alert  and oriented ? ?Airway & Oxygen Therapy: Patient Spontanous Breathing and Patient connected to face mask oxygen ? ?Post-op Assessment: Report given to RN and Post -op Vital signs reviewed and stable ? ?Post vital signs: Reviewed and stable ? ?Last Vitals:  ?Vitals Value Taken Time  ?BP    ?Temp    ?Pulse    ?Resp    ?SpO2    ? ? ?Last Pain:  ?Vitals:  ? 04/07/21 1034  ?TempSrc: Oral  ?   ? ?  ? ?Complications: No notable events documented. ?

## 2021-04-07 NOTE — Progress Notes (Signed)
Assisted Dr. Oddono with right, adductor canal, ultrasound guided block. Side rails up, monitors on throughout procedure. See vital signs in flow sheet. Tolerated Procedure well. 

## 2021-04-07 NOTE — Op Note (Signed)
NAME: Brandy Delgado, Brandy B. ?MEDICAL RECORD NO: [id number] ?ACCOUNT NO: [id number] ?DATE OF BIRTH: September 02, 1945 ?FACILITY: [facility] ?LOCATION: [facility] ?PHYSICIAN: Lind Guest. Ninfa Linden, MD ? ?Operative Report  ? ?DATE OF PROCEDURE: 04/07/2021 ? ?PREOPERATIVE DIAGNOSIS:  Primary osteoarthritis and degenerative joint disease, right knee. ? ?POSTOPERATIVE DIAGNOSIS:  Primary osteoarthritis and degenerative joint disease, right knee. ? ?PROCEDURE:  Cemented right total knee arthroplasty. ? ?IMPLANTS:  Biomet/Zimmer Persona knee system for right knee with size 6 standard femur, size E tibial tray, 13 mm medial congruent fixed bearing polyethylene insert, size 29 patellar button. ? ?SURGEON:  Lind Guest. Ninfa Linden, MD ? ?ASSISTANT:  Benita Stabile, PA-C ? ?ANESTHESIA:   ?1.  Right lower extremity adductor canal block. ?2.  Spinal. ?3.  Local with 0.25% Marcaine with epinephrine. ? ?TOURNIQUET TIME:  Under 1 hour. ? ?ANTIBIOTICS:  2 g IV Ancef. ? ?ESTIMATED BLOOD LOSS:  Less than 100 mL. ? ?COMPLICATIONS:  None. ? ?INDICATIONS:  The patient is a 76 year old female well known to me.  She has well documented debilitating arthritis involving her right knee.  We have tried and failed conservative treatment for a long period of time.  At this juncture, her right knee  ?pain is detrimentally affecting her mobility, her quality of life and activities of daily living to the point she does wish to proceed with a total knee arthroplasty.  We talked about the risk of acute blood loss anemia, nerve or vessel injury, fracture, ? infection, DVT, implant failure, skin and soft tissue issues.  We talked about our goals being decreased pain, improve mobility and overall improve quality of life. ? ?DESCRIPTION OF PROCEDURE:  After informed consent was obtained, appropriate right knee was marked and adductor canal block was obtained in the right lower extremity in the holding room.  She was then brought to the operating room and sat up on the  ?operating  table where spinal anesthesia was obtained.  She was then laid in supine position on the operating table. Foley catheter was placed and a nonsterile tourniquet was placed around her upper right thigh.  Her right thigh, knee, leg, and ankle were ? prepped and draped with DuraPrep and sterile drapes.  A timeout was called.  She was identified correct patient, correct right knee.  I then made an incision over the patella and carried this proximally and distally.  I dissected down the knee joint,  ?carried out a medial parapatellar arthrotomy, finding a moderate joint effusion and significant complete loss of cartilage throughout the knee. With the knee in a flexed position, we removed the remnants of ACL, PCL, medial and lateral meniscus.  We  ?placed our extramedullary cutting guide for making our proximal tibia cut, correcting it for varus and valgus and a 3-degree slope.  We made this to take 10 mm off the high side.  We made this cut without difficulty.  We then went to the femur and did a  ?distal femoral cut based on the epicondylar axis with an intramedullary guide for a right knee at 5 degrees externally rotated for a 10 mm distal femoral cut.  We then brought the knee back down to full extension and with a full extension, 10 mm  ?extension block, she slightly hyperextended.  We then went back to the femur and put our femoral sizing guide based off the epicondylar axis.  We chose a size 6 femur.  We put a 4-in-1 cutting block for a size 6 femur, made our anterior and posterior  ?cuts  followed by our chamfer cuts.  We then moved back to the tibia and chose a size E tibial right tray for coverage over the tibial tubercle, making our keel punch and drill hole off of this.  With a size E right trial tibia, we trialed a size 6  ?standard right femur and then the 13 mm medial congruent fixed bearing polyethylene insert trial.  We were pleased with range of motion and stability with this trial.  We then made our  patellar cut and drilled three holes for size 29, cemented patellar  ?button. With all trial instrumentations in the knee, we put her through several cycles of motion.  We were pleased with range of motion and stability.  We then removed all trial instrumentation from the knee and irrigated the knee with normal saline  ?solution.  We then placed our Marcaine with epinephrine around the arthrotomy.  We mixed our cement with the knee in a flexed position and dried the knee real well and then cemented our Biomet Zimmer Persona size E right tibial tray followed by our size  ?6 standard right femur.  We placed our 13 mm medial congruent fixed bearing polyethylene insert and cemented our patellar button, size 29.  We then held the knee fully extended and removed cement debris from the knee and then compressed the knee as well  ?while the cement hardened.  Once it had hardened, we let the tourniquet down.  Hemostasis was obtained with electrocautery.  We put the knee through several cycles of motion.  We were pleased with range of motion and stability.  We then closed the  ?arthrotomy with interrupted #1 Vicryl suture followed by 0 Vicryl for the deep tissue and 2-0 Vicryl to close the subcutaneous tissue.  The skin was closed with staples.  A well-padded sterile dressing was applied.  She was taken off the operating table  ?and taken to recovery room in stable condition with all final counts being correct.  No complications noted.  ? ?Of note, Benita Stabile, PA-C, was present for the entire case from beginning to end.  His assistance was crucial and medically necessary for soft tissue management and retraction of soft tissues, with helping position the implant and with doing the layered  ?closure of the wound. ? ? ?[facility] ?D: 04/07/2021 2:11:24 pm T: 04/07/2021 10:28:00 pm  ?JOB: [id number]/ [id number]  ?

## 2021-04-07 NOTE — Plan of Care (Signed)
  Problem: Nutrition: Goal: Adequate nutrition will be maintained Outcome: Progressing   Problem: Coping: Goal: Level of anxiety will decrease Outcome: Progressing   Problem: Elimination: Goal: Will not experience complications related to urinary retention Outcome: Progressing   Problem: Pain Managment: Goal: General experience of comfort will improve Outcome: Progressing   

## 2021-04-07 NOTE — Progress Notes (Signed)
PT Cancellation Note ? ?Patient Details ?Name: Brandy Delgado ?MRN: [id number] ?DOB: 1945-03-20 ? ? ?Cancelled Treatment:    Reason Eval/Treat Not Completed: Pain limiting ability to participate. Pt reporting high levels of pain as well as high levels of nausea. Pt and daughter reporting this is not typical. We will follow up as schedule allows.  ? ?Coolidge Breeze, PT, DPT ?[facility] ?Office: [phone] ?Pager: [phone] ? ?Coolidge Breeze ?04/07/2021, 5:37 PM ?

## 2021-04-07 NOTE — Plan of Care (Signed)
  Problem: Clinical Measurements: Goal: Ability to maintain clinical measurements within normal limits will improve Outcome: Progressing   Problem: Activity: Goal: Risk for activity intolerance will decrease Outcome: Progressing   Problem: Pain Managment: Goal: General experience of comfort will improve Outcome: Progressing   Problem: Safety: Goal: Ability to remain free from injury will improve Outcome: Progressing   

## 2021-04-07 NOTE — Anesthesia Procedure Notes (Signed)
Anesthesia Regional Block: Adductor canal block  ? ?Pre-Anesthetic Checklist: , timeout performed,  Correct Patient, Correct Site, Correct Laterality,  Correct Procedure, Correct Position, site marked,  Risks and benefits discussed,  Surgical consent,  Pre-op evaluation,  At surgeon's request and post-op pain management ? ?Laterality: Right ? ?Prep: chloraprep     ?  ?Needles:  ?Injection technique: Single-shot ? ?Needle Type: Echogenic Stimulator Needle   ? ? ?Needle Length: 5cm  ?Needle Gauge: 22  ? ? ? ?Additional Needles: ? ? ?Procedures:, nerve stimulator,,, ultrasound used (permanent image in chart),,    ?Narrative:  ?Start time: 04/07/2021 12:10 PM ?End time: 04/07/2021 12:15 PM ?Injection made incrementally with aspirations every 5 mL. ? ?Performed by: Personally  ?Anesthesiologist: Janeece Riggers, MD ? ?Additional Notes: ?Functioning IV was confirmed and monitors were applied.  A 56m 22ga Arrow echogenic stimulator needle was used. Sterile prep and drape,hand hygiene and sterile gloves were used. Ultrasound guidance: relevant anatomy identified, needle position confirmed, local anesthetic spread visualized around nerve(s)., vascular puncture avoided.  Image printed for medical record. Negative aspiration and negative test dose prior to incremental administration of local anesthetic. The patient tolerated the procedure well. ? ? ? ? ? ?

## 2021-04-07 NOTE — Brief Op Note (Signed)
04/07/2021 ? ?2:15 PM ? ?PATIENT:  KAMMIE SCIOLI  76 y.o. female ? ?PRE-OPERATIVE DIAGNOSIS:  Osteoarthritis / Degenerative Joint Diease Right Knee ? ?POST-OPERATIVE DIAGNOSIS:  Osteoarthritis / Degenerative Joint Diease Right Knee ? ?PROCEDURE:  Procedure(s): ?Right TOTAL KNEE ARTHROPLASTY (Right) ? ?SURGEON:  Surgeon(s) and Role: ?   [name], MD - Primary ? ?PHYSICIAN ASSISTANT:  Benita Stabile, PA-C ? ?ANESTHESIA:   local, regional, and spinal ? ?COUNTS:  YES ? ?TOURNIQUET:   ?Total Tourniquet Time Documented: ?Thigh (Right) - 47 minutes ?Total: Thigh (Right) - 47 minutes ? ? ?DICTATION: .Other Dictation: Dictation Number [id number] ? ?PLAN OF CARE: Admit for overnight observation ? ?PATIENT DISPOSITION:  PACU - hemodynamically stable. ?  ?Delay start of Pharmacological VTE agent (>24hrs) due to surgical blood loss or risk of bleeding: no ? ?

## 2021-04-07 NOTE — Anesthesia Preprocedure Evaluation (Addendum)
Anesthesia Evaluation  ?Patient identified by MRN, date of birth, ID band ?Patient awake ? ? ? ?Reviewed: ?Allergy & Precautions, H&P , NPO status , Patient's Chart, lab work & pertinent test results, reviewed documented beta blocker date and time  ? ?Airway ?Mallampati: II ? ?TM Distance: >3 FB ?Neck ROM: full ? ? ? Dental ?no notable dental hx. ?(+) Teeth Intact, Dental Advisory Given, Caps ?  ?Pulmonary ?neg pulmonary ROS,  ?  ?Pulmonary exam normal ?breath sounds clear to auscultation ? ? ? ? ? ? Cardiovascular ?Exercise Tolerance: Good ?negative cardio ROS ? ? ?Rhythm:regular Rate:Normal ? ? ?  ?Neuro/Psych ?negative neurological ROS ? negative psych ROS  ? GI/Hepatic ?negative GI ROS, Neg liver ROS,   ?Endo/Other  ?Hypothyroidism  ? Renal/GU ?negative Renal ROS  ?negative genitourinary ?  ?Musculoskeletal ? ?(+) Arthritis ,  ? Abdominal ?  ?Peds ? Hematology ?negative hematology ROS ?(+)   ?Anesthesia Other Findings ? ? Reproductive/Obstetrics ?negative OB ROS ? ?  ? ? ? ? ? ? ? ? ? ? ? ? ? ?  ?  ? ? ? ? ? ? ? ?Anesthesia Physical ?Anesthesia Plan ? ?ASA: 2 ? ?Anesthesia Plan: MAC, Spinal and Regional  ? ?Post-op Pain Management: Regional block* and Minimal or no pain anticipated  ? ?Induction: Intravenous ? ?PONV Risk Score and Plan: 2 ? ?Airway Management Planned: Natural Airway, Nasal Cannula and Simple Face Mask ? ?Additional Equipment: None ? ?Intra-op Plan:  ? ?Post-operative Plan:  ? ?Informed Consent: I have reviewed the patients History and Physical, chart, labs and discussed the procedure including the risks, benefits and alternatives for the proposed anesthesia with the patient or authorized representative who has indicated his/her understanding and acceptance.  ? ? ? ?Dental Advisory Given ? ?Plan Discussed with: CRNA and Anesthesiologist ? ?Anesthesia Plan Comments:   ? ? ? ? ? ? ?Anesthesia Quick Evaluation ? ?

## 2021-04-07 NOTE — Interval H&P Note (Signed)
History and Physical Interval Note: The patient understands that she is here today for a right total knee replacement to treat her right knee osteoarthritis.  There has been no acute or interval change in her medical status.  Please see recent H&P.  The risks and benefits of surgery been described in detail and informed consent is obtained. ? ?04/07/2021 ?11:30 AM ? ?Brandy Delgado  has presented today for surgery, with the diagnosis of Osteoarthritis / Degenerative Joint Diease Right Knee.  The various methods of treatment have been discussed with the patient and family. After consideration of risks, benefits and other options for treatment, the patient has consented to  Procedure(s): ?Right TOTAL KNEE ARTHROPLASTY (Right) as a surgical intervention.  The patient's history has been reviewed, patient examined, no change in status, stable for surgery.  I have reviewed the patient's chart and labs.  Questions were answered to the patient's satisfaction.   ? ? ?[name] ? ? ?

## 2021-04-08 DIAGNOSIS — M1711 Unilateral primary osteoarthritis, right knee: Secondary | ICD-10-CM | POA: Diagnosis not present

## 2021-04-08 LAB — BASIC METABOLIC PANEL
Anion gap: 7 (ref 5–15)
BUN: 14 mg/dL (ref 8–23)
CO2: 24 mmol/L (ref 22–32)
Calcium: 8.4 mg/dL — ABNORMAL LOW (ref 8.9–10.3)
Chloride: 101 mmol/L (ref 98–111)
Creatinine, Ser: 0.59 mg/dL (ref 0.44–1.00)
GFR, Estimated: >60 mL/min (ref >60)
Glucose, Bld: 177 mg/dL — ABNORMAL HIGH (ref 70–99)
Potassium: 3.9 mmol/L (ref 3.5–5.1)
Sodium: 132 mmol/L — ABNORMAL LOW (ref 135–145)

## 2021-04-08 LAB — CBC
HCT: 33.6 % — ABNORMAL LOW (ref 36.0–46.0)
Hemoglobin: 10.5 g/dL — ABNORMAL LOW (ref 12.0–15.0)
MCH: 27.3 pg (ref 26.0–34.0)
MCHC: 31.3 g/dL (ref 30.0–36.0)
MCV: 87.5 fL (ref 80.0–100.0)
Platelets: 314 10*3/uL (ref 150–400)
RBC: 3.84 MIL/uL — ABNORMAL LOW (ref 3.87–5.11)
RDW: 14.5 % (ref 11.5–15.5)
WBC: 17 10*3/uL — ABNORMAL HIGH (ref 4.0–10.5)
nRBC: 0 % (ref 0.0–0.2)

## 2021-04-08 MED ORDER — KETOROLAC TROMETHAMINE 15 MG/ML IJ SOLN
7.5000 mg | Freq: Four times a day (QID) | INTRAMUSCULAR | Status: AC
  Administered 2021-04-08 (×3): 7.5 mg via INTRAVENOUS
  Filled 2021-04-08 (×3): qty 1

## 2021-04-08 NOTE — Progress Notes (Signed)
Physical Therapy Treatment ?Patient Details ?Name: Brandy Delgado ?MRN: [id number] ?DOB: Apr 09, 1945 ?Today's Date: 04/08/2021 ? ? ?History of Present Illness Pt is a 76yo female s/p R-TKA on 04/07/21. PMH: OA, hypothyroidism, bil shoulder pain, falls ? ?  ?PT Comments  ? ? Progressing slowly with mobility. Able to walk ~25 feet this afternoon. Pain better controlled. Will continue to progress activity as tolerated.    ?Recommendations for follow up therapy are one component of a multi-disciplinary discharge planning process, led by the attending physician.  Recommendations may be updated based on patient status, additional functional criteria and insurance authorization. ? ?Follow Up Recommendations ? Follow physician's recommendations for discharge plan and follow up therapies ?  ?  ?Assistance Recommended at Discharge Frequent or constant Supervision/Assistance  ?Patient can return home with the following Assistance with cooking/housework;Assist for transportation;Help with stairs or ramp for entrance;A little help with walking and/or transfers;A little help with bathing/dressing/bathroom ?  ?Equipment Recommendations ? Rolling walker (2 wheels);BSC/3in1  ?  ?Recommendations for Other Services   ? ? ?  ?Precautions / Restrictions Precautions ?Precautions: Fall;Knee ?Required Braces or Orthoses: Knee Immobilizer - Right ?Knee Immobilizer - Right: Discontinue once straight leg raise with < 10 degree lag ?Restrictions ?Weight Bearing Restrictions: No ?Other Position/Activity Restrictions: WBAT  ?  ? ?Mobility ? Bed Mobility ?Overal bed mobility: Needs Assistance ?Bed Mobility: Supine to Sit, Sit to Supine ?  ?  ?Supine to sit: Min assist, HOB elevated ?Sit to supine: Mod assist, HOB elevated ?  ?General bed mobility comments: Assist for trunk and bil LEs. Increased time. Cues for safety, technique. ?  ? ?Transfers ?Overall transfer level: Needs assistance ?Equipment used: Rolling walker (2 wheels) ?Transfers: Sit to/from  Stand ?Sit to Stand: Min assist, From elevated surface ?  ?  ?  ?  ?  ?General transfer comment: Assist to rise, steady, position R LE, control descent. Cues for safety, technique, hand/LE placement. Increased time. ?  ? ?Ambulation/Gait ?Ambulation/Gait assistance: Min assist ?Gait Distance (Feet): 25 Feet (25'x1; 15'x1) ?Assistive device: Rolling walker (2 wheels) ?Gait Pattern/deviations: Step-to pattern ?  ?  ?  ?General Gait Details: Cues for safety, technique, sequence, proper use of RW. Assist to stabilize pt throughout distance. ? ? ?Stairs ?  ?  ?  ?  ?  ? ? ?Wheelchair Mobility ?  ? ?Modified Rankin (Stroke Patients Only) ?  ? ? ?  ?Balance Overall balance assessment: Needs assistance, History of Falls ?  ?  ?  ?  ?Standing balance support: Bilateral upper extremity supported, During functional activity, Reliant on assistive device for balance ?Standing balance-Leahy Scale: Poor ?  ?  ?  ?  ?  ?  ?  ?  ?  ?  ?  ?  ?  ? ?  ?Cognition Arousal/Alertness: Awake/alert ?Behavior During Therapy: [facility] for tasks assessed/performed ?Overall Cognitive Status: Within Functional Limits for tasks assessed ?  ?  ?  ?  ?  ?  ?  ?  ?  ?  ?  ?  ?  ?  ?  ?  ?  ?  ?  ? ?  ?Exercises Total Joint Exercises ?Ankle Circles/Pumps: AROM, Both, 10 reps ?Quad Sets: AROM, Both, 10 reps ?Heel Slides: AAROM, Right, 10 reps ?Hip ABduction/ADduction: AAROM, Right, 10 reps ?Straight Leg Raises: AAROM, Right, 5 reps (limited reps 2* back pain; maybe try with completely flat bed) ?Goniometric ROM: ~10-55 degrees ? ?  ?General Comments   ?  ?  ? ?  Pertinent Vitals/Pain Pain Assessment ?Pain Assessment: 0-10 ?Pain Score: 6  ?Pain Location: R knee ?Pain Descriptors / Indicators: Discomfort, Sore ?Pain Intervention(s): Limited activity within patient's tolerance, Monitored during session, Repositioned  ? ? ?Home Living   ?  ?  ?  ?  ?  ?  ?  ?  ?  ?   ?  ?Prior Function    ?  ?  ?   ? ?PT Goals (current goals can now be found in the care plan  section) Acute Rehab PT Goals ?Patient Stated Goal: less pain ?PT Goal Formulation: With patient/family ?Time For Goal Achievement: 04/22/21 ?Potential to Achieve Goals: Good ?Progress towards PT goals: Progressing toward goals ? ?  ?Frequency ? ? ? 7X/week ? ? ? ?  ?PT Plan Current plan remains appropriate  ? ? ?Co-evaluation   ?  ?  ?  ?  ? ?  ?AM-PAC PT "6 Clicks" Mobility   ?Outcome Measure ? Help needed turning from your back to your side while in a flat bed without using bedrails?: A Little ?Help needed moving from lying on your back to sitting on the side of a flat bed without using bedrails?: A Little ?Help needed moving to and from a bed to a chair (including a wheelchair)?: A Little ?Help needed standing up from a chair using your arms (e.g., wheelchair or bedside chair)?: A Lot ?Help needed to walk in hospital room?: A Little ?Help needed climbing 3-5 steps with a railing? : Total ?6 Click Score: 15 ? ?  ?End of Session Equipment Utilized During Treatment: Gait belt ?Activity Tolerance: Patient limited by fatigue;Patient limited by pain ?Patient left: in bed;with call bell/phone within reach;with family/visitor present;with bed alarm set ?  ?PT Visit Diagnosis: Pain;Other abnormalities of gait and mobility (R26.89);History of falling (Z91.81) ?Pain - Right/Left: Right ?Pain - part of body: Knee ?  ? ? ?Time: 0315-9458 ?PT Time Calculation (min) (ACUTE ONLY): 22 min ? ?Charges:  $Gait Training: 8-22 mins          ?          ? ? ? ? ? ?Doreatha Massed, PT ?Acute Rehabilitation  ?Office: [phone] ?Pager: [phone] ? ?  ? ?

## 2021-04-08 NOTE — Evaluation (Signed)
Physical Therapy Evaluation ?Patient Details ?Name: Brandy Delgado ?MRN: [id number] ?DOB: [birth year] ?Today's Date: 04/08/2021 ? ?History of Present Illness ? Pt is a 76yo female s/p R-TKA on 04/07/21. PMH: OA, hypothyroidism, bil shoulder pain, falls  ?Clinical Impression ? On eval, pt required Mod A for mobility. She walked ~15 feet x 2 with a RW. Distance limited by nausea, dizziness, and fatigue. Moderate pain during session-pt still falls asleep easily. Will follow and progress activity as tolerated.    ?   ? ?Recommendations for follow up therapy are one component of a multi-disciplinary discharge planning process, led by the attending physician.  Recommendations may be updated based on patient status, additional functional criteria and insurance authorization. ? ?Follow Up Recommendations Follow physician's recommendations for discharge plan and follow up therapies ? ?  ?Assistance Recommended at Discharge Frequent or constant Supervision/Assistance  ?Patient can return home with the following ? Assistance with cooking/housework;Assist for transportation;Help with stairs or ramp for entrance;A little help with walking and/or transfers;A little help with bathing/dressing/bathroom ? ?  ?Equipment Recommendations Rolling walker (2 wheels);BSC/3in1  ?Recommendations for Other Services ?    ?  ?Functional Status Assessment Patient has had a recent decline in their functional status and demonstrates the ability to make significant improvements in function in a reasonable and predictable amount of time.  ? ?  ?Precautions / Restrictions Precautions ?Precautions: Fall;Knee ?Required Braces or Orthoses: Knee Immobilizer - Right ?Knee Immobilizer - Right: Discontinue once straight leg raise with < 10 degree lag ?Restrictions ?Weight Bearing Restrictions: No ?Other Position/Activity Restrictions: WBAT  ? ?  ? ?Mobility ? Bed Mobility ?Overal bed mobility: Needs Assistance ?Bed Mobility: Sit to Supine ?  ?  ?  ?Sit to supine:  Mod assist, HOB elevated ?  ?General bed mobility comments: Assist for bil LEs. Increased time. Cues for safety, technique. ?  ? ?Transfers ?Overall transfer level: Needs assistance ?Equipment used: Rolling walker (2 wheels) ?Transfers: Sit to/from Stand ?Sit to Stand: Mod assist ?  ?  ?  ?  ?  ?General transfer comment: Assist to rise, steady, position R LE, control descent. Cues for safety, technique, hand/LE placement. Increased time. ?  ? ?Ambulation/Gait ?Ambulation/Gait assistance: Min assist ?Gait Distance (Feet): 15 Feet (x2) ?Assistive device: Rolling walker (2 wheels) ?Gait Pattern/deviations: Step-to pattern ?  ?  ?  ?General Gait Details: Cues for safety, technique, sequence, proper use of RW. Assist to stabilize pt throughout distance. Distance limited by nausea, dizziness, fatigue. ? ?Stairs ?  ?  ?  ?  ?  ? ?Wheelchair Mobility ?  ? ?Modified Rankin (Stroke Patients Only) ?  ? ?  ? ?Balance Overall balance assessment: Needs assistance, History of Falls ?  ?  ?  ?  ?Standing balance support: Bilateral upper extremity supported, During functional activity, Reliant on assistive device for balance ?Standing balance-Leahy Scale: Poor ?  ?  ?  ?  ?  ?  ?  ?  ?  ?  ?  ?  ?   ? ? ? ?Pertinent Vitals/Pain Pain Assessment ?Pain Assessment: 0-10 ?Pain Score: 7  ?Pain Location: R knee ?Pain Descriptors / Indicators: Discomfort, Sore ?Pain Intervention(s): Limited activity within patient's tolerance, Monitored during session, Repositioned, Ice applied  ? ? ?Home Living Family/patient expects to be discharged to:: Private residence ?Living Arrangements: Alone ?Available Help at Discharge: Family ?Type of Home: House ?Home Access: Ramped entrance ?  ?  ?  ?Home Layout: One level ?Home Equipment: None ?   ?  ?  Prior Function Prior Level of Function : Independent/Modified Independent ?  ?  ?  ?  ?  ?  ?  ?  ?  ? ? ?Hand Dominance  ?   ? ?  ?Extremity/Trunk Assessment  ? Upper Extremity Assessment ?Upper Extremity  Assessment: Generalized weakness ?  ? ?Lower Extremity Assessment ?Lower Extremity Assessment: Generalized weakness ?  ? ?Cervical / Trunk Assessment ?Cervical / Trunk Assessment: Normal  ?Communication  ? Communication: No difficulties  ?Cognition Arousal/Alertness: Awake/alert ?Behavior During Therapy: [facility] for tasks assessed/performed ?Overall Cognitive Status: Within Functional Limits for tasks assessed ?  ?  ?  ?  ?  ?  ?  ?  ?  ?  ?  ?  ?  ?  ?  ?  ?  ?  ?  ? ?  ?General Comments   ? ?  ?Exercises Total Joint Exercises ?Ankle Circles/Pumps: AROM, Both, 10 reps ?Quad Sets: AROM, Both, 10 reps ?Heel Slides: AAROM, Right, 10 reps ?Hip ABduction/ADduction: AAROM, Right, 10 reps ?Straight Leg Raises: AAROM, Right, 5 reps (limited reps 2* back pain; maybe try with completely flat bed) ?Goniometric ROM: ~10-55 degrees  ? ?Assessment/Plan  ?  ?PT Assessment Patient needs continued PT services  ?PT Problem List Decreased strength;Decreased mobility;Decreased range of motion;Decreased activity tolerance;Decreased balance;Decreased knowledge of use of DME;Pain ? ?   ?  ?PT Treatment Interventions DME instruction;Gait training;Therapeutic exercise;Balance training;Stair training;Functional mobility training;Therapeutic activities;Patient/family education   ? ?PT Goals (Current goals can be found in the Care Plan section)  ?Acute Rehab PT Goals ?Patient Stated Goal: less pain ?PT Goal Formulation: With patient/family ?Time For Goal Achievement: 04/22/21 ?Potential to Achieve Goals: Good ? ?  ?Frequency 7X/week ?  ? ? ?Co-evaluation   ?  ?  ?  ?  ? ? ?  ?AM-PAC PT "6 Clicks" Mobility  ?Outcome Measure Help needed turning from your back to your side while in a flat bed without using bedrails?: A Little ?Help needed moving from lying on your back to sitting on the side of a flat bed without using bedrails?: A Lot ?Help needed moving to and from a bed to a chair (including a wheelchair)?: A Little ?Help needed standing up from  a chair using your arms (e.g., wheelchair or bedside chair)?: A Lot ?Help needed to walk in hospital room?: A Lot ?Help needed climbing 3-5 steps with a railing? : Total ?6 Click Score: 13 ? ?  ?End of Session Equipment Utilized During Treatment: Gait belt ?Activity Tolerance: Patient limited by fatigue;Patient limited by pain ?Patient left: in bed;with call bell/phone within reach;with family/visitor present;with bed alarm set ?  ?PT Visit Diagnosis: Pain;Other abnormalities of gait and mobility (R26.89);History of falling (Z91.81) ?Pain - Right/Left: Right ?Pain - part of body: Knee ?  ? ?Time: 7939-0300 ?PT Time Calculation (min) (ACUTE ONLY): 35 min ? ? ?Charges:   PT Evaluation ?$PT Eval Low Complexity: 1 Low ?PT Treatments ?$Gait Training: 8-22 mins ?  ?   ? ? ? ? ? ?Doreatha Massed, PT ?Acute Rehabilitation  ?Office: [phone] ?Pager: [phone] ? ?  ? ?

## 2021-04-08 NOTE — Progress Notes (Signed)
Subjective: ?1 Day Post-Op Procedure(s) (LRB): ?Right TOTAL KNEE ARTHROPLASTY (Right) ?Patient reports pain as moderate.  Mobility limited by pain and nausea. ? ?Objective: ?Vital signs in last 24 hours: ?Temp:  [97.5 ?F (36.4 ?C)-98.9 ?F (37.2 ?C)] 97.7 ?F (36.5 ?C) (03/25 0451) ?Pulse Rate:  [68-94] 77 (03/25 0451) ?Resp:  [8-24] 17 (03/25 0451) ?BP: (117-162)/(63-89) 144/68 (03/25 0451) ?SpO2:  [88 %-100 %] 92 % (03/25 0451) ?Weight:  [88.6 kg] 88.6 kg (03/24 1616) ? ?Intake/Output from previous day: ?03/24 0701 - 03/25 0700 ?In: 2022.4 [I.V.:1772.4; IV Piggyback:250] ?Out: 1200 [Urine:1100; Blood:100] ?Intake/Output this shift: ?Total I/O ?In: 363.8 [P.O.:240; I.V.:123.8] ?Out: -  ? ?Recent Labs  ?  04/08/21 ?0318  ?HGB 10.5*  ? ?Recent Labs  ?  04/08/21 ?0318  ?WBC 17.0*  ?RBC 3.84*  ?HCT 33.6*  ?PLT 314  ? ?Recent Labs  ?  04/08/21 ?0318  ?NA 132*  ?K 3.9  ?CL 101  ?CO2 24  ?BUN 14  ?CREATININE 0.59  ?GLUCOSE 177*  ?CALCIUM 8.4*  ? ?No results for input(s): LABPT, INR in the last 72 hours. ? ?Sensation intact distally ?Intact pulses distally ?Dorsiflexion/Plantar flexion intact ?Incision: dressing C/D/I ?No cellulitis present ?Compartment soft ? ? ?Assessment/Plan: ?1 Day Post-Op Procedure(s) (LRB): ?Right TOTAL KNEE ARTHROPLASTY (Right) ?Up with therapy ?Will try toradol for pain today. ?Not safe for discharge to home today. ? ? ? ? ?[name] ?04/08/2021, 8:43 AM ? ?

## 2021-04-08 NOTE — Discharge Instructions (Signed)

## 2021-04-09 DIAGNOSIS — M1711 Unilateral primary osteoarthritis, right knee: Secondary | ICD-10-CM | POA: Diagnosis not present

## 2021-04-09 MED ORDER — ACETAMINOPHEN 325 MG PO TABS: 325.0000 mg | ORAL_TABLET | Freq: Four times a day (QID) | ORAL | 0 refills | Status: AC | PRN

## 2021-04-09 MED ORDER — ONDANSETRON HCL 4 MG PO TABS
4.0000 mg | ORAL_TABLET | Freq: Four times a day (QID) | ORAL | 0 refills | Status: AC | PRN
Start: 2021-04-09 — End: ?

## 2021-04-09 MED ORDER — METHOCARBAMOL 500 MG PO TABS
500.0000 mg | ORAL_TABLET | Freq: Four times a day (QID) | ORAL | 1 refills | Status: AC | PRN
Start: 2021-04-09 — End: ?

## 2021-04-09 MED ORDER — ASPIRIN 81 MG PO CHEW: 81.0000 mg | CHEWABLE_TABLET | Freq: Two times a day (BID) | ORAL | 0 refills | Status: AC

## 2021-04-09 NOTE — Progress Notes (Signed)
Physical Therapy Treatment ?Patient Details ?Name: Brandy Delgado ?MRN: [id number] ?DOB: [birth year] ?Today's Date: 04/09/2021 ? ? ?History of Present Illness Pt is a 76yo female s/p R-TKA on 04/07/21. PMH: OA, hypothyroidism, bil shoulder pain, falls ? ?  ?PT Comments  ? ? Pt continues to participate well. All education completed.    ?Recommendations for follow up therapy are one component of a multi-disciplinary discharge planning process, led by the attending physician.  Recommendations may be updated based on patient status, additional functional criteria and insurance authorization. ? ?Follow Up Recommendations ? Follow physician's recommendations for discharge plan and follow up therapies ?  ?  ?Assistance Recommended at Discharge Frequent or constant Supervision/Assistance  ?Patient can return home with the following Assistance with cooking/housework;Assist for transportation;Help with stairs or ramp for entrance;A little help with walking and/or transfers;A little help with bathing/dressing/bathroom ?  ?Equipment Recommendations ?    ?  ?Recommendations for Other Services   ? ? ?  ?Precautions / Restrictions Precautions ?Precautions: Fall;Knee ?Restrictions ?Weight Bearing Restrictions: No ?RLE Weight Bearing: Weight bearing as tolerated  ?  ? ?Mobility ? Bed Mobility ?Overal bed mobility: Needs Assistance ?Bed Mobility: Sit to Supine ?  ?  ?  ?Sit to supine: Mod assist ?  ?General bed mobility comments: oob in recliner ?  ? ?Transfers ?Overall transfer level: Needs assistance ?Equipment used: Rolling walker (2 wheels) ?Transfers: Sit to/from Stand ?Sit to Stand: Min guard ?  ?  ?  ?  ?  ?General transfer comment: Increased time. Cues for safety, technique, hand/LE placement. Min guard assist. ?  ? ?Ambulation/Gait ?Ambulation/Gait assistance: Min guard ?Gait Distance (Feet): 70 Feet ?Assistive device: Rolling walker (2 wheels) ?Gait Pattern/deviations: Step-to pattern ?  ?  ?  ?General Gait Details: Min guard for  safety. Pt tolerated distance well. ? ? ?Stairs ?  ?  ?  ?  ?  ? ? ?Wheelchair Mobility ?  ? ?Modified Rankin (Stroke Patients Only) ?  ? ? ?  ?Balance Overall balance assessment: Needs assistance ?  ?  ?  ?  ?Standing balance support: Bilateral upper extremity supported, During functional activity, Reliant on assistive device for balance ?Standing balance-Leahy Scale: Poor ?  ?  ?  ?  ?  ?  ?  ?  ?  ?  ?  ?  ?  ? ?  ?Cognition Arousal/Alertness: Awake/alert ?Behavior During Therapy: [facility] for tasks assessed/performed ?Overall Cognitive Status: Within Functional Limits for tasks assessed ?  ?  ?  ?  ?  ?  ?  ?  ?  ?  ?  ?  ?  ?  ?  ?  ?  ?  ?  ? ?  ?Exercises Total Joint Exercises ?Ankle Circles/Pumps: AROM, Both, 10 reps ?Quad Sets: AROM, Both, 10 reps ?Hip ABduction/ADduction: AAROM, Right, 10 reps ?Knee Flexion: AAROM, Right, 10 reps, Seated ?Goniometric ROM: ~10-65 degrees ? ?  ?General Comments   ?  ?  ? ?Pertinent Vitals/Pain Pain Assessment ?Pain Assessment: 0-10 ?Pain Score: 6  ?Pain Location: R knee ?Pain Descriptors / Indicators: Discomfort, Sore ?Pain Intervention(s): Limited activity within patient's tolerance, Monitored during session, Repositioned  ? ? ?Home Living   ?  ?  ?  ?  ?  ?  ?  ?  ?  ?   ?  ?Prior Function    ?  ?  ?   ? ?PT Goals (current goals can now be found in the care plan section) Progress  towards PT goals: Progressing toward goals ? ?  ?Frequency ? ? ? 7X/week ? ? ? ?  ?PT Plan Current plan remains appropriate  ? ? ?Co-evaluation   ?  ?  ?  ?  ? ?  ?AM-PAC PT "6 Clicks" Mobility   ?Outcome Measure ? Help needed turning from your back to your side while in a flat bed without using bedrails?: A Little ?Help needed moving from lying on your back to sitting on the side of a flat bed without using bedrails?: A Little ?Help needed moving to and from a bed to a chair (including a wheelchair)?: A Little ?Help needed standing up from a chair using your arms (e.g., wheelchair or bedside chair)?:  A Little ?Help needed to walk in hospital room?: A Little ?Help needed climbing 3-5 steps with a railing? : A Lot ?6 Click Score: 17 ? ?  ?End of Session Equipment Utilized During Treatment: Gait belt ?Activity Tolerance: Patient tolerated treatment well ?Patient left: in chair;with call bell/phone within reach;with family/visitor present ?  ?PT Visit Diagnosis: Pain;Other abnormalities of gait and mobility (R26.89);History of falling (Z91.81) ?Pain - Right/Left: Right ?Pain - part of body: Knee ?  ? ? ?Time: 3734-2876 ?PT Time Calculation (min) (ACUTE ONLY): 15 min ? ?Charges:  $Gait Training: 8-22 mins          ? ? ? ? ?Doreatha Massed, PT ?Acute Rehabilitation  ?Office: [phone] ?Pager: [phone] ? ?  ? ?

## 2021-04-09 NOTE — Plan of Care (Signed)
?  Problem: Clinical Measurements: ?Goal: Diagnostic test results will improve ?Outcome: Progressing ?  ?Problem: Clinical Measurements: ?Goal: Respiratory complications will improve ?Outcome: Progressing ?  ?Problem: Pain Managment: ?Goal: General experience of comfort will improve ?Outcome: Progressing ?  ?Problem: Safety: ?Goal: Ability to remain free from injury will improve ?Outcome: Progressing ?  ?Problem: Skin Integrity: ?Goal: Risk for impaired skin integrity will decrease ?Outcome: Progressing ?  ?

## 2021-04-09 NOTE — Progress Notes (Signed)
Subjective: ?2 Days Post-Op Procedure(s) (LRB): ?Right TOTAL KNEE ARTHROPLASTY (Right) ?Patient reports pain as mild to moderate.  Slow progress with PT. No nausea today.  ? ?Objective: ?Vital signs in last 24 hours: ?Temp:  [98 ?F (36.7 ?C)-98.2 ?F (36.8 ?C)] 98.2 ?F (36.8 ?C) (03/26 0358) ?Pulse Rate:  [85-100] 98 (03/26 0358) ?Resp:  [14-18] 16 (03/26 0358) ?BP: (132-151)/(60-68) 151/60 (03/26 0358) ?SpO2:  [91 %-94 %] 91 % (03/26 0358) ? ?Intake/Output from previous day: ?03/25 0701 - 03/26 0700 ?In: 2453.8 [P.O.:1920; I.V.:533.8] ?Out: 400 [Urine:400] ?Intake/Output this shift: ?Total I/O ?In: 360 [P.O.:360] ?Out: -  ? ?Recent Labs  ?  04/08/21 ?0318  ?HGB 10.5*  ? ?Recent Labs  ?  04/08/21 ?0318  ?WBC 17.0*  ?RBC 3.84*  ?HCT 33.6*  ?PLT 314  ? ?Recent Labs  ?  04/08/21 ?0318  ?NA 132*  ?K 3.9  ?CL 101  ?CO2 24  ?BUN 14  ?CREATININE 0.59  ?GLUCOSE 177*  ?CALCIUM 8.4*  ? ?No results for input(s): LABPT, INR in the last 72 hours. ? ?Dorsiflexion/Plantar flexion intact ?Incision: scant drainage ?Compartment soft ? ? ?Assessment/Plan: ?2 Days Post-Op Procedure(s) (LRB): ?Right TOTAL KNEE ARTHROPLASTY (Right) ?Up with therapy ?Possible discharge to home today if progresses with PT today.  ? ? ? ? ?Brandy Delgado ?04/09/2021, 8:23 AM ? ?

## 2021-04-09 NOTE — Progress Notes (Signed)
Physical Therapy Treatment ?Patient Details ?Name: Brandy Delgado ?MRN: [id number] ?DOB: Jun 11, 1945 ?Today's Date: 04/09/2021 ? ? ?History of Present Illness Pt is a 76yo female s/p R-TKA on 04/07/21. PMH: OA, hypothyroidism, bil shoulder pain, falls ? ?  ?PT Comments  ? ? Progressing with mobility. Back pain worse than knee pain on today. Will plan to have a 2nd session prior to potential d/c home later today.    ?Recommendations for follow up therapy are one component of a multi-disciplinary discharge planning process, led by the attending physician.  Recommendations may be updated based on patient status, additional functional criteria and insurance authorization. ? ?Follow Up Recommendations ? Follow physician's recommendations for discharge plan and follow up therapies ?  ?  ?Assistance Recommended at Discharge    ?Patient can return home with the following Assistance with cooking/housework;Assist for transportation;Help with stairs or ramp for entrance;A little help with walking and/or transfers;A little help with bathing/dressing/bathroom ?  ?Equipment Recommendations ? Rolling walker (2 wheels);BSC/3in1  ?  ?Recommendations for Other Services   ? ? ?  ?Precautions / Restrictions Precautions ?Precautions: Fall;Knee ?Restrictions ?Weight Bearing Restrictions: No ?RLE Weight Bearing: Weight bearing as tolerated  ?  ? ?Mobility ? Bed Mobility ?Overal bed mobility: Needs Assistance ?Bed Mobility: Sit to Supine ?  ?  ?  ?Sit to supine: Mod assist ?  ?General bed mobility comments: Assist for trunk and bil LEs. Increased time. Cues for safety, technique. ?  ? ?Transfers ?Overall transfer level: Needs assistance ?Equipment used: Rolling walker (2 wheels) ?Transfers: Sit to/from Stand ?Sit to Stand: Min assist ?  ?  ?  ?  ?  ?General transfer comment: Small amount of assist to rise, steady. Increased time mostly. Cues for safety, technique, hand/LE placement. ?  ? ?Ambulation/Gait ?Ambulation/Gait assistance: Min  guard ?Gait Distance (Feet): 85 Feet ?Assistive device: Rolling walker (2 wheels) ?Gait Pattern/deviations: Step-to pattern ?  ?  ?  ?General Gait Details: Min guard for safety. Pt tolerated distance well. ? ? ?Stairs ?  ?  ?  ?  ?  ? ? ?Wheelchair Mobility ?  ? ?Modified Rankin (Stroke Patients Only) ?  ? ? ?  ?Balance Overall balance assessment: Needs assistance, History of Falls ?  ?  ?  ?  ?Standing balance support: Bilateral upper extremity supported, During functional activity, Reliant on assistive device for balance ?Standing balance-Leahy Scale: Poor ?  ?  ?  ?  ?  ?  ?  ?  ?  ?  ?  ?  ?  ? ?  ?Cognition Arousal/Alertness: Awake/alert ?Behavior During Therapy: [facility] for tasks assessed/performed ?Overall Cognitive Status: Within Functional Limits for tasks assessed ?  ?  ?  ?  ?  ?  ?  ?  ?  ?  ?  ?  ?  ?  ?  ?  ?  ?  ?  ? ?  ?Exercises Total Joint Exercises ?Ankle Circles/Pumps: AROM, Both, 10 reps ?Quad Sets: AROM, Both, 10 reps ?Hip ABduction/ADduction: AAROM, Right, 10 reps ?Knee Flexion: AAROM, Right, 10 reps, Seated ?Goniometric ROM: ~10-65 degrees ? ?  ?General Comments   ?  ?  ? ?Pertinent Vitals/Pain Pain Assessment ?Pain Assessment: 0-10 ?Pain Score: 7  ?Pain Location: "7" in back; less in knee ?Pain Descriptors / Indicators: Discomfort, Crying ?Pain Intervention(s): Heat applied, Ice applied (heat packs to low back; ice to knee)  ? ? ?Home Living   ?  ?  ?  ?  ?  ?  ?  ?  ?  ?   ?  ?  Prior Function    ?  ?  ?   ? ?PT Goals (current goals can now be found in the care plan section) Progress towards PT goals: Progressing toward goals ? ?  ?Frequency ? ? ? 7X/week ? ? ? ?  ?PT Plan Current plan remains appropriate  ? ? ?Co-evaluation   ?  ?  ?  ?  ? ?  ?AM-PAC PT "6 Clicks" Mobility   ?Outcome Measure ? Help needed turning from your back to your side while in a flat bed without using bedrails?: A Little ?Help needed moving from lying on your back to sitting on the side of a flat bed without using  bedrails?: A Little ?Help needed moving to and from a bed to a chair (including a wheelchair)?: A Little ?Help needed standing up from a chair using your arms (e.g., wheelchair or bedside chair)?: A Little ?Help needed to walk in hospital room?: A Little ?Help needed climbing 3-5 steps with a railing? : A Lot ?6 Click Score: 17 ? ?  ?End of Session Equipment Utilized During Treatment: Gait belt ?Activity Tolerance: Patient tolerated treatment well ?Patient left: in bed;with call bell/phone within reach;with family/visitor present ?  ?PT Visit Diagnosis: Pain;Other abnormalities of gait and mobility (R26.89);History of falling (Z91.81) ?Pain - Right/Left: Right ?Pain - part of body: Knee ?  ? ? ?Time: 1000-1026 ?PT Time Calculation (min) (ACUTE ONLY): 26 min ? ?Charges:  $Gait Training: 8-22 mins ?$Therapeutic Exercise: 8-22 mins          ?          ? ? ? ? ?Doreatha Massed, PT ?Acute Rehabilitation  ?Office: [phone] ?Pager: [phone] ? ?  ? ?

## 2021-04-09 NOTE — Discharge Summary (Signed)
Patient ID: ?Brandy Delgado ?MRN: [id number] ?DOB/AGE: 1945/03/11 76 y.o. ? ?Admit date: 04/07/2021 ?Discharge date: 04/09/2021 ? ?Admission Diagnoses:  ?Principal Problem: ?  Unilateral primary osteoarthritis, right knee ?Active Problems: ?  Status post total right knee replacement ? ? ?Discharge Diagnoses:  ?Status post right total knee arthroplasty ? ?Past Medical History:  ?Diagnosis Date  ? Arthritis   ? Hypothyroidism   ? Skin cancer   ? Forehead  ? Thyroid disease   ? Unilateral primary osteoarthritis, right knee 04/06/2021  ? ? ?Surgeries: Procedure(s): ?Right TOTAL KNEE ARTHROPLASTY on 04/07/2021 ?  ?Consultants:  ? ?Discharged Condition: Improved ? ?Hospital Course: Brandy Delgado is an 76 y.o. female who was admitted 04/07/2021 for operative treatment ofUnilateral primary osteoarthritis, right knee. Patient has severe unremitting pain that affects sleep, daily activities, and work/hobbies. After pre-op clearance the patient was taken to the operating room on 04/07/2021 and underwent  Procedure(s): ?Right TOTAL KNEE ARTHROPLASTY.   ? ?Patient was given perioperative antibiotics:  ?Anti-infectives (From admission, onward)  ? ? Start     Dose/Rate Route Frequency Ordered Stop  ? 04/07/21 1900  ceFAZolin (ANCEF) IVPB 1 g/50 mL premix       ?Note to Pharmacy: Had Ancef in OR  ? 1 g ?100 mL/hr over 30 Minutes Intravenous Every 6 hours 04/07/21 1610 04/08/21 0047  ? 04/07/21 1030  clindamycin (CLEOCIN) IVPB 900 mg       ? 900 mg ?100 mL/hr over 30 Minutes Intravenous On call to O.R. 04/07/21 1021 04/07/21 1248  ? ?  ?  ? ?Patient was given sequential compression devices, early ambulation, and chemoprophylaxis to prevent DVT. ? ?Patient benefited maximally from hospital stay and there were no complications.   ? ?Recent vital signs: Patient Vitals for the past 24 hrs: ? BP Temp Temp src Pulse Resp SpO2  ?04/09/21 0358 (!) 151/60 98.2 ?F (36.8 ?C) Oral 98 16 91 %  ?04/08/21 2109 (!) 149/62 98.2 ?F (36.8 ?C) Oral 100 14 94 %   ?04/08/21 0934 132/68 98 ?F (36.7 ?C) Oral 85 18 91 %  ?  ? ?Recent laboratory studies:  ?Recent Labs  ?  04/08/21 ?0318  ?WBC 17.0*  ?HGB 10.5*  ?HCT 33.6*  ?PLT 314  ?NA 132*  ?K 3.9  ?CL 101  ?CO2 24  ?BUN 14  ?CREATININE 0.59  ?GLUCOSE 177*  ?CALCIUM 8.4*  ? ? ? ?Discharge Medications:   ?Allergies as of 04/09/2021   ? ?   Reactions  ? Ampicillin Swelling  ? Sulfa Antibiotics   ? Unknown reaction   ? ?  ? ?  ?Medication List  ?  ? ?STOP taking these medications   ? ?ibuprofen 200 MG tablet ?Commonly known as: ADVIL ?  ? ?  ? ?TAKE these medications   ? ?acetaminophen 325 MG tablet ?Commonly known as: TYLENOL ?Take 1-2 tablets (325-650 mg total) by mouth every 6 (six) hours as needed for mild pain (pain score 1-3 or temp > 100.5). ?  ?aspirin 81 MG chewable tablet ?Chew 1 tablet (81 mg total) by mouth 2 (two) times daily. ?What changed:  ?how much to take ?when to take this ?  ?Biotin 5000 MCG Caps ?Take 1 capsule by mouth daily. ?  ?cetirizine 10 MG tablet ?Commonly known as: ZYRTEC ?Take 5 mg by mouth daily as needed for allergies. ?  ?Dialyvite Vitamin D 5000 125 MCG (5000 UT) capsule ?Generic drug: Cholecalciferol ?Take 5,000 Units by mouth daily. ?  ?levothyroxine  112 MCG tablet ?Commonly known as: SYNTHROID ?Take 112 mcg by mouth daily before breakfast. ?  ?methocarbamol 500 MG tablet ?Commonly known as: ROBAXIN ?Take 1 tablet (500 mg total) by mouth every 6 (six) hours as needed for muscle spasms. ?  ?ondansetron 4 MG tablet ?Commonly known as: ZOFRAN ?Take 1 tablet (4 mg total) by mouth every 6 (six) hours as needed for nausea. ?  ?OVER THE COUNTER MEDICATION ?Take 1 tablet by mouth daily. Beet root supplement ?  ?SALONPAS PAIN RELIEF PATCH EX ?Apply 2 patches topically daily as needed (pain). ?  ?vitamin B-12 1000 MCG tablet ?Commonly known as: CYANOCOBALAMIN ?Take 1,000 mcg by mouth daily. ?  ?vitamin C 1000 MG tablet ?Take 1,000 mg by mouth daily. ?  ? ?  ? ?  ?  ? ? ?  ?Durable Medical Equipment   ?(From admission, onward)  ?  ? ? ?  ? ?  Start     Ordered  ? 04/07/21 1611  DME 3 n 1  Once       ? 04/07/21 1610  ? 04/07/21 1611  DME Walker rolling  Once       ?Question Answer Comment  ?Walker: With 5 Inch Wheels   ?Patient needs a walker to treat with the following condition Status post total right knee replacement   ?  ? 04/07/21 1610  ? ?  ?  ? ?  ? ? ?Diagnostic Studies: DG Knee Right Port ? ?Result Date: 04/07/2021 ?CLINICAL DATA:  Postop total right knee EXAM: PORTABLE RIGHT KNEE - 1-2 VIEW COMPARISON:  Right knee radiographs 01/30/2021 FINDINGS: Interval total right knee arthroplasty. Expected postoperative changes including soft tissue swelling, subcutaneous and intra-articular air, and anterior surgical skin staples. No perihardware lucency to indicate hardware failure or loosening. No acute fracture or dislocation. IMPRESSION: Status post total right knee arthroplasty without evidence of hardware failure. Electronically Signed   By: Yvonne Kendall M.D.   On: 04/07/2021 15:47   ? ?Disposition: Discharge disposition: 06-Home-Health Care Svc ? ? ? ? ? ? ? ? ? Follow-up Information   ? ? [name], MD Follow up in 2 week(s).   ?Specialty: Orthopedic Surgery ?Contact information: ?[address] ?[place] [address] ?[phone] ? ? ?  ?  ? ?  ?  ? ?  ? ? ? ?Signed: ?Alby Schwabe ?04/09/2021, 8:37 AM ? ? ? ? ?

## 2021-04-09 NOTE — Plan of Care (Signed)
Pt stable at this time. No needs at this time.  

## 2021-04-09 NOTE — TOC Transition Note (Signed)
Transition of Care (TOC) - CM/SW Discharge Note ? ? ?Patient Details  ?Name: Brandy Delgado ?MRN: [id number] ?Date of Birth: 23-Jul-1945 ? ?Transition of Care (TOC) CM/SW Contact:  ?Ross Ludwig, LCSW ?Phone Number: ?04/09/2021, 10:35 AM ? ? ?Clinical Narrative:    ? ?Patient will be going home with home health through [facility] which was prearranged.  CSW signing off please reconsult with any other social work needs, home health agency has been notified of planned discharge.  Patient is aware and will be discharging back home.  Patient needed a rolling walker and a 3 in 1, CSW contacted [employer], who will deliver equipment before she leaves. ? ? ? ?Final next level of care: [place] ?Barriers to Discharge: Barriers Resolved ? ? ?Patient Goals and CMS Choice ?Patient states their goals for this hospitalization and ongoing recovery are:: To return back home with home health services. ?CMS Medicare.gov Compare Post Acute Care list provided to:: Patient ?Choice offered to / list presented to : Patient ? ?Discharge Placement ?  ?           ?  ?  ?  ?  ? ?Discharge Plan and Services ?  ?  ?           ?DME Arranged: Walker rolling ?DME Agency: [employer] ?Date DME Agency Contacted: 04/09/21 ?Time DME Agency Contacted: [phone] ?Representative spoke with at DME Agency: Brenton Grills ?HH Arranged: PT ?[place] Agency: [place] ?Date HH Agency Contacted: 04/03/21 ?Time [place]: [phone] ?Representative spoke with at [place]: Romie Jumper ? ?Social Determinants of Health (SDOH) Interventions ?  ? ? ?Readmission Risk Interventions ?   ? View : No data to display.  ?  ?  ?  ? ? ? ? ? ?

## 2021-04-09 NOTE — Plan of Care (Signed)
  Problem: Pain Managment: Goal: General experience of comfort will improve Outcome: Progressing   Problem: Safety: Goal: Ability to remain free from injury will improve Outcome: Progressing   

## 2021-04-11 ENCOUNTER — Encounter (HOSPITAL_COMMUNITY): Payer: Self-pay | Admitting: Orthopaedic Surgery

## 2021-04-11 ENCOUNTER — Telehealth: Payer: Self-pay | Admitting: Orthopaedic Surgery

## 2021-04-11 NOTE — Telephone Encounter (Signed)
Pt called requesting a call back . Pt states she has medical questions and need a call back right away. Please call pt at [phone]. ?

## 2021-04-11 NOTE — Telephone Encounter (Signed)
Patient aware this can be normal to have drainage, if for any reason she needs to change it again she can just use gauze and tape ?

## 2021-04-20 ENCOUNTER — Other Ambulatory Visit: Payer: Self-pay

## 2021-04-20 ENCOUNTER — Ambulatory Visit (INDEPENDENT_AMBULATORY_CARE_PROVIDER_SITE_OTHER): Payer: Medicare Other | Admitting: Orthopaedic Surgery

## 2021-04-20 ENCOUNTER — Encounter: Payer: Self-pay | Admitting: Orthopaedic Surgery

## 2021-04-20 DIAGNOSIS — Z96651 Presence of right artificial knee joint: Secondary | ICD-10-CM

## 2021-04-20 NOTE — Progress Notes (Signed)
The patient is 2 weeks tomorrow status post a right total knee replacement.  She reports good range of motion and strength.  She is 76 years old and already stopping taking narcotics. ? ?The right knee incision looks good.  Staples been removed and Steri-Strips applied.  She has almost full extension and I can flex her to 90 degrees. ? ?At this point we will work on transitioning her to outpatient physical therapy.  She is asking about driving but she needs to wait at least 2 more weeks before driving since that is the right knee.  She is a very active 76 year old female.  I would like to see her back in 4 weeks from now to see how her mobility is coming along and her motion of that knee.  In the interim we will work on getting her scheduled for outpatient physical therapy here. ?

## 2021-05-02 NOTE — Therapy (Signed)
?OUTPATIENT PHYSICAL THERAPY EVALUATION ? ? ?Patient Name: Brandy Delgado ?MRN: [id number] ?DOB:[birth year], 76 y.o., female ?Today's Date: 05/03/2021 ? ? PT End of Session - 05/03/21 1543   ? ? Visit Number 1   ? Number of Visits 20   ? Date for PT Re-Evaluation 07/12/21   ? Authorization Type Medicare, [place]   ? Progress Note Due on Visit 10   ? PT Start Time 1551   ? PT Stop Time 7616   ? PT Time Calculation (min) 41 min   ? Activity Tolerance Patient tolerated treatment well   ? Behavior During Therapy [facility] for tasks assessed/performed   ? ?  ?  ? ?  ? ? ?Past Medical History:  ?Diagnosis Date  ? Arthritis   ? Hypothyroidism   ? Skin cancer   ? Forehead  ? Thyroid disease   ? Unilateral primary osteoarthritis, right knee 04/06/2021  ? ?Past Surgical History:  ?Procedure Laterality Date  ? Arm surgery Right   ? CATARACT EXTRACTION W/ INTRAOCULAR LENS IMPLANT Bilateral   ? KNEE ARTHROSCOPY Bilateral   ? TOTAL KNEE ARTHROPLASTY Right 04/07/2021  ? Procedure: Right TOTAL KNEE ARTHROPLASTY;  Surgeon: [name], MD;  Location: [facility];  Service: Orthopedics;  Laterality: Right;  ? ?Patient Active Problem List  ? Diagnosis Date Noted  ? Status post total right knee replacement 04/07/2021  ? Unilateral primary osteoarthritis, right knee 04/06/2021  ? Impingement syndrome of right shoulder 09/25/2016  ? Impingement syndrome of left shoulder 12/12/2015  ? ? ?PCP: Patient, No Pcp Per (Inactive) ? ?REFERRING PROVIDER: [name]* ? ?REFERRING DIAG: [phone] (ICD-10-CM) - Status post total right knee replacement ? ?THERAPY DIAG:  ?Chronic pain of right knee - Plan: PT plan of care cert/re-cert ? ?Muscle weakness (generalized) - Plan: PT plan of care cert/re-cert ? ?Stiffness of right knee, not elsewhere classified - Plan: PT plan of care cert/re-cert ? ?Difficulty in walking, not elsewhere classified - Plan: PT plan of care cert/re-cert ? ?Localized edema - Plan: PT plan of care cert/re-cert ? ?ONSET  DATE: 04/07/2021 Rt TKA ? ?SUBJECTIVE:  ? ?SUBJECTIVE STATEMENT: ?Pt came to clinic s/p recent Rt TKA on 04/07/2021.  Pt indicated having about 2 years of trouble prior to surgery.  Last saw home health about 1 week ago.  Taking 1/2 muscle relaxer.  ? ?PERTINENT HISTORY: ?Hypothyroidism ? ?PAIN:  ?Are you having pain? Yes:  ?NPRS scale: current 0/10, at worst 5/10 ?Pain location: Rt knee ?Pain description: tightness/stiffness, sharp at end range ?Aggravating factors: end range stretching, inactivity/stiffness, lying in bed ?Relieving factors: ice, rest ? ?PRECAUTIONS: None ? ?WEIGHT BEARING RESTRICTIONS No ? ?FALLS:  ?Has patient fallen in last 6 months? No ? ?LIVING ENVIRONMENT: ?Lives with: lives alone but daughters staying since surgery ?Lives in: House/apartment ?Stairs: no stairs ?Has following equipment at home: Single point cane, walker, potty chair ? 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 ?PLOF: Independent, liked to walk for exercise prior to pain symptoms leading to surgery.   ? ?PATIENT GOALS Reduce pain, return to driving, part time work.  ? ? ?OBJECTIVE:  ? ?PATIENT SURVEYS:  ?05/03/2021: FOTO intake: 61  predicted:  62 ? ?COGNITION: ?05/03/2021 Overall cognitive status: Within functional limits for tasks assessed   ?  ?PALPATION: ?05/03/2021 : mild tenderness medial joint line  ? ?Localized edema ?05/03/2021: noted upon inspection for Rt knee joint.  ? ?LE ROM: ? ?Active ROM Right ?05/03/2021  Left ?05/03/2021  ?Hip flexion    ?Hip extension    ?Hip abduction    ?Hip adduction    ?Hip internal rotation    ?Hip external rotation    ?Knee flexion 101 AROM in supine heel slide   ?Knee extension -5 AROM in seated LAQ   ?Ankle dorsiflexion    ?Ankle plantarflexion    ?Ankle inversion    ?Ankle eversion    ? (Blank rows = not tested) ? ?LE MMT: ? ?MMT Right ?05/03/2021 Left ?05/03/2021  ?Hip flexion 4/5 5/5  ?Hip extension    ?Hip abduction    ?Hip adduction    ?Hip internal  rotation    ?Hip external rotation    ?Knee flexion 5/5 5/5  ?Knee extension 4/5 ?35, 31.4 lbs ? 5/5 ?48.6, 43.3 lbs  ?Ankle dorsiflexion 5/5 5/5  ?Ankle plantarflexion    ?Ankle inversion    ?Ankle eversion    ? (Blank rows = not tested) ? ?FUNCTIONAL TESTS:  ?05/03/2021 ?18 inch chair transfer : unable to perform without UE ?Lt SLS: 3 seconds    Rt SLS: < 2 seconds c assistance required ? ?GAIT: ?05/03/2021 ?Distance walked: Household distances within clinic < 150 ft c SPC ?Comments: Reduced stance on Rt LE, maintained knee flexion in stance, antalgic gait noted.  ? ? ? ?TODAY'S TREATMENT: ?05/03/2021 ?Therex: ? HEP instruction/performance c cues for techniques, handout provided.  Trial set performed of each for comprehension and symptom assessment.  See below for exercise list. ? ?Vasopneumatic ? Supine 34 deg medium compression 10 mins Rt leg ? ? ?PATIENT EDUCATION:  ?05/03/2021 ?Education details: HEP, POC ?Person educated: Patient ?Education method: Explanation, Demonstration, Verbal cues, and Handouts ?Education comprehension: verbalized understanding, returned demonstration, and verbal cues required ? ? ?HOME EXERCISE PROGRAM: ?05/03/2021  ?Access Code: [email or web address] ?URL: [email or web address] ?Date: 05/03/2021 ?Prepared by: Scot Jun ? ?Exercises ?- Supine Heel Slide  - 3-5 x daily - 7 x weekly - 1-2 sets - 10 reps ?- Supine Heel Slide with Strap (Mirrored)  - 3-5 x daily - 7 x weekly - 1-2 sets - 10 reps - 5 hold ?- Supine Knee Extension Mobilization with Weight  - 1 x daily - 7 x weekly - 1 sets - 4 reps - to tolerance up to 15 mins hold ?- Seated Quad Set  - 3-5 x daily - 7 x weekly - 1 sets - 10 reps - 5 hold ?- Seated Knee Flexion Extension AAROM with Overpressure  - 3-5 x daily - 7 x weekly - 1 sets - 10 reps - 5 hold ?- Seated Long Arc Quad  - 3-5 x daily - 7 x weekly - 1 sets - 10 reps ?- Seated Straight Leg Heel Taps  - 2-3 x daily - 7 x weekly - 1 sets - 10-15  reps ? ?ASSESSMENT: ? ?CLINICAL IMPRESSION: ?Patient is a 76 y.o. who comes to clinic with complaints of Rt knee pain s/p Rt TKA on 04/07/2021 with mobility, strength and movement coordination deficits that impair their ability to perform usual daily and recreational functional activities without increase difficulty/symptoms at this time.  Patient to benefit from skilled PT services to address impairments and limitations to improve to previous level of function without restriction secondary to condition.  ? ? ?OBJECTIVE IMPAIRMENTS Abnormal gait, decreased activity tolerance, decreased balance, decreased coordination, decreased endurance, decreased mobility, difficulty walking, decreased ROM, decreased strength, hypomobility, increased edema, impaired perceived functional ability, impaired flexibility, improper body mechanics, and pain.  ? ?  ACTIVITY LIMITATIONS cleaning, community activity, driving, meal prep, occupation, and laundry.  ? ?PERSONAL FACTORS Hypothyroidismare also affecting patient's functional outcome.  ? ?REHAB POTENTIAL: Good ? ?CLINICAL DECISION MAKING: Stable/uncomplicated ? ?EVALUATION COMPLEXITY: Low ? ? ?GOALS: ?Goals reviewed with patient? Yes ? ?Short term PT Goals (target date for Short term goals are 3 weeks 05/24/2021) ?Patient will demonstrate independent use of home exercise program to maintain progress from in clinic treatments. ?Goal status: New ?  ?Long term PT goals (target dates for all long term goals are 10 weeks  07/12/2021 ) ? ?1. Patient will demonstrate/report pain at worst less than or equal to 2/10 to facilitate minimal limitation in daily activity secondary to pain symptoms. ?Goal status: New ? ?2. Patient will demonstrate independent use of home exercise program to facilitate ability to maintain/progress functional gains from skilled physical therapy services. ?Goal status: New ? ?3. Patient will demonstrate FOTO outcome > or = 62 % to indicate reduced disability due to  condition. ?Goal status: New ? ?4.  Patient will demonstrate Rt knee AROM 0-110 degrees to facilitate ability to perform transfers, sitting, ambulation, stair navigation s restriction due to mobility. ?Goal status: New ? ?5.  Patient will d

## 2021-05-03 ENCOUNTER — Encounter: Payer: Self-pay | Admitting: Rehabilitative and Restorative Service Providers"

## 2021-05-03 ENCOUNTER — Ambulatory Visit (INDEPENDENT_AMBULATORY_CARE_PROVIDER_SITE_OTHER): Payer: Medicare Other | Admitting: Rehabilitative and Restorative Service Providers"

## 2021-05-03 ENCOUNTER — Other Ambulatory Visit: Payer: Self-pay

## 2021-05-03 DIAGNOSIS — M25561 Pain in right knee: Secondary | ICD-10-CM

## 2021-05-03 DIAGNOSIS — R600 Localized edema: Secondary | ICD-10-CM

## 2021-05-03 DIAGNOSIS — M25661 Stiffness of right knee, not elsewhere classified: Secondary | ICD-10-CM

## 2021-05-03 DIAGNOSIS — R262 Difficulty in walking, not elsewhere classified: Secondary | ICD-10-CM

## 2021-05-03 DIAGNOSIS — M6281 Muscle weakness (generalized): Secondary | ICD-10-CM | POA: Diagnosis not present

## 2021-05-03 DIAGNOSIS — G8929 Other chronic pain: Secondary | ICD-10-CM

## 2021-05-04 ENCOUNTER — Encounter: Payer: Self-pay | Admitting: Physical Therapy

## 2021-05-04 ENCOUNTER — Ambulatory Visit (INDEPENDENT_AMBULATORY_CARE_PROVIDER_SITE_OTHER): Payer: Medicare Other | Admitting: Physical Therapy

## 2021-05-04 DIAGNOSIS — M6281 Muscle weakness (generalized): Secondary | ICD-10-CM

## 2021-05-04 DIAGNOSIS — M25561 Pain in right knee: Secondary | ICD-10-CM

## 2021-05-04 DIAGNOSIS — R600 Localized edema: Secondary | ICD-10-CM

## 2021-05-04 DIAGNOSIS — M25661 Stiffness of right knee, not elsewhere classified: Secondary | ICD-10-CM

## 2021-05-04 DIAGNOSIS — R262 Difficulty in walking, not elsewhere classified: Secondary | ICD-10-CM

## 2021-05-04 DIAGNOSIS — G8929 Other chronic pain: Secondary | ICD-10-CM

## 2021-05-04 NOTE — Therapy (Signed)
?OUTPATIENT PHYSICAL THERAPY TREATMENT NOTE ? ? ?Patient Name: Brandy Delgado ?MRN: [id number] ?DOB:May 14, 1945, 76 y.o., female ?Today's Date: 05/04/2021 ? ?PCP: Patient, No Pcp Per (Inactive) ?REFERRING PROVIDER: [name]* ? ?END OF SESSION:  ? PT End of Session - 05/04/21 1516   ? ? Visit Number 2   ? Number of Visits 20   ? Date for PT Re-Evaluation 07/12/21   ? Authorization Type Medicare, [place]   ? Progress Note Due on Visit 10   ? PT Start Time 1511   ? PT Stop Time 7628   ? PT Time Calculation (min) 41 min   ? Activity Tolerance Patient tolerated treatment well   ? Behavior During Therapy [facility] for tasks assessed/performed   ? ?  ?  ? ?  ? ? ?Past Medical History:  ?Diagnosis Date  ? Arthritis   ? Hypothyroidism   ? Skin cancer   ? Forehead  ? Thyroid disease   ? Unilateral primary osteoarthritis, right knee 04/06/2021  ? ?Past Surgical History:  ?Procedure Laterality Date  ? Arm surgery Right   ? CATARACT EXTRACTION W/ INTRAOCULAR LENS IMPLANT Bilateral   ? KNEE ARTHROSCOPY Bilateral   ? TOTAL KNEE ARTHROPLASTY Right 04/07/2021  ? Procedure: Right TOTAL KNEE ARTHROPLASTY;  Surgeon: [name], MD;  Location: [facility];  Service: Orthopedics;  Laterality: Right;  ? ?Patient Active Problem List  ? Diagnosis Date Noted  ? Status post total right knee replacement 04/07/2021  ? Unilateral primary osteoarthritis, right knee 04/06/2021  ? Impingement syndrome of right shoulder 09/25/2016  ? Impingement syndrome of left shoulder 12/12/2015  ? ? ?REFERRING DIAG: Z96.651 (ICD-10-CM) - Status post total right knee replacement ? ?THERAPY DIAG:  ?Chronic pain of right knee ? ?Muscle weakness (generalized) ? ?Stiffness of right knee, not elsewhere classified ? ?Difficulty in walking, not elsewhere classified ? ?Localized edema ? ?PERTINENT HISTORY: Hypothyroidism ? ?PRECAUTIONS: None ? ?SUBJECTIVE: took some tylenol because her knee was so painful after yesterday.  Pain is better now ? ?PAIN:  ?Are  you having pain? Yes: NPRS scale: 5/10 ?Pain location: Rt knee ?Pain description: aching, sharp ?Aggravating factors: bending knee, overuse, prolonged sitting ?Relieving factors: repositioning ? ? ? ? ?OBJECTIVE:  ?  ?PATIENT SURVEYS:  ?05/03/2021: FOTO intake: 61  predicted:  62 ?  ?Localized edema ?05/03/2021: noted upon inspection for Rt knee joint.  ?  ?LE ROM: ?  ?Active ROM Right ?05/03/2021 Left ?05/03/2021  ?Hip flexion      ?Hip extension      ?Hip abduction      ?Hip adduction      ?Hip internal rotation      ?Hip external rotation      ?Knee flexion 101 AROM in supine heel slide    ?Knee extension -5 AROM in seated LAQ    ?Ankle dorsiflexion      ?Ankle plantarflexion      ?Ankle inversion      ?Ankle eversion      ? (Blank rows = not tested) ?  ?LE MMT: ?  ?MMT Right ?05/03/2021 Left ?05/03/2021  ?Hip flexion 4/5 5/5  ?Hip extension      ?Hip abduction      ?Hip adduction      ?Hip internal rotation      ?Hip external rotation      ?Knee flexion 5/5 5/5  ?Knee extension 4/5 ?35, 31.4 lbs ?  5/5 ?48.6, 43.3 lbs  ?Ankle dorsiflexion 5/5 5/5  ?  Ankle plantarflexion      ?Ankle inversion      ?Ankle eversion      ? (Blank rows = not tested) ?  ?FUNCTIONAL TESTS:  ?05/03/2021 ?18 inch chair transfer : unable to perform without UE ?Lt SLS: 3 seconds    Rt SLS: < 2 seconds c assistance required ?  ?GAIT: ?05/03/2021 ?Distance walked: Household distances within clinic < 150 ft c SPC ?Comments: Reduced stance on Rt LE, maintained knee flexion in stance, antalgic gait noted.  ?  ?  ?  ?TODAY'S TREATMENT: ?05/04/21 ?Therex: ?     Aerobic: ?NuStep L5 x 8 min; seat 10 ?    Sitting: ?Seated quad set 10 x 5 sec hold; Rt ?Rt SLR x 10 reps - min cues for quad control ?Rt LAQ 2x10; 3# ?Sit to/from stand x 10 reps without UE support from elevated height ? ?    Supine: ?Active heel slide x 10 reps; Rt ?AA heel slide x 10 reps; Rt ?SAQ 2x10; Rt 5 sec hold ?Bridges x 10 reps ?Rt sidelying clam x 10 reps ? ? ? ?05/03/2021 ?Therex: ?            HEP instruction/performance c cues for techniques, handout provided.  Trial set performed of each for comprehension and symptom assessment.  See below for exercise list. ?  ?Vasopneumatic ?           Supine 34 deg medium compression 10 mins Rt leg ?  ?  ?PATIENT EDUCATION:  ?05/03/2021 ?Education details: HEP, POC ?Person educated: Patient ?Education method: Explanation, Demonstration, Verbal cues, and Handouts ?Education comprehension: verbalized understanding, returned demonstration, and verbal cues required ?  ?  ?HOME EXERCISE PROGRAM: ?05/03/2021  ?Access Code: [email or web address] ?URL: [email or web address] ?Date: 05/03/2021 ?Prepared by: Scot Jun ?  ?Exercises ?- Supine Heel Slide  - 3-5 x daily - 7 x weekly - 1-2 sets - 10 reps ?- Supine Heel Slide with Strap (Mirrored)  - 3-5 x daily - 7 x weekly - 1-2 sets - 10 reps - 5 hold ?- Supine Knee Extension Mobilization with Weight  - 1 x daily - 7 x weekly - 1 sets - 4 reps - to tolerance up to 15 mins hold ?- Seated Quad Set  - 3-5 x daily - 7 x weekly - 1 sets - 10 reps - 5 hold ?- Seated Knee Flexion Extension AAROM with Overpressure  - 3-5 x daily - 7 x weekly - 1 sets - 10 reps - 5 hold ?- Seated Long Arc Quad  - 3-5 x daily - 7 x weekly - 1 sets - 10 reps ?- Seated Straight Leg Heel Taps  - 2-3 x daily - 7 x weekly - 1 sets - 10-15 reps ?  ?ASSESSMENT: ?  ?CLINICAL IMPRESSION: ?Pt tolerated session well today with review of HEP and general strengthening exercises.  She needs min cues for HEP review at this time.  Will continue to benefit from PT to maximize function. ? ?  ?OBJECTIVE IMPAIRMENTS Abnormal gait, decreased activity tolerance, decreased balance, decreased coordination, decreased endurance, decreased mobility, difficulty walking, decreased ROM, decreased strength, hypomobility, increased edema, impaired perceived functional ability, impaired flexibility, improper body mechanics, and pain.  ?  ?ACTIVITY LIMITATIONS cleaning, community  activity, driving, meal prep, occupation, and laundry.  ?  ?PERSONAL FACTORS Hypothyroidismare also affecting patient's functional outcome.  ?  ?REHAB POTENTIAL: Good ?  ?CLINICAL DECISION MAKING: Stable/uncomplicated ?  ?EVALUATION COMPLEXITY: Low ?  ?  ?GOALS: ?Goals  reviewed with patient? Yes ?  ?Short term PT Goals (target date for Short term goals are 3 weeks 05/24/2021) ?Patient will demonstrate independent use of home exercise program to maintain progress from in clinic treatments. ?Goal status: New ?  ?Long term PT goals (target dates for all long term goals are 10 weeks  07/12/2021 ) ?  ?1. Patient will demonstrate/report pain at worst less than or equal to 2/10 to facilitate minimal limitation in daily activity secondary to pain symptoms. ?Goal status: New ?  ?2. Patient will demonstrate independent use of home exercise program to facilitate ability to maintain/progress functional gains from skilled physical therapy services. ?Goal status: New ?  ?3. Patient will demonstrate FOTO outcome > or = 62 % to indicate reduced disability due to condition. ?Goal status: New ?  ?4.  Patient will demonstrate Rt knee AROM 0-110 degrees to facilitate ability to perform transfers, sitting, ambulation, stair navigation s restriction due to mobility. ?Goal status: New ?  ?5.  Patient will demonstrate Rt LE MMT 5/5, dynamometry with 15 % of Lt knee extension to facilitate ability to perform usual standing, walking, stairs at PLOF s limitation due to symptoms.  ? Goal status: New ?  ?6.  Patient will demonstrate independent ambulation community distances > 300 ft to facilitate community integration at [facility].  ? Goal status: New ?  ?7.  Patient will demonstrate reciprocal gait pattern on stairs up/down s UE assist.  ?a.  Goal Status: New ?  ?  ?PLAN: ?PT FREQUENCY: 2x/week ?  ?PT DURATION: 10 weeks ?  ?PLANNED INTERVENTIONS: Therapeutic exercises, Therapeutic activity, Neuro Muscular re-education, Balance training, Gait  training, Patient/Family education, Joint mobilization, Stair training, DME instructions, Dry Needling, Electrical stimulation, Cryotherapy, Moist heat, Taping, Ultrasound, Ionotophoresis '4mg'$ /ml Dexamethasone

## 2021-05-05 ENCOUNTER — Telehealth: Payer: Self-pay | Admitting: Orthopaedic Surgery

## 2021-05-05 NOTE — Telephone Encounter (Signed)
Please call the pt --she is hurting since she had PT back to back this week  ?

## 2021-05-05 NOTE — Telephone Encounter (Signed)
I called pt. Advised to elevate, rest and ice over the weekend. She is to cb on Monday. If not better she will cancel PT appt and come in and see [facility] @ 3:45. Pt was ok with this plan.  ?

## 2021-05-08 ENCOUNTER — Encounter: Payer: Self-pay | Admitting: Physician Assistant

## 2021-05-08 ENCOUNTER — Ambulatory Visit (INDEPENDENT_AMBULATORY_CARE_PROVIDER_SITE_OTHER): Payer: Medicare Other | Admitting: Physician Assistant

## 2021-05-08 ENCOUNTER — Ambulatory Visit (INDEPENDENT_AMBULATORY_CARE_PROVIDER_SITE_OTHER): Payer: Medicare Other

## 2021-05-08 ENCOUNTER — Encounter: Payer: Medicare Other | Admitting: Physical Therapy

## 2021-05-08 DIAGNOSIS — Z96651 Presence of right artificial knee joint: Secondary | ICD-10-CM

## 2021-05-08 NOTE — Progress Notes (Signed)
HPI: Ms. Brandy Delgado comes in today status post right total knee arthroplasty 04/07/2021.  She states she was doing well until last Thursday.  She had audible sound within the the and discomfort while doing 3 pound weight extension exercises.  She states that now whenever she gets up she has pain in the middle of the knee but this dissipates quickly after ambulation.  She is using a cane to ambulate. ? ?Physical exam: Right knee surgical incisions healing well no signs of infection or wound dehiscence.  Active full extension flexion to proximately 100 105 degrees.  No gross instability valgus varus stressing.  Calf supple nontender. ? ? ?Radiographs: Right knee 2 views: No acute fracture.  Knee is well located.  No hardware failure. ? ?Impression: Status post right total knee arthroplasty 04/07/2021 ? ?Plan: She will continue work on range of motion strengthening the knee.  Follow-up with [place] in 1 month sooner if there is any questions concerns.  Scar tissue mobilization encouraged.  Questions were encouraged and answered. ?

## 2021-05-10 ENCOUNTER — Other Ambulatory Visit: Payer: Self-pay

## 2021-05-10 ENCOUNTER — Encounter: Payer: Self-pay | Admitting: Physical Therapy

## 2021-05-10 ENCOUNTER — Ambulatory Visit (INDEPENDENT_AMBULATORY_CARE_PROVIDER_SITE_OTHER): Payer: Medicare Other | Admitting: Physical Therapy

## 2021-05-10 DIAGNOSIS — M25561 Pain in right knee: Secondary | ICD-10-CM

## 2021-05-10 DIAGNOSIS — M25661 Stiffness of right knee, not elsewhere classified: Secondary | ICD-10-CM

## 2021-05-10 DIAGNOSIS — M6281 Muscle weakness (generalized): Secondary | ICD-10-CM | POA: Diagnosis not present

## 2021-05-10 DIAGNOSIS — R262 Difficulty in walking, not elsewhere classified: Secondary | ICD-10-CM | POA: Diagnosis not present

## 2021-05-10 DIAGNOSIS — R600 Localized edema: Secondary | ICD-10-CM

## 2021-05-10 DIAGNOSIS — G8929 Other chronic pain: Secondary | ICD-10-CM

## 2021-05-10 NOTE — Therapy (Signed)
?OUTPATIENT PHYSICAL THERAPY TREATMENT NOTE ? ? ?Patient Name: Brandy Delgado ?MRN: [id number] ?DOB:08/13/1945, 76 y.o., female ?Today's Date: 05/10/2021 ? ?PCP: Patient, No Pcp Per (Inactive) ?REFERRING PROVIDER: [name]* ? ?END OF SESSION:  ? PT End of Session - 05/10/21 1432   ? ? Visit Number 3   ? Number of Visits 20   ? Date for PT Re-Evaluation 07/12/21   ? Authorization Type Medicare, [place]   ? Progress Note Due on Visit 10   ? PT Start Time 1430   ? PT Stop Time 1525   ? PT Time Calculation (min) 55 min   ? Activity Tolerance Patient tolerated treatment well   ? Behavior During Therapy [facility] for tasks assessed/performed   ? ?  ?  ? ?  ? ? ? ?Past Medical History:  ?Diagnosis Date  ? Arthritis   ? Hypothyroidism   ? Skin cancer   ? Forehead  ? Thyroid disease   ? Unilateral primary osteoarthritis, right knee 04/06/2021  ? ?Past Surgical History:  ?Procedure Laterality Date  ? Arm surgery Right   ? CATARACT EXTRACTION W/ INTRAOCULAR LENS IMPLANT Bilateral   ? KNEE ARTHROSCOPY Bilateral   ? TOTAL KNEE ARTHROPLASTY Right 04/07/2021  ? Procedure: Right TOTAL KNEE ARTHROPLASTY;  Surgeon: [name], MD;  Location: [facility];  Service: Orthopedics;  Laterality: Right;  ? ?Patient Active Problem List  ? Diagnosis Date Noted  ? Status post total right knee replacement 04/07/2021  ? Unilateral primary osteoarthritis, right knee 04/06/2021  ? Impingement syndrome of right shoulder 09/25/2016  ? Impingement syndrome of left shoulder 12/12/2015  ? ? ?REFERRING DIAG: Z96.651 (ICD-10-CM) - Status post total right knee replacement ? ?THERAPY DIAG:  ?Chronic pain of right knee ? ?Muscle weakness (generalized) ? ?Stiffness of right knee, not elsewhere classified ? ?Difficulty in walking, not elsewhere classified ? ?Localized edema ? ?PERTINENT HISTORY: Hypothyroidism ? ?PRECAUTIONS: None ? ?SUBJECTIVE:  She had PT Wed & Thurs back to back and knee hurt so much that she could not put foot down.  ? ?PAIN:   ?Are you having pain?  Yes: NPRS scale: 2/10 upon arrival & in last 2 days 1/10 & 5/10 ?Pain location: Rt knee ?Pain description: aching, sharp ?Aggravating factors: bending knee, overuse, prolonged sitting ?Relieving factors: repositioning ? ? ? ? ?OBJECTIVE:  ?  ?PATIENT SURVEYS:  ?05/03/2021: FOTO intake: 61  predicted:  62 ?  ?Localized edema ?05/03/2021: noted upon inspection for Rt knee joint.  ?  ?LE ROM: ?  ?ROM ?P:passive  A:active  Right ?05/03/21 Right ?05/10/21  ?Hip flexion      ?Hip extension      ?Hip abduction      ?Hip adduction      ?Hip internal rotation      ?Hip external rotation      ?Knee flexion 101 AROM in supine heel slide  A: 102* ?supine  ?Knee extension -5 AROM in seated LAQ A: -5* ?Seated LAQ   ?Ankle dorsiflexion      ?Ankle plantarflexion      ?Ankle inversion      ?Ankle eversion      ? (Blank rows = not tested) ?  ?LE MMT: ?  ?MMT Right ?05/03/2021 Left ?05/03/2021  ?Hip flexion 4/5 5/5  ?Hip extension      ?Hip abduction      ?Hip adduction      ?Hip internal rotation      ?Hip external rotation      ?  Knee flexion 5/5 5/5  ?Knee extension 4/5 ?35, 31.4 lbs ?  5/5 ?48.6, 43.3 lbs  ?Ankle dorsiflexion 5/5 5/5  ?Ankle plantarflexion      ?Ankle inversion      ?Ankle eversion      ? (Blank rows = not tested) ?  ?FUNCTIONAL TESTS:  ?05/03/2021 ?18 inch chair transfer : unable to perform without UE ?Lt SLS: 3 seconds    Rt SLS: < 2 seconds c assistance required ?  ?GAIT: ?05/03/2021 ?Distance walked: Household distances within clinic < 150 ft c SPC ?Comments: Reduced stance on Rt LE, maintained knee flexion in stance, antalgic gait noted.  ?  ?  ?  ?TODAY'S TREATMENT: ?05/10/2021 ?Therex: ?     Aerobic: ?NuStep L7 with BLEs & BUEs for 6 min and LEs only level 4 for 2 min - total 8 min; seat 10 ? ?    Sitting: ?Seated quad set 10 x 5 sec hold; Rt ?Rt LAQ 3# & active knee flexion  2x10; 3# ?Sit to/from stand x 10 reps without UE support from 22" elevated height. PT demo & verbal cues on technique.   ? ?    Supine: ?Rt SLR x 10 reps - min cues for quad control ?AA heel slide rolling 55cm ball with BLE with strap RLE assist x 10 reps 5 sec hold flex & ext. ?Active heel slide x 10 reps; Rt ?SAQ 3# 2x10; Rt 5 sec hold ?Bridges x 10 reps ? ?Standing:  ?bil. Heel raises 10 reps with BUE support ?Step up 2" step with BUE support 10 reps ? ?Neuromuscular Re-ed: ?Tandem stance on foam beam: RLE in front & in back 30 sec 2 reps ea.  ? ?Vasopneumatic: ?Supine 34 deg medium compression 10 mins Rt leg ? ?05/04/21 ?Therex: ?     Aerobic: ?NuStep L5 x 8 min; seat 10 ?    Sitting: ?Seated quad set 10 x 5 sec hold; Rt ?Rt SLR x 10 reps - min cues for quad control ?Rt LAQ 2x10; 3# ?Sit to/from stand x 10 reps without UE support from elevated height ? ?    Supine: ?Active heel slide x 10 reps; Rt ?AA heel slide x 10 reps; Rt ?SAQ 2x10; Rt 5 sec hold ?Bridges x 10 reps ?Rt sidelying clam x 10 reps ? ? ? ?05/03/2021 ?Therex: ?           HEP instruction/performance c cues for techniques, handout provided.  Trial set performed of each for comprehension and symptom assessment.  See below for exercise list. ?  ?Vasopneumatic ?           Supine 34 deg medium compression 10 mins Rt leg ?  ?  ?PATIENT EDUCATION:  ?05/03/2021 ?Education details: HEP, POC ?Person educated: Patient ?Education method: Explanation, Demonstration, Verbal cues, and Handouts ?Education comprehension: verbalized understanding, returned demonstration, and verbal cues required ?  ?  ?HOME EXERCISE PROGRAM: ?05/03/2021  ?Access Code: [email or web address] ?URL: [email or web address] ?Date: 05/03/2021 ?Prepared by: Scot Jun ?  ?Exercises ?- Supine Heel Slide  - 3-5 x daily - 7 x weekly - 1-2 sets - 10 reps ?- Supine Heel Slide with Strap (Mirrored)  - 3-5 x daily - 7 x weekly - 1-2 sets - 10 reps - 5 hold ?- Supine Knee Extension Mobilization with Weight  - 1 x daily - 7 x weekly - 1 sets - 4 reps - to tolerance up to 15 mins hold ?- Seated Quad Set  - 3-5 x daily  -  7 x weekly - 1 sets - 10 reps - 5 hold ?- Seated Knee Flexion Extension AAROM with Overpressure  - 3-5 x daily - 7 x weekly - 1 sets - 10 reps - 5 hold ?- Seated Long Arc Quad  - 3-5 x daily - 7 x weekly - 1 sets - 10 reps ?- Seated Straight Leg Heel Taps  - 2-3 x daily - 7 x weekly - 1 sets - 10-15 reps ?  ?ASSESSMENT: ?  ?CLINICAL IMPRESSION: ?Patient reports significant increase in pain after last session. She seemed to tolerate exercises today better with no significant change in pain.  Patient continues to benefit from skilled PT to progress mobility & safety following her TKR.  ?  ?OBJECTIVE IMPAIRMENTS Abnormal gait, decreased activity tolerance, decreased balance, decreased coordination, decreased endurance, decreased mobility, difficulty walking, decreased ROM, decreased strength, hypomobility, increased edema, impaired perceived functional ability, impaired flexibility, improper body mechanics, and pain.  ?  ?ACTIVITY LIMITATIONS cleaning, community activity, driving, meal prep, occupation, and laundry.  ?  ?PERSONAL FACTORS Hypothyroidismare also affecting patient's functional outcome.  ?  ?REHAB POTENTIAL: Good ?  ?CLINICAL DECISION MAKING: Stable/uncomplicated ?  ?EVALUATION COMPLEXITY: Low ?  ?  ?GOALS: ?Goals reviewed with patient? Yes ?  ?Short term PT Goals (target date for Short term goals are 3 weeks 05/24/2021) ?Patient will demonstrate independent use of home exercise program to maintain progress from in clinic treatments. ?Goal status: New ?  ?Long term PT goals (target dates for all long term goals are 10 weeks  07/12/2021 ) ?  ?1. Patient will demonstrate/report pain at worst less than or equal to 2/10 to facilitate minimal limitation in daily activity secondary to pain symptoms. ?Goal status: New ?  ?2. Patient will demonstrate independent use of home exercise program to facilitate ability to maintain/progress functional gains from skilled physical therapy services. ?Goal status: New ?  ?3.  Patient will demonstrate FOTO outcome > or = 62 % to indicate reduced disability due to condition. ?Goal status: New ?  ?4.  Patient will demonstrate Rt knee AROM 0-110 degrees to facilitate abilit

## 2021-05-15 ENCOUNTER — Ambulatory Visit (INDEPENDENT_AMBULATORY_CARE_PROVIDER_SITE_OTHER): Payer: Medicare Other | Admitting: Rehabilitative and Restorative Service Providers"

## 2021-05-15 ENCOUNTER — Encounter: Payer: Self-pay | Admitting: Rehabilitative and Restorative Service Providers"

## 2021-05-15 ENCOUNTER — Other Ambulatory Visit: Payer: Self-pay

## 2021-05-15 DIAGNOSIS — R262 Difficulty in walking, not elsewhere classified: Secondary | ICD-10-CM | POA: Diagnosis not present

## 2021-05-15 DIAGNOSIS — G8929 Other chronic pain: Secondary | ICD-10-CM

## 2021-05-15 DIAGNOSIS — M25661 Stiffness of right knee, not elsewhere classified: Secondary | ICD-10-CM | POA: Diagnosis not present

## 2021-05-15 DIAGNOSIS — M6281 Muscle weakness (generalized): Secondary | ICD-10-CM

## 2021-05-15 DIAGNOSIS — R600 Localized edema: Secondary | ICD-10-CM

## 2021-05-15 DIAGNOSIS — M25561 Pain in right knee: Secondary | ICD-10-CM

## 2021-05-15 NOTE — Therapy (Addendum)
?OUTPATIENT PHYSICAL THERAPY TREATMENT NOTE ? ? ?Patient Name: Brandy Delgado ?MRN: [id number] ?DOB:1945/04/26, 76 y.o., female ?Today's Date: 05/15/2021 ? ?PCP: Patient, No Pcp Per (Inactive) ?REFERRING PROVIDER: [name]* ? ?END OF SESSION:  ? PT End of Session - 05/15/21 1517   ? ? Visit Number 4   ? Number of Visits 20   ? Date for PT Re-Evaluation 07/12/21   ? Authorization Type Medicare, [place]   ? Progress Note Due on Visit 10   ? PT Start Time 1508   ? PT Stop Time 1556   ? PT Time Calculation (min) 48 min   ? Activity Tolerance Patient tolerated treatment well   ? Behavior During Therapy [facility] for tasks assessed/performed   ? ?  ?  ? ?  ? ? ? ? ?Past Medical History:  ?Diagnosis Date  ? Arthritis   ? Hypothyroidism   ? Skin cancer   ? Forehead  ? Thyroid disease   ? Unilateral primary osteoarthritis, right knee 04/06/2021  ? ?Past Surgical History:  ?Procedure Laterality Date  ? Arm surgery Right   ? CATARACT EXTRACTION W/ INTRAOCULAR LENS IMPLANT Bilateral   ? KNEE ARTHROSCOPY Bilateral   ? TOTAL KNEE ARTHROPLASTY Right 04/07/2021  ? Procedure: Right TOTAL KNEE ARTHROPLASTY;  Surgeon: [name], MD;  Location: [facility];  Service: Orthopedics;  Laterality: Right;  ? ?Patient Active Problem List  ? Diagnosis Date Noted  ? Status post total right knee replacement 04/07/2021  ? Unilateral primary osteoarthritis, right knee 04/06/2021  ? Impingement syndrome of right shoulder 09/25/2016  ? Impingement syndrome of left shoulder 12/12/2015  ? ? ?REFERRING DIAG: Z96.651 (ICD-10-CM) - Status post total right knee replacement ? ?THERAPY DIAG:  ?Chronic pain of right knee ? ?Muscle weakness (generalized) ? ?Stiffness of right knee, not elsewhere classified ? ?Difficulty in walking, not elsewhere classified ? ?Localized edema ? ?PERTINENT HISTORY: Hypothyroidism ? ?PRECAUTIONS: None ? ?SUBJECTIVE:  She had PT Wed & Thurs back to back and knee hurt so much that she could not put foot down.   ? ?PAIN:  ?Are you having pain?  Yes:  ?NPRS scale: 2-3/10 pain upon arrival ?Pain location: Rt knee ?Pain description: aching, sharp ?Aggravating factors: bending knee, overuse, prolonged sitting, nighttime pain ?Relieving factors: repositioning ? ? ? ? ?OBJECTIVE:  ?  ?PATIENT SURVEYS:  ?05/03/2021: FOTO intake: 61  predicted:  62 ?  ?Localized edema ?05/03/2021: noted upon inspection for Rt knee joint.  ?  ?LE ROM: ?  ?ROM ?P:passive  A:active  Right ?05/03/21 Right ?05/10/21  ?  ?Hip flexion       ?Hip extension       ?Hip abduction       ?Hip adduction       ?Hip internal rotation       ?Hip external rotation       ?Knee flexion 101 AROM in supine heel slide  A: 102* ?supine   ?Knee extension -5 AROM in seated LAQ A: -5* ?Seated LAQ    ?Ankle dorsiflexion       ?Ankle plantarflexion       ?Ankle inversion       ?Ankle eversion       ? (Blank rows = not tested) ?  ?LE MMT: ?  ?MMT Right ?05/03/2021 Left ?05/03/2021   ?Hip flexion 4/5 5/5   ?Hip extension       ?Hip abduction       ?Hip adduction       ?  Hip internal rotation       ?Hip external rotation       ?Knee flexion 5/5 5/5   ?Knee extension 4/5 ?35, 31.4 lbs ?  5/5 ?48.6, 43.3 lbs   ?Ankle dorsiflexion 5/5 5/5   ?Ankle plantarflexion       ?Ankle inversion       ?Ankle eversion       ? (Blank rows = not tested) ?  ?FUNCTIONAL TESTS:  ?05/03/2021 ?18 inch chair transfer : unable to perform without UE ?Lt SLS: 3 seconds    Rt SLS: < 2 seconds c assistance required ? ?GAIT: ?05/03/2021 ?Distance walked: Household distances within clinic < 150 ft c SPC ?Comments: Reduced stance on Rt LE, maintained knee flexion in stance, antalgic gait noted.  ?  ?  ?  ?TODAY'S TREATMENT: ?05/15/2021 ?Therex: ? Recumbent bike partial circles 8 mins , seat 7 ? Incline board gastroc stretch 30 sec x 3 bilateral ? Leg press Double leg 50 lbs x 15, Rt leg only 25 lbs 2 x 10  ? Seated SLR Rt full range c pause in end ranges x 15 5 lbs ? ?Neuromuscular Re-ed: ?Tandem stance on foam 1 min x  1 bilateral occasional hand assist on rail ?Alt toe/heel raise ankle strategy rockers x 15   ? ?Vasopneumatic: ?Supine 34 deg medium compression 10 mins Rt leg ? ?05/10/2021 ?Therex: ?     Aerobic: ?NuStep L7 with BLEs & BUEs for 6 min and LEs only level 4 for 2 min - total 8 min; seat 10 ? ?    Sitting: ?Seated quad set 10 x 5 sec hold; Rt ?Rt LAQ 3# & active knee flexion  2x10; 3# ?Sit to/from stand x 10 reps without UE support from 22" elevated height. PT demo & verbal cues on technique.  ? ?    Supine: ?Rt SLR x 10 reps - min cues for quad control ?AA heel slide rolling 55cm ball with BLE with strap RLE assist x 10 reps 5 sec hold flex & ext. ?Active heel slide x 10 reps; Rt ?SAQ 3# 2x10; Rt 5 sec hold ?Bridges x 10 reps ? ?Standing:  ?bil. Heel raises 10 reps with BUE support ?Step up 2" step with BUE support 10 reps ? ?Neuromuscular Re-ed: ?Tandem stance on foam beam: RLE in front & in back 30 sec 2 reps ea.  ? ?Vasopneumatic: ?Supine 34 deg medium compression 10 mins Rt leg ? ?05/04/21 ?Therex: ?     Aerobic: ?NuStep L5 x 8 min; seat 10 ?    Sitting: ?Seated quad set 10 x 5 sec hold; Rt ?Rt SLR x 10 reps - min cues for quad control ?Rt LAQ 2x10; 3# ?Sit to/from stand x 10 reps without UE support from elevated height ? ?    Supine: ?Active heel slide x 10 reps; Rt ?AA heel slide x 10 reps; Rt ?SAQ 2x10; Rt 5 sec hold ?Bridges x 10 reps ?Rt sidelying clam x 10 reps ? ? ? ?05/03/2021 ?Therex: ?           HEP instruction/performance c cues for techniques, handout provided.  Trial set performed of each for comprehension and symptom assessment.  See below for exercise list. ?  ?Vasopneumatic ?           Supine 34 deg medium compression 10 mins Rt leg ?  ?  ?PATIENT EDUCATION:  ?05/03/2021 ?Education details: HEP, POC ?Person educated: Patient ?Education method: Explanation, Demonstration, Verbal cues, and  Handouts ?Education comprehension: verbalized understanding, returned demonstration, and verbal cues required ?  ?   ?HOME EXERCISE PROGRAM: ?05/03/2021  ?Access Code: [email or web address] ?URL: [email or web address] ?Date: 05/03/2021 ?Prepared by: Scot Jun ?  ?Exercises ?- Supine Heel Slide  - 3-5 x daily - 7 x weekly - 1-2 sets - 10 reps ?- Supine Heel Slide with Strap (Mirrored)  - 3-5 x daily - 7 x weekly - 1-2 sets - 10 reps - 5 hold ?- Supine Knee Extension Mobilization with Weight  - 1 x daily - 7 x weekly - 1 sets - 4 reps - to tolerance up to 15 mins hold ?- Seated Quad Set  - 3-5 x daily - 7 x weekly - 1 sets - 10 reps - 5 hold ?- Seated Knee Flexion Extension AAROM with Overpressure  - 3-5 x daily - 7 x weekly - 1 sets - 10 reps - 5 hold ?- Seated Long Arc Quad  - 3-5 x daily - 7 x weekly - 1 sets - 10 reps ?- Seated Straight Leg Heel Taps  - 2-3 x daily - 7 x weekly - 1 sets - 10-15 reps ?  ?ASSESSMENT: ?  ?CLINICAL IMPRESSION: ?Patient reports significant increase in pain after last session. She seemed to tolerate exercises today better with no significant change in pain.  Patient continues to benefit from skilled PT to progress mobility & safety following her TKR.  ?  ?OBJECTIVE IMPAIRMENTS Abnormal gait, decreased activity tolerance, decreased balance, decreased coordination, decreased endurance, decreased mobility, difficulty walking, decreased ROM, decreased strength, hypomobility, increased edema, impaired perceived functional ability, impaired flexibility, improper body mechanics, and pain.  ?  ?ACTIVITY LIMITATIONS cleaning, community activity, driving, meal prep, occupation, and laundry.  ?  ?PERSONAL FACTORS Hypothyroidismare also affecting patient's functional outcome.  ?  ?REHAB POTENTIAL: Good ?  ?CLINICAL DECISION MAKING: Stable/uncomplicated ?  ?EVALUATION COMPLEXITY: Low ?  ?  ?GOALS: ?Goals reviewed with patient? Yes ?  ?Short term PT Goals (target date for Short term goals are 3 weeks 05/24/2021) ?Patient will demonstrate independent use of home exercise program to maintain progress from in  clinic treatments. ?Goal status: New ?  ?Long term PT goals (target dates for all long term goals are 10 weeks  07/12/2021 ) ?  ?1. Patient will demonstrate/report pain at worst less than or equal to 2/10 to faci

## 2021-05-18 ENCOUNTER — Encounter: Payer: Medicare Other | Admitting: Orthopaedic Surgery

## 2021-05-19 ENCOUNTER — Encounter: Payer: Self-pay | Admitting: Rehabilitative and Restorative Service Providers"

## 2021-05-19 ENCOUNTER — Ambulatory Visit (INDEPENDENT_AMBULATORY_CARE_PROVIDER_SITE_OTHER): Payer: Medicare Other | Admitting: Rehabilitative and Restorative Service Providers"

## 2021-05-19 DIAGNOSIS — M6281 Muscle weakness (generalized): Secondary | ICD-10-CM | POA: Diagnosis not present

## 2021-05-19 DIAGNOSIS — M25661 Stiffness of right knee, not elsewhere classified: Secondary | ICD-10-CM

## 2021-05-19 DIAGNOSIS — R262 Difficulty in walking, not elsewhere classified: Secondary | ICD-10-CM

## 2021-05-19 DIAGNOSIS — M25561 Pain in right knee: Secondary | ICD-10-CM | POA: Diagnosis not present

## 2021-05-19 DIAGNOSIS — R600 Localized edema: Secondary | ICD-10-CM

## 2021-05-19 DIAGNOSIS — G8929 Other chronic pain: Secondary | ICD-10-CM

## 2021-05-19 NOTE — Therapy (Signed)
?OUTPATIENT PHYSICAL THERAPY TREATMENT NOTE ? ? ?Patient Name: Brandy Delgado ?MRN: [id number] ?DOB:07/28/1945, 76 y.o., female ?Today's Date: 05/19/2021 ? ?PCP: Patient, No Pcp Per (Inactive) ?REFERRING PROVIDER: [name]* ? ?END OF SESSION:  ? PT End of Session - 05/19/21 1335   ? ? Visit Number 5   ? Number of Visits 20   ? Date for PT Re-Evaluation 07/12/21   ? Authorization Type Medicare, [employer]   ? Progress Note Due on Visit 10   ? PT Start Time 1335   ? PT Stop Time 1425   ? PT Time Calculation (min) 50 min   ? Activity Tolerance Patient tolerated treatment well   ? Behavior During Therapy [facility] for tasks assessed/performed   ? ?  ?  ? ?  ? ? ? ? ? ?Past Medical History:  ?Diagnosis Date  ? Arthritis   ? Hypothyroidism   ? Skin cancer   ? Forehead  ? Thyroid disease   ? Unilateral primary osteoarthritis, right knee 04/06/2021  ? ?Past Surgical History:  ?Procedure Laterality Date  ? Arm surgery Right   ? CATARACT EXTRACTION W/ INTRAOCULAR LENS IMPLANT Bilateral   ? KNEE ARTHROSCOPY Bilateral   ? TOTAL KNEE ARTHROPLASTY Right 04/07/2021  ? Procedure: Right TOTAL KNEE ARTHROPLASTY;  Surgeon: [name], MD;  Location: [facility];  Service: Orthopedics;  Laterality: Right;  ? ?Patient Active Problem List  ? Diagnosis Date Noted  ? Status post total right knee replacement 04/07/2021  ? Unilateral primary osteoarthritis, right knee 04/06/2021  ? Impingement syndrome of right shoulder 09/25/2016  ? Impingement syndrome of left shoulder 12/12/2015  ? ? ?REFERRING DIAG: Z96.651 (ICD-10-CM) - Status post total right knee replacement ? ?THERAPY DIAG:  ?Chronic pain of right knee ? ?Muscle weakness (generalized) ? ?Stiffness of right knee, not elsewhere classified ? ?Difficulty in walking, not elsewhere classified ? ?Localized edema ? ?PERTINENT HISTORY: Hypothyroidism ? ?PRECAUTIONS: None ? ?SUBJECTIVE: Pt indicated since Monday has been committed to doing HEP and felt like she has been doing better.   ? ?PAIN:  ?Are you having pain?   ?NPRS scale: 0/10 upon arrival  ?Pain location: Rt knee ?Pain description: aching, sharp ?Aggravating factors: stiffness c getting up ?Relieving factors: repositioning, HEP ? ? ? ? ?OBJECTIVE:  ?  ?PATIENT SURVEYS:  ?05/03/2021: FOTO intake: 61  predicted:  62 ?  ?Localized edema ?05/03/2021: noted upon inspection for Rt knee joint.  ?  ?LE ROM: ?  ?ROM ?P:passive  A:active  Right ?05/03/21 Right ?05/10/21 Right ?05/19/2021  ?Hip flexion       ?Hip extension       ?Hip abduction       ?Hip adduction       ?Hip internal rotation       ?Hip external rotation       ?Knee flexion 101 AROM in supine heel slide  A: 102* ?supine AROM in supine heel slide: ?105 ?  ?Knee extension -5 AROM in seated LAQ A: -5* ?Seated LAQ  AROM in seated LAQ: ?-4  ?Ankle dorsiflexion       ?Ankle plantarflexion       ?Ankle inversion       ?Ankle eversion       ? (Blank rows = not tested) ?  ?LE MMT: ?  ?MMT Right ?05/03/2021 Left ?05/03/2021 Right ?05/19/2021  ?Hip flexion 4/5 5/5   ?Hip extension       ?Hip abduction       ?  Hip adduction       ?Hip internal rotation       ?Hip external rotation       ?Knee flexion 5/5 5/5   ?Knee extension 4/5 ?35, 31.4 lbs ?  5/5 ?48.6, 43.3 lbs 33 lbs, 32 lbs  ?Ankle dorsiflexion 5/5 5/5   ?Ankle plantarflexion       ?Ankle inversion       ?Ankle eversion       ? (Blank rows = not tested) ?  ?FUNCTIONAL TESTS:  ?05/03/2021 ?18 inch chair transfer : unable to perform without UE ?Lt SLS: 3 seconds    Rt SLS: < 2 seconds c assistance required ? ?GAIT: ?05/03/2021 ?Distance walked: Household distances within clinic < 150 ft c SPC ?Comments: Reduced stance on Rt LE, maintained knee flexion in stance, antalgic gait noted.  ?  ?  ?  ?TODAY'S TREATMENT: ?05/19/2021 ?Therex: ? UBE LE only Lvl 1 full revolution 8 mins seat 12 ? Supine Rt heel slide AROM x 10 ? Incline board gastroc stretch 30 sec x 3 bilateral ? Leg press Rt leg only 25 lbs 2 x 10  ? Machine double leg extension, Rt leg  eccentric lowering 3 x 10 5 lbs ? ?Neuromuscular Re-ed: ?Chuch pew ankle strategy fwd/back weight shifts without lifting foot x 7 each way ?Retro step x 15 bilateral   ? ?Vasopneumatic: ?Supine 34 deg medium compression 10 mins Rt leg ? ?05/15/2021 ?Therex: ? Recumbent bike partial circles 8 mins , seat 7 ? Incline board gastroc stretch 30 sec x 3 bilateral ? Leg press Double leg 50 lbs x 15, Rt leg only 25 lbs 2 x 10  ? Seated SAQ Rt full range c pause in end ranges x 15 5 lbs ? ?Neuromuscular Re-ed: ?Tandem stance on foam 1 min x 1 bilateral occasional hand assist on rail ?Alt toe/heel raise ankle strategy rockers x 15   ? ?Vasopneumatic: ?Supine 34 deg medium compression 10 mins Rt leg ? ?05/10/2021 ?Therex: ?     Aerobic: ?NuStep L7 with BLEs & BUEs for 6 min and LEs only level 4 for 2 min - total 8 min; seat 10 ? ?    Sitting: ?Seated quad set 10 x 5 sec hold; Rt ?Rt LAQ 3# & active knee flexion  2x10; 3# ?Sit to/from stand x 10 reps without UE support from 22" elevated height. PT demo & verbal cues on technique.  ? ?    Supine: ?Rt SLR x 10 reps - min cues for quad control ?AA heel slide rolling 55cm ball with BLE with strap RLE assist x 10 reps 5 sec hold flex & ext. ?Active heel slide x 10 reps; Rt ?SAQ 3# 2x10; Rt 5 sec hold ?Bridges x 10 reps ? ?Standing:  ?bil. Heel raises 10 reps with BUE support ?Step up 2" step with BUE support 10 reps ? ?Neuromuscular Re-ed: ?Tandem stance on foam beam: RLE in front & in back 30 sec 2 reps ea.  ? ?Vasopneumatic: ?Supine 34 deg medium compression 10 mins Rt leg ? ?05/04/21 ?Therex: ?     Aerobic: ?NuStep L5 x 8 min; seat 10 ?    Sitting: ?Seated quad set 10 x 5 sec hold; Rt ?Rt SLR x 10 reps - min cues for quad control ?Rt LAQ 2x10; 3# ?Sit to/from stand x 10 reps without UE support from elevated height ? ?    Supine: ?Active heel slide x 10 reps; Rt ?AA heel slide x  10 reps; Rt ?SAQ 2x10; Rt 5 sec hold ?Bridges x 10 reps ?Rt sidelying clam x 10 reps ? ?  ?  ?PATIENT  EDUCATION:  ?05/03/2021 ?Education details: HEP, POC ?Person educated: Patient ?Education method: Explanation, Demonstration, Verbal cues, and Handouts ?Education comprehension: verbalized understanding, returned demonstration, and verbal cues required ?  ?  ?HOME EXERCISE PROGRAM: ?05/03/2021  ?Access Code: [email or web address] ?URL: [email or web address] ?Date: 05/03/2021 ?Prepared by: Scot Jun ?  ?Exercises ?- Supine Heel Slide  - 3-5 x daily - 7 x weekly - 1-2 sets - 10 reps ?- Supine Heel Slide with Strap (Mirrored)  - 3-5 x daily - 7 x weekly - 1-2 sets - 10 reps - 5 hold ?- Supine Knee Extension Mobilization with Weight  - 1 x daily - 7 x weekly - 1 sets - 4 reps - to tolerance up to 15 mins hold ?- Seated Quad Set  - 3-5 x daily - 7 x weekly - 1 sets - 10 reps - 5 hold ?- Seated Knee Flexion Extension AAROM with Overpressure  - 3-5 x daily - 7 x weekly - 1 sets - 10 reps - 5 hold ?- Seated Long Arc Quad  - 3-5 x daily - 7 x weekly - 1 sets - 10 reps ?- Seated Straight Leg Heel Taps  - 2-3 x daily - 7 x weekly - 1 sets - 10-15 reps ?  ?ASSESSMENT: ?  ?CLINICAL IMPRESSION: ?AROM measurements closing on functional goals.   Pt to continue to benefit from progressive strengthening program to improve functional WB control.  ?  ?OBJECTIVE IMPAIRMENTS Abnormal gait, decreased activity tolerance, decreased balance, decreased coordination, decreased endurance, decreased mobility, difficulty walking, decreased ROM, decreased strength, hypomobility, increased edema, impaired perceived functional ability, impaired flexibility, improper body mechanics, and pain.  ?  ?ACTIVITY LIMITATIONS cleaning, community activity, driving, meal prep, occupation, and laundry.  ?  ?PERSONAL FACTORS Hypothyroidismare also affecting patient's functional outcome.  ?  ?REHAB POTENTIAL: Good ?  ?CLINICAL DECISION MAKING: Stable/uncomplicated ?  ?EVALUATION COMPLEXITY: Low ?  ?  ?GOALS: ?Goals reviewed with patient? Yes ?  ?Short term PT  Goals (target date for Short term goals are 3 weeks 05/24/2021) ?Patient will demonstrate independent use of home exercise program to maintain progress from in clinic treatments. ?Goal status: MET - assessed 5/5/

## 2021-05-23 ENCOUNTER — Ambulatory Visit (INDEPENDENT_AMBULATORY_CARE_PROVIDER_SITE_OTHER): Payer: Medicare Other | Admitting: Physical Therapy

## 2021-05-23 ENCOUNTER — Encounter: Payer: Self-pay | Admitting: Physical Therapy

## 2021-05-23 DIAGNOSIS — M25661 Stiffness of right knee, not elsewhere classified: Secondary | ICD-10-CM | POA: Diagnosis not present

## 2021-05-23 DIAGNOSIS — R262 Difficulty in walking, not elsewhere classified: Secondary | ICD-10-CM

## 2021-05-23 DIAGNOSIS — M6281 Muscle weakness (generalized): Secondary | ICD-10-CM

## 2021-05-23 DIAGNOSIS — G8929 Other chronic pain: Secondary | ICD-10-CM

## 2021-05-23 DIAGNOSIS — R600 Localized edema: Secondary | ICD-10-CM

## 2021-05-23 DIAGNOSIS — M25561 Pain in right knee: Secondary | ICD-10-CM

## 2021-05-23 NOTE — Therapy (Signed)
?OUTPATIENT PHYSICAL THERAPY TREATMENT NOTE ? ? ?Patient Name: Brandy Delgado ?MRN: [id number] ?DOB:Jan 15, 1946, 76 y.o., female ?Today's Date: 05/23/2021 ? ?PCP: Patient, No Pcp Per (Inactive) ?REFERRING PROVIDER: [name]* ? ?END OF SESSION:  ? PT End of Session - 05/23/21 1507   ? ? Visit Number 6   ? Number of Visits 20   ? Date for PT Re-Evaluation 07/12/21   ? Authorization Type Medicare, [place]   ? Progress Note Due on Visit 10   ? PT Start Time 1430   ? PT Stop Time 1515   ? PT Time Calculation (min) 45 min   ? Activity Tolerance Patient tolerated treatment well   ? Behavior During Therapy [facility] for tasks assessed/performed   ? ?  ?  ? ?  ? ? ? ? ? ? ?Past Medical History:  ?Diagnosis Date  ? Arthritis   ? Hypothyroidism   ? Skin cancer   ? Forehead  ? Thyroid disease   ? Unilateral primary osteoarthritis, right knee 04/06/2021  ? ?Past Surgical History:  ?Procedure Laterality Date  ? Arm surgery Right   ? CATARACT EXTRACTION W/ INTRAOCULAR LENS IMPLANT Bilateral   ? KNEE ARTHROSCOPY Bilateral   ? TOTAL KNEE ARTHROPLASTY Right 04/07/2021  ? Procedure: Right TOTAL KNEE ARTHROPLASTY;  Surgeon: [name], MD;  Location: [facility];  Service: Orthopedics;  Laterality: Right;  ? ?Patient Active Problem List  ? Diagnosis Date Noted  ? Status post total right knee replacement 04/07/2021  ? Unilateral primary osteoarthritis, right knee 04/06/2021  ? Impingement syndrome of right shoulder 09/25/2016  ? Impingement syndrome of left shoulder 12/12/2015  ? ? ?REFERRING DIAG: Z96.651 (ICD-10-CM) - Status post total right knee replacement ? ?THERAPY DIAG:  ?Chronic pain of right knee ? ?Muscle weakness (generalized) ? ?Stiffness of right knee, not elsewhere classified ? ?Difficulty in walking, not elsewhere classified ? ?Localized edema ? ?PERTINENT HISTORY: Hypothyroidism ? ?PRECAUTIONS: None ? ?SUBJECTIVE: Pt arriving today reporting no pain at rest. Pt did report stiffness when first waking up.   ? ?PAIN:  ?Are you having pain?  no ?NPRS scale: 0/10 upon arrival  ?Pain location: Rt knee ?Pain description: aching, sharp ?Aggravating factors: stiffness c getting up ?Relieving factors: repositioning, HEP ? ? ? ? ?OBJECTIVE:  ?  ?PATIENT SURVEYS:  ?05/03/2021: FOTO intake: 61  predicted:  62 ?  ?Localized edema ?05/03/2021: noted upon inspection for Rt knee joint.  ?  ?LE ROM: ?  ?ROM ?P:passive  A:active  Right ?05/03/21 Right ?05/10/21 Right ?05/19/2021 Rt ?05/23/21  ?Hip flexion        ?Hip extension        ?Hip abduction        ?Hip adduction        ?Hip internal rotation        ?Hip external rotation        ?Knee flexion 101 AROM in supine heel slide  A: 102* ?supine AROM in supine heel slide: ?105 ? AROM ?Supine ?Heel slide ?112  ?Knee extension -5 AROM in seated LAQ A: -5* ?Seated LAQ  AROM in seated LAQ: ?-4 AROM  ?Supine ?-2  ?Ankle dorsiflexion        ?Ankle plantarflexion        ?Ankle inversion        ?Ankle eversion        ? (Blank rows = not tested) ?  ?LE MMT: ?  ?MMT Right ?05/03/2021 Left ?05/03/2021 Right ?05/19/2021  ?Hip  flexion 4/5 5/5   ?Hip extension       ?Hip abduction       ?Hip adduction       ?Hip internal rotation       ?Hip external rotation       ?Knee flexion 5/5 5/5   ?Knee extension 4/5 ?35, 31.4 lbs ?  5/5 ?48.6, 43.3 lbs 33 lbs, 32 lbs  ?Ankle dorsiflexion 5/5 5/5   ?Ankle plantarflexion       ?Ankle inversion       ?Ankle eversion       ? (Blank rows = not tested) ?  ?FUNCTIONAL TESTS:  ?05/03/2021 ?18 inch chair transfer : unable to perform without UE ?Lt SLS: 3 seconds    Rt SLS: < 2 seconds c assistance required ? ?GAIT: ?05/03/2021 ?Distance walked: Household distances within clinic < 150 ft c SPC ?Comments: Reduced stance on Rt LE, maintained knee flexion in stance, antalgic gait noted.  ?  ?  ?  ?TODAY'S TREATMENT: ?05/23/2021:  ?TherEx: ? UBE /bike LE only Lvl 1 full revolution 6 mins seat 12 ? Supine Rt heel slide AROM x 10 ? Incline board gastroc stretch 30 sec x 3 bilateral ? Leg  press Rt leg only 31 lbs 2 x 10  ? Machine double leg extension, Rt leg eccentric lowering 3 x 10 5 lbs ? ?Neuromuscular Re-ed: ?Standing on Airex mat feet apart, feet together x 30 c UE support ?Step tapping on 4 inch step alternating x 20 ?Side stepping x 15 feet each direction ? ?Vasopneumatic: ?Supine 34 deg medium compression 10 mins Rt leg ? ? ?05/19/2021 ?Therex: ? UBE LE only Lvl 1 full revolution 8 mins seat 12 ? Supine Rt heel slide AROM x 10 ? Incline board gastroc stretch 30 sec x 3 bilateral ? Leg press Rt leg only 25 lbs 2 x 10  ? Machine double leg extension, Rt leg eccentric lowering 3 x 10 5 lbs ? ?Neuromuscular Re-ed: ?Chuch pew ankle strategy fwd/back weight shifts without lifting foot x 7 each way ?Retro step x 15 bilateral   ? ?Vasopneumatic: ?Supine 34 deg medium compression 10 mins Rt leg ? ?05/15/2021 ?Therex: ? Recumbent bike partial circles 8 mins , seat 7 ? Incline board gastroc stretch 30 sec x 3 bilateral ? Leg press Double leg 50 lbs x 15, Rt leg only 25 lbs 2 x 10  ? Seated SAQ Rt full range c pause in end ranges x 15 5 lbs ? ?Neuromuscular Re-ed: ?Tandem stance on foam 1 min x 1 bilateral occasional hand assist on rail ?Alt toe/heel raise ankle strategy rockers x 15   ? ?Vasopneumatic: ?Supine 34 deg medium compression 10 mins Rt leg ? ?05/10/2021 ?Therex: ?     Aerobic: ?NuStep L7 with BLEs & BUEs for 6 min and LEs only level 4 for 2 min - total 8 min; seat 10 ? ?    Sitting: ?Seated quad set 10 x 5 sec hold; Rt ?Rt LAQ 3# & active knee flexion  2x10; 3# ?Sit to/from stand x 10 reps without UE support from 22" elevated height. PT demo & verbal cues on technique.  ? ?    Supine: ?Rt SLR x 10 reps - min cues for quad control ?AA heel slide rolling 55cm ball with BLE with strap RLE assist x 10 reps 5 sec hold flex & ext. ?Active heel slide x 10 reps; Rt ?SAQ 3# 2x10; Rt 5 sec hold ?Bridges x 10  reps ? ?Standing:  ?bil. Heel raises 10 reps with BUE support ?Step up 2" step with BUE support 10  reps ? ?Neuromuscular Re-ed: ?Tandem stance on foam beam: RLE in front & in back 30 sec 2 reps ea.  ? ?Vasopneumatic: ?Supine 34 deg medium compression 10 mins Rt leg ? ? ?  ?  ?PATIENT EDUCATION:  ?05/03/2021 ?Education details: HEP, POC ?Person educated: Patient ?Education method: Explanation, Demonstration, Verbal cues, and Handouts ?Education comprehension: verbalized understanding, returned demonstration, and verbal cues required ?  ?  ?HOME EXERCISE PROGRAM: ?05/03/2021  ?Access Code: [email or web address] ?URL: [email or web address] ?Date: 05/03/2021 ?Prepared by: Scot Jun ?  ?Exercises ?- Supine Heel Slide  - 3-5 x daily - 7 x weekly - 1-2 sets - 10 reps ?- Supine Heel Slide with Strap (Mirrored)  - 3-5 x daily - 7 x weekly - 1-2 sets - 10 reps - 5 hold ?- Supine Knee Extension Mobilization with Weight  - 1 x daily - 7 x weekly - 1 sets - 4 reps - to tolerance up to 15 mins hold ?- Seated Quad Set  - 3-5 x daily - 7 x weekly - 1 sets - 10 reps - 5 hold ?- Seated Knee Flexion Extension AAROM with Overpressure  - 3-5 x daily - 7 x weekly - 1 sets - 10 reps - 5 hold ?- Seated Long Arc Quad  - 3-5 x daily - 7 x weekly - 1 sets - 10 reps ?- Seated Straight Leg Heel Taps  - 2-3 x daily - 7 x weekly - 1 sets - 10-15 reps ?  ?ASSESSMENT: ?  ?CLINICAL IMPRESSION: ?Pt tolerating exercises well. Pt with mild increase in pain with end range flexion. Pt with AROM arc measured today -2 to 112 degrees. Pt still reporting difficulty getting out of car after sitting for more than 10-15 minutes.  Continue with skilled PT to maximize pt's function ? ? ?  ?OBJECTIVE IMPAIRMENTS Abnormal gait, decreased activity tolerance, decreased balance, decreased coordination, decreased endurance, decreased mobility, difficulty walking, decreased ROM, decreased strength, hypomobility, increased edema, impaired perceived functional ability, impaired flexibility, improper body mechanics, and pain.  ?  ?ACTIVITY LIMITATIONS cleaning,  community activity, driving, meal prep, occupation, and laundry.  ?  ?PERSONAL FACTORS Hypothyroidismare also affecting patient's functional outcome.  ?  ?REHAB POTENTIAL: Good ?  ?CLINICAL DECISION MAKING: St

## 2021-05-25 ENCOUNTER — Ambulatory Visit (INDEPENDENT_AMBULATORY_CARE_PROVIDER_SITE_OTHER): Payer: Medicare Other | Admitting: Rehabilitative and Restorative Service Providers"

## 2021-05-25 ENCOUNTER — Encounter: Payer: Self-pay | Admitting: Rehabilitative and Restorative Service Providers"

## 2021-05-25 DIAGNOSIS — M6281 Muscle weakness (generalized): Secondary | ICD-10-CM | POA: Diagnosis not present

## 2021-05-25 DIAGNOSIS — R600 Localized edema: Secondary | ICD-10-CM

## 2021-05-25 DIAGNOSIS — G8929 Other chronic pain: Secondary | ICD-10-CM

## 2021-05-25 DIAGNOSIS — M25561 Pain in right knee: Secondary | ICD-10-CM

## 2021-05-25 DIAGNOSIS — R262 Difficulty in walking, not elsewhere classified: Secondary | ICD-10-CM | POA: Diagnosis not present

## 2021-05-25 DIAGNOSIS — M25661 Stiffness of right knee, not elsewhere classified: Secondary | ICD-10-CM | POA: Diagnosis not present

## 2021-05-25 NOTE — Therapy (Signed)
?OUTPATIENT PHYSICAL THERAPY TREATMENT NOTE ? ? ?Patient Name: Brandy Delgado ?MRN: [id number] ?DOB:25-Jan-1945, 76 y.o., female ?Today's Date: 05/25/2021 ? ?PCP: Patient, No Pcp Per (Inactive) ?REFERRING PROVIDER: [name]* ? ?END OF SESSION:  ? PT End of Session - 05/25/21 1512   ? ? Visit Number 7   ? Number of Visits 20   ? Date for PT Re-Evaluation 07/12/21   ? Authorization Type Medicare, [place]   ? Progress Note Due on Visit 10   ? PT Start Time 1507   ? PT Stop Time 3382   ? PT Time Calculation (min) 50 min   ? Activity Tolerance Patient tolerated treatment well   ? Behavior During Therapy [facility] for tasks assessed/performed   ? ?  ?  ? ?  ? ? ? ? ? ? ? ?Past Medical History:  ?Diagnosis Date  ? Arthritis   ? Hypothyroidism   ? Skin cancer   ? Forehead  ? Thyroid disease   ? Unilateral primary osteoarthritis, right knee 04/06/2021  ? ?Past Surgical History:  ?Procedure Laterality Date  ? Arm surgery Right   ? CATARACT EXTRACTION W/ INTRAOCULAR LENS IMPLANT Bilateral   ? KNEE ARTHROSCOPY Bilateral   ? TOTAL KNEE ARTHROPLASTY Right 04/07/2021  ? Procedure: Right TOTAL KNEE ARTHROPLASTY;  Surgeon: [name], MD;  Location: [facility];  Service: Orthopedics;  Laterality: Right;  ? ?Patient Active Problem List  ? Diagnosis Date Noted  ? Status post total right knee replacement 04/07/2021  ? Unilateral primary osteoarthritis, right knee 04/06/2021  ? Impingement syndrome of right shoulder 09/25/2016  ? Impingement syndrome of left shoulder 12/12/2015  ? ? ?REFERRING DIAG: Z96.651 (ICD-10-CM) - Status post total right knee replacement ? ?THERAPY DIAG:  ?Chronic pain of right knee ? ?Muscle weakness (generalized) ? ?Stiffness of right knee, not elsewhere classified ? ?Difficulty in walking, not elsewhere classified ? ?Localized edema ? ?PERTINENT HISTORY: Hypothyroidism ? ?PRECAUTIONS: None ? ?SUBJECTIVE: Pt reported feeling some burning at night but overall doing ok.   ? ?PAIN:  ?Are you having  pain?  no ?NPRS scale: 0/10 upon arrival  ?Pain location: Rt knee ?Pain description: burning ?Aggravating factors: random nighttime ?Relieving factors: repositioning, HEP ? ? ? ? ?OBJECTIVE:  ?  ?PATIENT SURVEYS:  ?05/03/2021: FOTO intake: 61  predicted:  62 ?  ?Localized edema ?05/03/2021: noted upon inspection for Rt knee joint.  ?  ?LE ROM: ?  ?ROM ?P:passive  A:active  Right ?05/03/21 Right ?05/10/21 Right ?05/19/2021 Rt ?05/23/21  ?Hip flexion        ?Hip extension        ?Hip abduction        ?Hip adduction        ?Hip internal rotation        ?Hip external rotation        ?Knee flexion 101 AROM in supine heel slide  A: 102* ?supine AROM in supine heel slide: ?105 ? AROM ?Supine ?Heel slide ?112  ?Knee extension -5 AROM in seated LAQ A: -5* ?Seated LAQ  AROM in seated LAQ: ?-4 AROM  ?Supine ?-2  ?Ankle dorsiflexion        ?Ankle plantarflexion        ?Ankle inversion        ?Ankle eversion        ? (Blank rows = not tested) ?  ?LE MMT: ?  ?MMT Right ?05/03/2021 Left ?05/03/2021 Right ?05/19/2021  ?Hip flexion 4/5 5/5   ?Hip  extension       ?Hip abduction       ?Hip adduction       ?Hip internal rotation       ?Hip external rotation       ?Knee flexion 5/5 5/5   ?Knee extension 4/5 ?35, 31.4 lbs ?  5/5 ?48.6, 43.3 lbs 33 lbs, 32 lbs  ?Ankle dorsiflexion 5/5 5/5   ?Ankle plantarflexion       ?Ankle inversion       ?Ankle eversion       ? (Blank rows = not tested) ?  ?FUNCTIONAL TESTS:  ?05/03/2021 ?18 inch chair transfer : unable to perform without UE ?Lt SLS: 3 seconds    Rt SLS: < 2 seconds c assistance required ? ?GAIT: ?05/03/2021 ?Distance walked: Household distances within clinic < 150 ft c SPC ?Comments: Reduced stance on Rt LE, maintained knee flexion in stance, antalgic gait noted.  ?  ?  ?  ?TODAY'S TREATMENT: ?05/25/2021:  ?TherEx: ? UBE /bike LE only Lvl 2 full revolution 6 mins seat 12 ? Incline board gastroc stretch 30 sec x 3 bilateral ? Leg press Rt leg only 31 lbs 2 x 15  ? Seated LAQ pause in  flexion/extension Rt leg x 15 for ROM ? Step up c TKE blue band on Rt leg 4 inch x 15 bilateral hand assist on rail ? Supine Rt knee heel slide/SLR combo 2 x 15 ? Supine bridge x15 ? ?Neuromuscular Re-ed: ?Chuch pew ankle strategy fwd/back weight shifts without lifting foot on foam 2 mins c SBA ?Retro step on foam x 15 Rt leg posterior ? ?Vasopneumatic: ?Supine 34 deg medium compression 10 mins Rt leg ? ?05/23/2021:  ?TherEx: ? UBE /bike LE only Lvl 1 full revolution 6 mins seat 12 ? Supine Rt heel slide AROM x 10 ? Incline board gastroc stretch 30 sec x 3 bilateral ? Leg press Rt leg only 31 lbs 2 x 10  ? Machine double leg extension, Rt leg eccentric lowering 3 x 10 5 lbs ? ?Neuromuscular Re-ed: ?Standing on Airex mat feet apart, feet together x 30 c UE support ?Step tapping on 4 inch step alternating x 20 ?Side stepping x 15 feet each direction ? ?Vasopneumatic: ?Supine 34 deg medium compression 10 mins Rt leg ? ? ?05/19/2021 ?Therex: ? UBE LE only Lvl 1 full revolution 8 mins seat 12 ? Supine Rt heel slide AROM x 10 ? Incline board gastroc stretch 30 sec x 3 bilateral ? Leg press Rt leg only 25 lbs 2 x 10  ? Machine double leg extension, Rt leg eccentric lowering 3 x 10 5 lbs ? ?Neuromuscular Re-ed: ?Chuch pew ankle strategy fwd/back weight shifts without lifting foot x 7 each way ?Retro step x 15 bilateral   ? ?Vasopneumatic: ?Supine 34 deg medium compression 10 mins Rt leg ? ?05/15/2021 ?Therex: ? Recumbent bike partial circles 8 mins , seat 7 ? Incline board gastroc stretch 30 sec x 3 bilateral ? Leg press Double leg 50 lbs x 15, Rt leg only 25 lbs 2 x 10  ? Seated SAQ Rt full range c pause in end ranges x 15 5 lbs ? ?Neuromuscular Re-ed: ?Tandem stance on foam 1 min x 1 bilateral occasional hand assist on rail ?Alt toe/heel raise ankle strategy rockers x 15   ? ?Vasopneumatic: ?Supine 34 deg medium compression 10 mins Rt leg ? ?  ?  ?PATIENT EDUCATION:  ?05/03/2021 ?Education details: HEP, POC ?Person educated:  Patient ?Education method: Explanation, Demonstration, Verbal cues, and Handouts ?Education comprehension: verbalized understanding, returned demonstration, and verbal cues required ?  ?  ?HOME EXERCISE PROGRAM: ?05/03/2021  ?Access Code: [email or web address] ?URL: [email or web address] ?Date: 05/03/2021 ?Prepared by: Scot Jun ?  ?Exercises ?- Supine Heel Slide  - 3-5 x daily - 7 x weekly - 1-2 sets - 10 reps ?- Supine Heel Slide with Strap (Mirrored)  - 3-5 x daily - 7 x weekly - 1-2 sets - 10 reps - 5 hold ?- Supine Knee Extension Mobilization with Weight  - 1 x daily - 7 x weekly - 1 sets - 4 reps - to tolerance up to 15 mins hold ?- Seated Quad Set  - 3-5 x daily - 7 x weekly - 1 sets - 10 reps - 5 hold ?- Seated Knee Flexion Extension AAROM with Overpressure  - 3-5 x daily - 7 x weekly - 1 sets - 10 reps - 5 hold ?- Seated Long Arc Quad  - 3-5 x daily - 7 x weekly - 1 sets - 10 reps ?- Seated Straight Leg Heel Taps  - 2-3 x daily - 7 x weekly - 1 sets - 10-15 reps ?  ?ASSESSMENT: ?  ?CLINICAL IMPRESSION: ?Step up loading difficult on Rt leg but improved c repetitions due to improve muscular recruitment. Continued skilled PT services indicated to help progress functional strength and movement coordination control.  ?  ?OBJECTIVE IMPAIRMENTS Abnormal gait, decreased activity tolerance, decreased balance, decreased coordination, decreased endurance, decreased mobility, difficulty walking, decreased ROM, decreased strength, hypomobility, increased edema, impaired perceived functional ability, impaired flexibility, improper body mechanics, and pain.  ?  ?ACTIVITY LIMITATIONS cleaning, community activity, driving, meal prep, occupation, and laundry.  ?  ?PERSONAL FACTORS Hypothyroidismare also affecting patient's functional outcome.  ?  ?REHAB POTENTIAL: Good ?  ?CLINICAL DECISION MAKING: Stable/uncomplicated ?  ?EVALUATION COMPLEXITY: Low ?  ?  ?GOALS: ?Goals reviewed with patient? Yes ?  ?Short term PT Goals  (target date for Short term goals are 3 weeks 05/24/2021) ?Patient will demonstrate independent use of home exercise program to maintain progress from in clinic treatments. ?Goal status: MET - assessed 05/19/2021 ?  ?Long t

## 2021-05-30 ENCOUNTER — Ambulatory Visit (INDEPENDENT_AMBULATORY_CARE_PROVIDER_SITE_OTHER): Payer: Medicare Other | Admitting: Rehabilitative and Restorative Service Providers"

## 2021-05-30 ENCOUNTER — Encounter: Payer: Self-pay | Admitting: Rehabilitative and Restorative Service Providers"

## 2021-05-30 DIAGNOSIS — G8929 Other chronic pain: Secondary | ICD-10-CM

## 2021-05-30 DIAGNOSIS — M25661 Stiffness of right knee, not elsewhere classified: Secondary | ICD-10-CM | POA: Diagnosis not present

## 2021-05-30 DIAGNOSIS — M25561 Pain in right knee: Secondary | ICD-10-CM

## 2021-05-30 DIAGNOSIS — R600 Localized edema: Secondary | ICD-10-CM

## 2021-05-30 DIAGNOSIS — M6281 Muscle weakness (generalized): Secondary | ICD-10-CM

## 2021-05-30 DIAGNOSIS — R262 Difficulty in walking, not elsewhere classified: Secondary | ICD-10-CM

## 2021-05-30 NOTE — Therapy (Signed)
?OUTPATIENT PHYSICAL THERAPY TREATMENT NOTE ? ? ?Patient Name: Brandy Delgado ?MRN: [id number] ?DOB:[birth year], 76 y.o., female ?Today's Date: 05/30/2021 ? ?PCP: Patient, No Pcp Per (Inactive) ?REFERRING PROVIDER: [name]* ? ?END OF SESSION:  ? PT End of Session - 05/30/21 1511   ? ? Visit Number 8   ? Number of Visits 20   ? Date for PT Re-Evaluation 07/12/21   ? Authorization Type Medicare, [place]   ? Progress Note Due on Visit 10   ? PT Start Time 1508   ? PT Stop Time 1558   ? PT Time Calculation (min) 50 min   ? Activity Tolerance Patient tolerated treatment well   ? Behavior During Therapy [facility] for tasks assessed/performed   ? ?  ?  ? ?  ? ? ? ? ? ? ? ? ?Past Medical History:  ?Diagnosis Date  ? Arthritis   ? Hypothyroidism   ? Skin cancer   ? Forehead  ? Thyroid disease   ? Unilateral primary osteoarthritis, right knee 04/06/2021  ? ?Past Surgical History:  ?Procedure Laterality Date  ? Arm surgery Right   ? CATARACT EXTRACTION W/ INTRAOCULAR LENS IMPLANT Bilateral   ? KNEE ARTHROSCOPY Bilateral   ? TOTAL KNEE ARTHROPLASTY Right 04/07/2021  ? Procedure: Right TOTAL KNEE ARTHROPLASTY;  Surgeon: [name], MD;  Location: [facility];  Service: Orthopedics;  Laterality: Right;  ? ?Patient Active Problem List  ? Diagnosis Date Noted  ? Status post total right knee replacement 04/07/2021  ? Unilateral primary osteoarthritis, right knee 04/06/2021  ? Impingement syndrome of right shoulder 09/25/2016  ? Impingement syndrome of left shoulder 12/12/2015  ? ? ?REFERRING DIAG: Z96.651 (ICD-10-CM) - Status post total right knee replacement ? ?THERAPY DIAG:  ?Chronic pain of right knee ? ?Muscle weakness (generalized) ? ?Stiffness of right knee, not elsewhere classified ? ?Difficulty in walking, not elsewhere classified ? ?Localized edema ? ?PERTINENT HISTORY: Hypothyroidism ? ?PRECAUTIONS: None ? ?SUBJECTIVE:   Pt indicated 3/10 complaints of steady achy upon waking today.  Reported doing  standing/walking throughout the day without improvement.  ? ?PAIN:  ?Are you having pain?  no ?NPRS scale: 0/10 upon arrival  ?Pain location: Rt knee ?Pain description: burning ?Aggravating factors: random nighttime ?Relieving factors: repositioning, HEP ? ? ? ? ?OBJECTIVE:  ?  ?PATIENT SURVEYS:  ?05/30/2021:  FOTO update: 66% ? ?05/03/2021: FOTO intake: 61  predicted:  62 ?  ?Localized edema ?05/03/2021: noted upon inspection for Rt knee joint.  ?  ?LE ROM: ?  ?ROM ?P:passive  A:active  Right ?05/03/21 Right ?05/10/21 Right ?05/19/2021 Rt ?05/23/21  ?Hip flexion        ?Hip extension        ?Hip abduction        ?Hip adduction        ?Hip internal rotation        ?Hip external rotation        ?Knee flexion 101 AROM in supine heel slide  A: 102* ?supine AROM in supine heel slide: ?105 ? AROM ?Supine ?Heel slide ?112  ?Knee extension -5 AROM in seated LAQ A: -5* ?Seated LAQ  AROM in seated LAQ: ?-4 AROM  ?Supine ?-2  ?Ankle dorsiflexion        ?Ankle plantarflexion        ?Ankle inversion        ?Ankle eversion        ? (Blank rows = not tested) ?  ?LE MMT: ?  ?  MMT Right ?05/03/2021 Left ?05/03/2021 Right ?05/19/2021  ?Hip flexion 4/5 5/5   ?Hip extension       ?Hip abduction       ?Hip adduction       ?Hip internal rotation       ?Hip external rotation       ?Knee flexion 5/5 5/5   ?Knee extension 4/5 ?35, 31.4 lbs ?  5/5 ?48.6, 43.3 lbs 33 lbs, 32 lbs  ?Ankle dorsiflexion 5/5 5/5   ?Ankle plantarflexion       ?Ankle inversion       ?Ankle eversion       ? (Blank rows = not tested) ?  ?FUNCTIONAL TESTS:  ?05/03/2021 ?18 inch chair transfer : unable to perform without UE ?Lt SLS: 3 seconds    Rt SLS: < 2 seconds c assistance required ? ?GAIT: ?05/30/2021: Able to ambulate independently in clinic.  ? ?05/03/2021 ?Distance walked: Household distances within clinic < 150 ft c SPC ?Comments: Reduced stance on Rt LE, maintained knee flexion in stance, antalgic gait noted.  ?  ?  ?  ?TODAY'S TREATMENT: ?05/30/2021:  ?TherEx: ? UBE LE only  Lvl 2 full revolution 8 mins seat 12 ? Leg press Rt leg only 31 lbs 2 x 15  ? Seated LAQ pause in flexion/extension Rt leg 2 x 15 c 5 lb  ? Supine Rt knee heel slide/SLR combo 2 x 10 ? Supine bridge x15 ? ?Neuromuscular Re-ed: ?Chuch pew ankle strategy fwd/back weight shifts without lifting foot on foam 2 mins c SBA ?Tandem stance on foam 1 min x 1 bilateral ? ?Vasopneumatic: ?Supine 34 deg medium compression 10 mins Rt leg ? ?05/25/2021:  ?TherEx: ? UBE /bike LE only Lvl 2 full revolution 6 mins seat 12 ? Incline board gastroc stretch 30 sec x 3 bilateral ? Leg press Rt leg only 31 lbs 2 x 15  ? Seated LAQ pause in flexion/extension Rt leg x 15 for ROM ? Step up c TKE blue band on Rt leg 4 inch x 15 bilateral hand assist on rail ? Supine Rt knee heel slide/SLR combo 2 x 15 ? Supine bridge x15 ? ?Neuromuscular Re-ed: ?Chuch pew ankle strategy fwd/back weight shifts without lifting foot on foam 2 mins c SBA ?Retro step on foam x 15 Rt leg posterior ? ?Vasopneumatic: ?Supine 34 deg medium compression 10 mins Rt leg ? ?05/23/2021:  ?TherEx: ? UBE /bike LE only Lvl 1 full revolution 6 mins seat 12 ? Supine Rt heel slide AROM x 10 ? Incline board gastroc stretch 30 sec x 3 bilateral ? Leg press Rt leg only 31 lbs 2 x 10  ? Machine double leg extension, Rt leg eccentric lowering 3 x 10 5 lbs ? ?Neuromuscular Re-ed: ?Standing on Airex mat feet apart, feet together x 30 c UE support ?Step tapping on 4 inch step alternating x 20 ?Side stepping x 15 feet each direction ? ?Vasopneumatic: ?Supine 34 deg medium compression 10 mins Rt leg ? ? ?PATIENT EDUCATION:  ?05/03/2021 ?Education details: HEP, POC ?Person educated: Patient ?Education method: Explanation, Demonstration, Verbal cues, and Handouts ?Education comprehension: verbalized understanding, returned demonstration, and verbal cues required ?  ?  ?HOME EXERCISE PROGRAM: ?05/03/2021  ?Access Code: [email or web address] ?URL: [email or web address] ?Date: 05/03/2021 ?Prepared  by: Scot Jun ?  ?Exercises ?- Supine Heel Slide  - 3-5 x daily - 7 x weekly - 1-2 sets - 10 reps ?- Supine Heel Slide with Strap (Mirrored)  - 3-5 x  daily - 7 x weekly - 1-2 sets - 10 reps - 5 hold ?- Supine Knee Extension Mobilization with Weight  - 1 x daily - 7 x weekly - 1 sets - 4 reps - to tolerance up to 15 mins hold ?- Seated Quad Set  - 3-5 x daily - 7 x weekly - 1 sets - 10 reps - 5 hold ?- Seated Knee Flexion Extension AAROM with Overpressure  - 3-5 x daily - 7 x weekly - 1 sets - 10 reps - 5 hold ?- Seated Long Arc Quad  - 3-5 x daily - 7 x weekly - 1 sets - 10 reps ?- Seated Straight Leg Heel Taps  - 2-3 x daily - 7 x weekly - 1 sets - 10-15 reps ?  ?ASSESSMENT: ?  ?CLINICAL IMPRESSION: ?Step up continued to show difficulty c discomfort today (decision to hold for today).  Continued focus on quad strengthening to improve control in WB activity with goals of progression to WB loading as tolerated.  ?  ?OBJECTIVE IMPAIRMENTS Abnormal gait, decreased activity tolerance, decreased balance, decreased coordination, decreased endurance, decreased mobility, difficulty walking, decreased ROM, decreased strength, hypomobility, increased edema, impaired perceived functional ability, impaired flexibility, improper body mechanics, and pain.  ?  ?ACTIVITY LIMITATIONS cleaning, community activity, driving, meal prep, occupation, and laundry.  ?  ?PERSONAL FACTORS Hypothyroidismare also affecting patient's functional outcome.  ?  ?REHAB POTENTIAL: Good ?  ?CLINICAL DECISION MAKING: Stable/uncomplicated ?  ?EVALUATION COMPLEXITY: Low ?  ?  ?GOALS: ?Goals reviewed with patient? Yes ?  ?Short term PT Goals (target date for Short term goals are 3 weeks 05/24/2021) ?Patient will demonstrate independent use of home exercise program to maintain progress from in clinic treatments. ?Goal status: MET - assessed 05/19/2021 ?  ?Long term PT goals (target dates for all long term goals are 10 weeks  07/12/2021 ) ?  ?1. Patient  will demonstrate/report pain at worst less than or equal to 2/10 to facilitate minimal limitation in daily activity secondary to pain symptoms. ?Goal status: on going - assessed 05/19/2021 ?  ?2. Patient will demonstr

## 2021-06-01 ENCOUNTER — Ambulatory Visit (INDEPENDENT_AMBULATORY_CARE_PROVIDER_SITE_OTHER): Payer: Medicare Other | Admitting: Rehabilitative and Restorative Service Providers"

## 2021-06-01 ENCOUNTER — Encounter: Payer: Self-pay | Admitting: Rehabilitative and Restorative Service Providers"

## 2021-06-01 DIAGNOSIS — M25661 Stiffness of right knee, not elsewhere classified: Secondary | ICD-10-CM | POA: Diagnosis not present

## 2021-06-01 DIAGNOSIS — G8929 Other chronic pain: Secondary | ICD-10-CM

## 2021-06-01 DIAGNOSIS — R262 Difficulty in walking, not elsewhere classified: Secondary | ICD-10-CM | POA: Diagnosis not present

## 2021-06-01 DIAGNOSIS — M6281 Muscle weakness (generalized): Secondary | ICD-10-CM

## 2021-06-01 DIAGNOSIS — R600 Localized edema: Secondary | ICD-10-CM

## 2021-06-01 DIAGNOSIS — M25561 Pain in right knee: Secondary | ICD-10-CM

## 2021-06-01 NOTE — Therapy (Signed)
OUTPATIENT PHYSICAL THERAPY TREATMENT NOTE/PROGRESS NOTE   Patient Name: Brandy Delgado MRN: [id number] DOB:1945-03-07, 76 y.o., female Today's Date: 06/01/2021  PCP: Patient, No Pcp Per (Inactive) REFERRING PROVIDER: [name]*  Progress Note Reporting Period 05/03/2021 to 06/01/2021  See note below for Objective Data and Assessment of Progress/Goals.    END OF SESSION:   PT End of Session - 06/01/21 1403     Visit Number 9    Number of Visits 20    Date for PT Re-Evaluation 07/12/21    Authorization Type Medicare, [place]    Progress Note Due on Visit 19    PT Start Time 1427    PT Stop Time 1517    PT Time Calculation (min) 50 min    Activity Tolerance Patient tolerated treatment well    Behavior During Therapy WFL for tasks assessed/performed             Past Medical History:  Diagnosis Date   Arthritis    Hypothyroidism    Skin cancer    Forehead   Thyroid disease    Unilateral primary osteoarthritis, right knee 04/06/2021   Past Surgical History:  Procedure Laterality Date   Arm surgery Right    CATARACT EXTRACTION W/ INTRAOCULAR LENS IMPLANT Bilateral    KNEE ARTHROSCOPY Bilateral    TOTAL KNEE ARTHROPLASTY Right 04/07/2021   Procedure: Right TOTAL KNEE ARTHROPLASTY;  Surgeon: [name], MD;  Location: [facility];  Service: Orthopedics;  Laterality: Right;   Patient Active Problem List   Diagnosis Date Noted   Status post total right knee replacement 04/07/2021   Unilateral primary osteoarthritis, right knee 04/06/2021   Impingement syndrome of right shoulder 09/25/2016   Impingement syndrome of left shoulder 12/12/2015    REFERRING DIAG: Z96.651 (ICD-10-CM) - Status post total right knee replacement  THERAPY DIAG:  Chronic pain of right knee  Muscle weakness (generalized)  Stiffness of right knee, not elsewhere classified  Difficulty in walking, not elsewhere classified  Localized edema  PERTINENT HISTORY:  Hypothyroidism  PRECAUTIONS: None  SUBJECTIVE:   Pt indicated doing better.  Stated she walked to mailbox without cane.  (Reported 10 mins each way).   Rated pain at worst 3-4/10 in last week.   PAIN:  Are you having pain?  no NPRS scale: 0/10 upon arrival  Pain location: Rt knee Pain description:  Aggravating factors: random nighttime, prolonged standing at times Relieving factors: repositioning, HEP     OBJECTIVE:    PATIENT SURVEYS:  05/30/2021:  FOTO update: 66%  05/03/2021: FOTO intake: 61  predicted:  62   Localized edema 05/03/2021: noted upon inspection for Rt knee joint.    LE ROM:   ROM P:passive  A:active  Right 05/03/21 Right 05/10/21 Right 05/19/2021 Rt 05/23/21 Right 06/01/2021  Hip flexion         Hip extension         Hip abduction         Hip adduction         Hip internal rotation         Hip external rotation         Knee flexion 101 AROM in supine heel slide  A: 102* supine AROM in supine heel slide: 105  AROM Supine Heel slide 112 AROM in supine heel slide 115  Knee extension -5 AROM in seated LAQ A: -5* Seated LAQ  AROM in seated LAQ: -4 AROM  Supine -2 AROM in seated LAQ -3  Ankle dorsiflexion         Ankle plantarflexion         Ankle inversion         Ankle eversion          (Blank rows = not tested)   LE MMT:   MMT Right 05/03/2021 Left 05/03/2021 Right 05/19/2021 Right 06/01/2021  Hip flexion 4/5 5/5    Hip extension        Hip abduction        Hip adduction        Hip internal rotation        Hip external rotation        Knee flexion 5/5 5/5  5/5  Knee extension 4/5 35, 31.4 lbs   5/5 48.6, 43.3 lbs 33 lbs, 32 lbs 5/5 40.5, 39 lbs  Ankle dorsiflexion 5/5 5/5    Ankle plantarflexion        Ankle inversion        Ankle eversion         (Blank rows = not tested)   FUNCTIONAL TESTS:  06/01/2021:  18 inch chair transfer: able to perform s UE on 1st try c visible increased difficulty.   Rt SLS:   05/03/2021 18 inch chair  transfer : unable to perform without UE Lt SLS: 3 seconds    Rt SLS: < 2 seconds c assistance required  GAIT: 05/30/2021: Able to ambulate independently in clinic.   05/03/2021 Distance walked: Household distances within clinic < 150 ft c SPC Comments: Reduced stance on Rt LE, maintained knee flexion in stance, antalgic gait noted.        TODAY'S TREATMENT: 06/01/2021:  TherEx:  UBE LE only Lvl 2.5 full revolution 10 mins seat 12  Machine LAQ double leg up, Rt leg lowering 3 x 10 5 lbs (slot 3 from back)  Sit to stand to sit 19 inch chair x 10  Step up on Rt leg 2 inch x 10  no UE assist  Supine bridge 5 sec hold x 15  Neuro re-ed  Tandem ambulation fwd on foam in // bars 8 ft x 10   Lateral side step on foam in // bars 8 ft x 5 each way   Vasopneumatic: Supine 34 deg medium compression 10 mins Rt leg  05/30/2021:  TherEx:  UBE LE only Lvl 2 full revolution 8 mins seat 12  Leg press Rt leg only 31 lbs 2 x 15   Seated LAQ pause in flexion/extension Rt leg 2 x 15 c 5 lb   Supine Rt knee heel slide/SLR combo 2 x 10  Supine bridge x15  Neuromuscular Re-ed: Chuch pew ankle strategy fwd/back weight shifts without lifting foot on foam 2 mins c SBA Tandem stance on foam 1 min x 1 bilateral  Vasopneumatic: Supine 34 deg medium compression 10 mins Rt leg  05/25/2021:  TherEx:  UBE /bike LE only Lvl 2 full revolution 6 mins seat 12  Incline board gastroc stretch 30 sec x 3 bilateral  Leg press Rt leg only 31 lbs 2 x 15   Seated LAQ pause in flexion/extension Rt leg x 15 for ROM  Step up c TKE blue band on Rt leg 4 inch x 15 bilateral hand assist on rail  Supine Rt knee heel slide/SLR combo 2 x 15  Supine bridge x15  Neuromuscular Re-ed: Chuch pew ankle strategy fwd/back weight shifts without lifting foot on foam 2 mins c SBA Retro step on foam x  15 Rt leg posterior  Vasopneumatic: Supine 34 deg medium compression 10 mins Rt leg  PATIENT EDUCATION:  05/03/2021 Education  details: HEP, POC Person educated: Patient Education method: Consulting civil engineer, Demonstration, Verbal cues, and Handouts Education comprehension: verbalized understanding, returned demonstration, and verbal cues required     HOME EXERCISE PROGRAM: 05/03/2021  Access Code: [email or web address] URL: [email or web address] Date: 05/03/2021 Prepared by: Scot Jun   Exercises - Supine Heel Slide  - 3-5 x daily - 7 x weekly - 1-2 sets - 10 reps - Supine Heel Slide with Strap (Mirrored)  - 3-5 x daily - 7 x weekly - 1-2 sets - 10 reps - 5 hold - Supine Knee Extension Mobilization with Weight  - 1 x daily - 7 x weekly - 1 sets - 4 reps - to tolerance up to 15 mins hold - Seated Quad Set  - 3-5 x daily - 7 x weekly - 1 sets - 10 reps - 5 hold - Seated Knee Flexion Extension AAROM with Overpressure  - 3-5 x daily - 7 x weekly - 1 sets - 10 reps - 5 hold - Seated Long Arc Quad  - 3-5 x daily - 7 x weekly - 1 sets - 10 reps - Seated Straight Leg Heel Taps  - 2-3 x daily - 7 x weekly - 1 sets - 10-15 reps   ASSESSMENT:   CLINICAL IMPRESSION: Pt has attended 9 visits to this point in treatment cycle.  Reporting symptoms 3-4/10.  See objective data for updated information.  Gains noted in mobility, strength and movement coordination.  Continued improvements in strength and movement coordination to help improve functional movement and reach all established goals.    OBJECTIVE IMPAIRMENTS Abnormal gait, decreased activity tolerance, decreased balance, decreased coordination, decreased endurance, decreased mobility, difficulty walking, decreased ROM, decreased strength, hypomobility, increased edema, impaired perceived functional ability, impaired flexibility, improper body mechanics, and pain.    ACTIVITY LIMITATIONS cleaning, community activity, driving, meal prep, occupation, and laundry.    PERSONAL FACTORS Hypothyroidismare also affecting patient's functional outcome.    REHAB POTENTIAL: Good    CLINICAL DECISION MAKING: Stable/uncomplicated   EVALUATION COMPLEXITY: Low     GOALS: Goals reviewed with patient? Yes   Short term PT Goals (target date for Short term goals are 3 weeks 05/24/2021) Patient will demonstrate independent use of home exercise program to maintain progress from in clinic treatments. Goal status: MET - assessed 05/19/2021   Long term PT goals (target dates for all long term goals are 10 weeks  07/12/2021 )   1. Patient will demonstrate/report pain at worst less than or equal to 2/10 to facilitate minimal limitation in daily activity secondary to pain symptoms. Goal status: on going - assessed 06/01/2021   2. Patient will demonstrate independent use of home exercise program to facilitate ability to maintain/progress functional gains from skilled physical therapy services. Goal status: on going - assessed 06/01/2021   3. Patient will demonstrate FOTO outcome > or = 62 % to indicate reduced disability due to condition. Goal status: on going - assessed 06/01/2021   4.  Patient will demonstrate Rt knee AROM 0-110 degrees to facilitate ability to perform transfers, sitting, ambulation, stair navigation s restriction due to mobility. Goal status: on going - assessed 06/01/2021   5.  Patient will demonstrate Rt LE MMT 5/5, dynamometry with 15 % of Lt knee extension to facilitate ability to perform usual standing, walking, stairs at PLOF s limitation due to symptoms.   Goal status:  on going - assessed 06/01/2021   6.  Patient will demonstrate independent ambulation community distances > 300 ft to facilitate community integration at [facility].   Goal status: MET - assessed 06/01/2021   7.  Patient will demonstrate reciprocal gait pattern on stairs up/down s UE assist.  a.  Goal Status:on going - assessed 06/01/2021     PLAN: PT FREQUENCY: 2x/week   PT DURATION: 10 weeks   PLANNED INTERVENTIONS: Therapeutic exercises, Therapeutic activity, Neuro Muscular re-education,  Balance training, Gait training, Patient/Family education, Joint mobilization, Stair training, DME instructions, Dry Needling, Electrical stimulation, Cryotherapy, Moist heat, Taping, Ultrasound, Ionotophoresis 41m/ml Dexamethasone, and Manual therapy.  All included unless contraindicated   PLAN FOR NEXT SESSION:  Continue to address strength and improve functional stair strength.    MScot Jun PT, DPT, OCS, ATC 06/01/21  3:03 PM

## 2021-06-05 ENCOUNTER — Ambulatory Visit (INDEPENDENT_AMBULATORY_CARE_PROVIDER_SITE_OTHER): Payer: Medicare Other | Admitting: Orthopaedic Surgery

## 2021-06-05 DIAGNOSIS — Z96651 Presence of right artificial knee joint: Secondary | ICD-10-CM

## 2021-06-05 NOTE — Progress Notes (Signed)
The patient is now 2 months status post a right total knee arthroplasty.  She is 76 years old and very active.  She is back to driving.  She is in physical therapy just to really work on her balance and coordination.  She is really push herself hard and is very pleased with her therapist Legrand Como upstairs and sings his praises.  She does have therapy going into June.  She is walking without a cane today but she does still have a limp to be expected.  Her extension is full and her flexion is to past 110 degrees.  There is some swelling to be expected with her knee at the same postoperative and she understands it will be swollen and warm for a while.  Her knee feels ligamentously stable.  I am very pleased with her progress and what she is done pushing yourself along and I am pleased with her therapist as well.  I would like to see her back in 6 months for an AP and lateral of her right knee.  If there is any issues before then she knows to let us know.

## 2021-06-14 ENCOUNTER — Ambulatory Visit (INDEPENDENT_AMBULATORY_CARE_PROVIDER_SITE_OTHER): Payer: Medicare Other | Admitting: Rehabilitative and Restorative Service Providers"

## 2021-06-14 ENCOUNTER — Encounter: Payer: Self-pay | Admitting: Rehabilitative and Restorative Service Providers"

## 2021-06-14 DIAGNOSIS — G8929 Other chronic pain: Secondary | ICD-10-CM

## 2021-06-14 DIAGNOSIS — M25561 Pain in right knee: Secondary | ICD-10-CM | POA: Diagnosis not present

## 2021-06-14 DIAGNOSIS — R262 Difficulty in walking, not elsewhere classified: Secondary | ICD-10-CM | POA: Diagnosis not present

## 2021-06-14 DIAGNOSIS — M6281 Muscle weakness (generalized): Secondary | ICD-10-CM

## 2021-06-14 DIAGNOSIS — R600 Localized edema: Secondary | ICD-10-CM

## 2021-06-14 DIAGNOSIS — M25661 Stiffness of right knee, not elsewhere classified: Secondary | ICD-10-CM | POA: Diagnosis not present

## 2021-06-14 NOTE — Therapy (Signed)
OUTPATIENT PHYSICAL THERAPY TREATMENT NOTE   Patient Name: Brandy Delgado MRN: [id number] DOB:[birth year], 76 y.o., female Today's Date: 06/14/2021  PCP: Patient, No Pcp Per (Inactive) REFERRING PROVIDER: [name]*  END OF SESSION:   PT End of Session - 06/14/21 1429     Visit Number 10    Number of Visits 20    Date for PT Re-Evaluation 07/12/21    Authorization Type Medicare, [employer] [place]    Progress Note Due on Visit 22    PT Start Time 1427    PT Stop Time 1516    PT Time Calculation (min) 49 min    Activity Tolerance Patient tolerated treatment well    Behavior During Therapy [facility] for tasks assessed/performed              Past Medical History:  Diagnosis Date   Arthritis    Hypothyroidism    Skin cancer    Forehead   Thyroid disease    Unilateral primary osteoarthritis, right knee 04/06/2021   Past Surgical History:  Procedure Laterality Date   Arm surgery Right    CATARACT EXTRACTION W/ INTRAOCULAR LENS IMPLANT Bilateral    KNEE ARTHROSCOPY Bilateral    TOTAL KNEE ARTHROPLASTY Right 04/07/2021   Procedure: Right TOTAL KNEE ARTHROPLASTY;  Surgeon: [name], MD;  Location: [facility];  Service: Orthopedics;  Laterality: Right;   Patient Active Problem List   Diagnosis Date Noted   Status post total right knee replacement 04/07/2021   Unilateral primary osteoarthritis, right knee 04/06/2021   Impingement syndrome of right shoulder 09/25/2016   Impingement syndrome of left shoulder 12/12/2015    REFERRING DIAG: Z96.651 (ICD-10-CM) - Status post total right knee replacement  THERAPY DIAG:  Chronic pain of right knee  Muscle weakness (generalized)  Stiffness of right knee, not elsewhere classified  Difficulty in walking, not elsewhere classified  Localized edema  PERTINENT HISTORY: Hypothyroidism  PRECAUTIONS: None  SUBJECTIVE:   Pt indicated feeling like she can walk better.  Pt indicated some tightness here and there.  No  pain upon arrival today.   PAIN:  Are you having pain?  no NPRS scale: 0/10 upon arrival  Pain location: Rt knee Pain description:  Aggravating factors: tightness c inactivity Relieving factors: repositioning, HEP     OBJECTIVE:    PATIENT SURVEYS:  05/30/2021:  FOTO update: 66%  05/03/2021: FOTO intake: 61  predicted:  62   Localized edema 05/03/2021: noted upon inspection for Rt knee joint.    LE ROM:   ROM P:passive  A:active  Right 05/03/21 Right 05/10/21 Right 05/19/2021 Rt 05/23/21 Right 06/01/2021  Hip flexion         Hip extension         Hip abduction         Hip adduction         Hip internal rotation         Hip external rotation         Knee flexion 101 AROM in supine heel slide  A: 102* supine AROM in supine heel slide: 105  AROM Supine Heel slide 112 AROM in supine heel slide 115  Knee extension -5 AROM in seated LAQ A: -5* Seated LAQ  AROM in seated LAQ: -4 AROM  Supine -2 AROM in seated LAQ -3  Ankle dorsiflexion         Ankle plantarflexion         Ankle inversion  Ankle eversion          (Blank rows = not tested)   LE MMT:   MMT Right 05/03/2021 Left 05/03/2021 Right 05/19/2021 Right 06/01/2021 Right 06/14/2021  Hip flexion 4/5 5/5     Hip extension         Hip abduction         Hip adduction         Hip internal rotation         Hip external rotation         Knee flexion 5/5 5/5  5/5   Knee extension 4/5 35, 31.4 lbs   5/5 48.6, 43.3 lbs 33 lbs, 32 lbs 5/5 40.5, 39 lbs 5/5 42.3, 42.3 lbs  Ankle dorsiflexion 5/5 5/5     Ankle plantarflexion         Ankle inversion         Ankle eversion          (Blank rows = not tested)   FUNCTIONAL TESTS:  06/14/2021: Rt SLS: 4 seconds  06/01/2021:  18 inch chair transfer: able to perform s UE on 1st try c visible increased difficulty.     05/03/2021 18 inch chair transfer : unable to perform without UE Lt SLS: 3 seconds    Rt SLS: < 2 seconds c assistance  required  GAIT: 05/30/2021: Able to ambulate independently in clinic.   05/03/2021 Distance walked: Household distances within clinic < 150 ft c SPC Comments: Reduced stance on Rt LE, maintained knee flexion in stance, antalgic gait noted.        TODAY'S TREATMENT: 06/14/2021:  TherEx:  UBE LE only Lvl 3.0 full revolution 8 mins seat 12  Machine LAQ double leg up, Rt leg lowering 3 x 10 10 lbs (slot 3 from back) additional time spent due to slow rep speed focus  Seated SLR slow focus x 15 Rt leg  2 inch step down c hip hike at top bilateral x 15 (3 sec hold) bilateral UE light touch on bar  Neuro re-ed  Tandem ambulation on foam in // bars fwd 8 ft x 10   Tandem stance on foam 1 min x 2 bilateral   Vasopneumatic: Supine 34 deg medium compression 10 mins Rt leg  06/01/2021:  TherEx:  UBE LE only Lvl 2.5 full revolution 10 mins seat 12  Machine LAQ double leg up, Rt leg lowering 3 x 10 5 lbs (slot 3 from back)  Sit to stand to sit 19 inch chair x 10  Step up on Rt leg 2 inch x 10  no UE assist  Supine bridge 5 sec hold x 15  Neuro re-ed  Tandem ambulation fwd on foam in // bars 8 ft x 10   Lateral side step on foam in // bars 8 ft x 5 each way   Vasopneumatic: Supine 34 deg medium compression 10 mins Rt leg  05/30/2021:  TherEx:  UBE LE only Lvl 2 full revolution 8 mins seat 12  Leg press Rt leg only 31 lbs 2 x 15   Seated LAQ pause in flexion/extension Rt leg 2 x 15 c 5 lb   Supine Rt knee heel slide/SLR combo 2 x 10  Supine bridge x15  Neuromuscular Re-ed: Chuch pew ankle strategy fwd/back weight shifts without lifting foot on foam 2 mins c SBA Tandem stance on foam 1 min x 1 bilateral  Vasopneumatic: Supine 34 deg medium compression 10 mins Rt leg   PATIENT EDUCATION:  05/03/2021 Education details: HEP, POC Person educated: Patient Education method: Consulting civil engineer, Demonstration, Verbal cues, and Handouts Education comprehension: verbalized understanding,  returned demonstration, and verbal cues required     HOME EXERCISE PROGRAM: 05/03/2021  Access Code: [email or web address] URL: [email or web address] Date: 05/03/2021 Prepared by: Scot Jun   Exercises - Supine Heel Slide  - 3-5 x daily - 7 x weekly - 1-2 sets - 10 reps - Supine Heel Slide with Strap (Mirrored)  - 3-5 x daily - 7 x weekly - 1-2 sets - 10 reps - 5 hold - Supine Knee Extension Mobilization with Weight  - 1 x daily - 7 x weekly - 1 sets - 4 reps - to tolerance up to 15 mins hold - Seated Quad Set  - 3-5 x daily - 7 x weekly - 1 sets - 10 reps - 5 hold - Seated Knee Flexion Extension AAROM with Overpressure  - 3-5 x daily - 7 x weekly - 1 sets - 10 reps - 5 hold - Seated Long Arc Quad  - 3-5 x daily - 7 x weekly - 1 sets - 10 reps - Seated Straight Leg Heel Taps  - 2-3 x daily - 7 x weekly - 1 sets - 10-15 reps   ASSESSMENT:   CLINICAL IMPRESSION: Pt demonstrated continued improvement in quality of ambulation independently.  Some mild instances of instability in mid stance SLS control (continued improvement to benefit). Poor control in lateral step down observed today. Pt to benefit from continued progressive strengthening plan c dynamic and compliant surface balance control to improve functional movement.    OBJECTIVE IMPAIRMENTS Abnormal gait, decreased activity tolerance, decreased balance, decreased coordination, decreased endurance, decreased mobility, difficulty walking, decreased ROM, decreased strength, hypomobility, increased edema, impaired perceived functional ability, impaired flexibility, improper body mechanics, and pain.    ACTIVITY LIMITATIONS cleaning, community activity, driving, meal prep, occupation, and laundry.    PERSONAL FACTORS Hypothyroidismare also affecting patient's functional outcome.    REHAB POTENTIAL: Good   CLINICAL DECISION MAKING: Stable/uncomplicated   EVALUATION COMPLEXITY: Low     GOALS: Goals reviewed with patient? Yes    Short term PT Goals (target date for Short term goals are 3 weeks 05/24/2021) Patient will demonstrate independent use of home exercise program to maintain progress from in clinic treatments. Goal status: MET - assessed 05/19/2021   Long term PT goals (target dates for all long term goals are 10 weeks  07/12/2021 )   1. Patient will demonstrate/report pain at worst less than or equal to 2/10 to facilitate minimal limitation in daily activity secondary to pain symptoms. Goal status: on going - assessed 06/01/2021   2. Patient will demonstrate independent use of home exercise program to facilitate ability to maintain/progress functional gains from skilled physical therapy services. Goal status: on going - assessed 06/01/2021   3. Patient will demonstrate FOTO outcome > or = 62 % to indicate reduced disability due to condition. Goal status: on going - assessed 06/01/2021   4.  Patient will demonstrate Rt knee AROM 0-110 degrees to facilitate ability to perform transfers, sitting, ambulation, stair navigation s restriction due to mobility. Goal status: on going - assessed 06/01/2021   5.  Patient will demonstrate Rt LE MMT 5/5, dynamometry with 15 % of Lt knee extension to facilitate ability to perform usual standing, walking, stairs at PLOF s limitation due to symptoms.   Goal status: on going - assessed 06/01/2021   6.  Patient will demonstrate independent ambulation community distances >  300 ft to facilitate community integration at [facility].   Goal status: MET - assessed 06/01/2021   7.  Patient will demonstrate reciprocal gait pattern on stairs up/down s UE assist.  a.  Goal Status:on going - assessed 06/01/2021     PLAN: PT FREQUENCY: 2x/week   PT DURATION: 10 weeks   PLANNED INTERVENTIONS: Therapeutic exercises, Therapeutic activity, Neuro Muscular re-education, Balance training, Gait training, Patient/Family education, Joint mobilization, Stair training, DME instructions, Dry Needling,  Electrical stimulation, Cryotherapy, Moist heat, Taping, Ultrasound, Ionotophoresis 59m/ml Dexamethasone, and Manual therapy.  All included unless contraindicated   PLAN FOR NEXT SESSION:  Progressive strengthening, dynamic/compliant surface balance.  Eccentric step down control improvement.s    MScot Jun PT, DPT, OCS, ATC 06/14/21  3:00 PM

## 2021-06-15 ENCOUNTER — Encounter: Payer: Medicare Other | Admitting: Rehabilitative and Restorative Service Providers"

## 2021-06-20 ENCOUNTER — Encounter: Payer: Self-pay | Admitting: Rehabilitative and Restorative Service Providers"

## 2021-06-20 ENCOUNTER — Ambulatory Visit (INDEPENDENT_AMBULATORY_CARE_PROVIDER_SITE_OTHER): Payer: Medicare Other | Admitting: Rehabilitative and Restorative Service Providers"

## 2021-06-20 DIAGNOSIS — M25661 Stiffness of right knee, not elsewhere classified: Secondary | ICD-10-CM

## 2021-06-20 DIAGNOSIS — M25561 Pain in right knee: Secondary | ICD-10-CM | POA: Diagnosis not present

## 2021-06-20 DIAGNOSIS — R262 Difficulty in walking, not elsewhere classified: Secondary | ICD-10-CM

## 2021-06-20 DIAGNOSIS — R600 Localized edema: Secondary | ICD-10-CM

## 2021-06-20 DIAGNOSIS — M6281 Muscle weakness (generalized): Secondary | ICD-10-CM

## 2021-06-20 DIAGNOSIS — G8929 Other chronic pain: Secondary | ICD-10-CM

## 2021-06-20 NOTE — Therapy (Signed)
OUTPATIENT PHYSICAL THERAPY TREATMENT NOTE   Patient Name: Brandy Delgado MRN: [id number] DOB:16-May-1945, 76 y.o., female Today's Date: 06/20/2021  PCP: Patient, No Pcp Per (Inactive) REFERRING PROVIDER: [name]*  END OF SESSION:   PT End of Session - 06/20/21 1512     Visit Number 11    Number of Visits 20    Date for PT Re-Evaluation 07/12/21    Authorization Type Medicare, [employer] [place]    Progress Note Due on Visit 2    PT Start Time 1503    PT Stop Time 1554    PT Time Calculation (min) 51 min    Activity Tolerance Patient tolerated treatment well    Behavior During Therapy WFL for tasks assessed/performed               Past Medical History:  Diagnosis Date   Arthritis    Hypothyroidism    Skin cancer    Forehead   Thyroid disease    Unilateral primary osteoarthritis, right knee 04/06/2021   Past Surgical History:  Procedure Laterality Date   Arm surgery Right    CATARACT EXTRACTION W/ INTRAOCULAR LENS IMPLANT Bilateral    KNEE ARTHROSCOPY Bilateral    TOTAL KNEE ARTHROPLASTY Right 04/07/2021   Procedure: Right TOTAL KNEE ARTHROPLASTY;  Surgeon: [name], MD;  Location: [facility];  Service: Orthopedics;  Laterality: Right;   Patient Active Problem List   Diagnosis Date Noted   Status post total right knee replacement 04/07/2021   Unilateral primary osteoarthritis, right knee 04/06/2021   Impingement syndrome of right shoulder 09/25/2016   Impingement syndrome of left shoulder 12/12/2015    REFERRING DIAG: Z96.651 (ICD-10-CM) - Status post total right knee replacement  THERAPY DIAG:  Chronic pain of right knee  Muscle weakness (generalized)  Stiffness of right knee, not elsewhere classified  Difficulty in walking, not elsewhere classified  Localized edema  PERTINENT HISTORY: Hypothyroidism  PRECAUTIONS: None  SUBJECTIVE:  Pt indicated no new complaints today.   PAIN:  Are you having pain?  no NPRS scale: 0/10 upon  arrival  Pain location: Rt knee Pain description:  Aggravating factors: tightness c inactivity Relieving factors: repositioning, HEP     OBJECTIVE:    PATIENT SURVEYS:  05/30/2021:  FOTO update: 66%  05/03/2021: FOTO intake: 61  predicted:  62   Localized edema 05/03/2021: noted upon inspection for Rt knee joint.    LE ROM:   ROM P:passive  A:active  Right 05/03/21 Right 05/10/21 Right 05/19/2021 Rt 05/23/21 Right 06/01/2021  Hip flexion         Hip extension         Hip abduction         Hip adduction         Hip internal rotation         Hip external rotation         Knee flexion 101 AROM in supine heel slide  A: 102* supine AROM in supine heel slide: 105  AROM Supine Heel slide 112 AROM in supine heel slide 115  Knee extension -5 AROM in seated LAQ A: -5* Seated LAQ  AROM in seated LAQ: -4 AROM  Supine -2 AROM in seated LAQ -3  Ankle dorsiflexion         Ankle plantarflexion         Ankle inversion         Ankle eversion          (Blank rows = not  tested)   LE MMT:   MMT Right 05/03/2021 Left 05/03/2021 Right 05/19/2021 Right 06/01/2021 Right 06/14/2021  Hip flexion 4/5 5/5     Hip extension         Hip abduction         Hip adduction         Hip internal rotation         Hip external rotation         Knee flexion 5/5 5/5  5/5   Knee extension 4/5 35, 31.4 lbs   5/5 48.6, 43.3 lbs 33 lbs, 32 lbs 5/5 40.5, 39 lbs 5/5 42.3, 42.3 lbs  Ankle dorsiflexion 5/5 5/5     Ankle plantarflexion         Ankle inversion         Ankle eversion          (Blank rows = not tested)   FUNCTIONAL TESTS:  06/14/2021: Rt SLS: 4 seconds  06/01/2021:  18 inch chair transfer: able to perform s UE on 1st try c visible increased difficulty.     05/03/2021 18 inch chair transfer : unable to perform without UE Lt SLS: 3 seconds    Rt SLS: < 2 seconds c assistance required  GAIT: 05/30/2021: Able to ambulate independently in clinic.   05/03/2021 Distance walked: Household  distances within clinic < 150 ft c SPC Comments: Reduced stance on Rt LE, maintained knee flexion in stance, antalgic gait noted.        TODAY'S TREATMENT: 06/20/2021:  TherEx:  UBE LE only Lvl 3.0 full revolution 10 mins seat 12  Machine LAQ double leg up, Rt leg lowering 2x15 10 lbs (slot 3 from back) additional time spent due to slow rep speed focus  Incline board 30 x 3 gastroc stretch bilateral   Neuro Re-ed  SLS vector reaching fwd/lateral/back x 8 bilateral  SLS c anterior, anterior/medial, anterior/lateral cone touching x 8 bilateral c single hand on rail   TherActivity  4 inch step down c hip hike at top bilateral 2 x 15 (3 sec hold) bilateral UE light touch on bar  Rt leg press 31 lbs 2 x 15   Vasopneumatic: Supine 34 deg medium compression 10 mins Rt leg  06/14/2021:  TherEx:  UBE LE only Lvl 3.0 full revolution 8 mins seat 12  Machine LAQ double leg up, Rt leg lowering 3 x 10 10 lbs (slot 3 from back) additional time spent due to slow rep speed focus  Seated SLR slow focus x 15 Rt leg  2 inch step down c hip hike at top bilateral x 15 (3 sec hold) bilateral UE light touch on bar  Neuro re-ed  Tandem ambulation on foam in // bars fwd 8 ft x 10   Tandem stance on foam 1 min x 2 bilateral   Vasopneumatic: Supine 34 deg medium compression 10 mins Rt leg  06/01/2021:  TherEx:  UBE LE only Lvl 2.5 full revolution 10 mins seat 12  Machine LAQ double leg up, Rt leg lowering 3 x 10 5 lbs (slot 3 from back)  Sit to stand to sit 19 inch chair x 10  Step up on Rt leg 2 inch x 10  no UE assist  Supine bridge 5 sec hold x 15  Neuro re-ed  Tandem ambulation fwd on foam in // bars 8 ft x 10   Lateral side step on foam in // bars 8 ft x 5 each way  Vasopneumatic: Supine 34 deg medium compression 10 mins Rt leg   PATIENT EDUCATION:  05/03/2021 Education details: HEP, POC Person educated: Patient Education method: Consulting civil engineer, Demonstration, Verbal cues, and  Handouts Education comprehension: verbalized understanding, returned demonstration, and verbal cues required     HOME EXERCISE PROGRAM: 05/03/2021  Access Code: [email or web address] URL: [email or web address] Date: 05/03/2021 Prepared by: Scot Jun   Exercises - Supine Heel Slide  - 3-5 x daily - 7 x weekly - 1-2 sets - 10 reps - Supine Heel Slide with Strap (Mirrored)  - 3-5 x daily - 7 x weekly - 1-2 sets - 10 reps - 5 hold - Supine Knee Extension Mobilization with Weight  - 1 x daily - 7 x weekly - 1 sets - 4 reps - to tolerance up to 15 mins hold - Seated Quad Set  - 3-5 x daily - 7 x weekly - 1 sets - 10 reps - 5 hold - Seated Knee Flexion Extension AAROM with Overpressure  - 3-5 x daily - 7 x weekly - 1 sets - 10 reps - 5 hold - Seated Long Arc Quad  - 3-5 x daily - 7 x weekly - 1 sets - 10 reps - Seated Straight Leg Heel Taps  - 2-3 x daily - 7 x weekly - 1 sets - 10-15 reps   ASSESSMENT:   CLINICAL IMPRESSION: Improvement in eccentric WB control today c transition to 4 inch instead of 2 due to improvements.  Continued SLS and dynamic balance improvements to help continued progression.    OBJECTIVE IMPAIRMENTS Abnormal gait, decreased activity tolerance, decreased balance, decreased coordination, decreased endurance, decreased mobility, difficulty walking, decreased ROM, decreased strength, hypomobility, increased edema, impaired perceived functional ability, impaired flexibility, improper body mechanics, and pain.    ACTIVITY LIMITATIONS cleaning, community activity, driving, meal prep, occupation, and laundry.    PERSONAL FACTORS Hypothyroidismare also affecting patient's functional outcome.    REHAB POTENTIAL: Good   CLINICAL DECISION MAKING: Stable/uncomplicated   EVALUATION COMPLEXITY: Low     GOALS: Goals reviewed with patient? Yes   Short term PT Goals (target date for Short term goals are 3 weeks 05/24/2021) Patient will demonstrate independent use of  home exercise program to maintain progress from in clinic treatments. Goal status: MET - assessed 05/19/2021   Long term PT goals (target dates for all long term goals are 10 weeks  07/12/2021 )   1. Patient will demonstrate/report pain at worst less than or equal to 2/10 to facilitate minimal limitation in daily activity secondary to pain symptoms. Goal status: on going - assessed 06/01/2021   2. Patient will demonstrate independent use of home exercise program to facilitate ability to maintain/progress functional gains from skilled physical therapy services. Goal status: on going - assessed 06/01/2021   3. Patient will demonstrate FOTO outcome > or = 62 % to indicate reduced disability due to condition. Goal status: on going - assessed 06/01/2021   4.  Patient will demonstrate Rt knee AROM 0-110 degrees to facilitate ability to perform transfers, sitting, ambulation, stair navigation s restriction due to mobility. Goal status: on going - assessed 06/01/2021   5.  Patient will demonstrate Rt LE MMT 5/5, dynamometry with 15 % of Lt knee extension to facilitate ability to perform usual standing, walking, stairs at PLOF s limitation due to symptoms.   Goal status: on going - assessed 06/01/2021   6.  Patient will demonstrate independent ambulation community distances > 300 ft to facilitate community integration at [facility].  Goal status: MET - assessed 06/01/2021   7.  Patient will demonstrate reciprocal gait pattern on stairs up/down s UE assist.  a.  Goal Status:on going - assessed 06/01/2021     PLAN: PT FREQUENCY: 2x/week   PT DURATION: 10 weeks   PLANNED INTERVENTIONS: Therapeutic exercises, Therapeutic activity, Neuro Muscular re-education, Balance training, Gait training, Patient/Family education, Joint mobilization, Stair training, DME instructions, Dry Needling, Electrical stimulation, Cryotherapy, Moist heat, Taping, Ultrasound, Ionotophoresis 26m/ml Dexamethasone, and Manual therapy.  All  included unless contraindicated   PLAN FOR NEXT SESSION:  Continue eccentric strengthening for stair navigation.  LTG/FOTO reassessment.    MScot Jun PT, DPT, OCS, ATC 06/20/21  3:42 PM

## 2021-06-22 ENCOUNTER — Ambulatory Visit (INDEPENDENT_AMBULATORY_CARE_PROVIDER_SITE_OTHER): Payer: Medicare Other | Admitting: Rehabilitative and Restorative Service Providers"

## 2021-06-22 ENCOUNTER — Encounter: Payer: Self-pay | Admitting: Rehabilitative and Restorative Service Providers"

## 2021-06-22 DIAGNOSIS — M6281 Muscle weakness (generalized): Secondary | ICD-10-CM | POA: Diagnosis not present

## 2021-06-22 DIAGNOSIS — M25661 Stiffness of right knee, not elsewhere classified: Secondary | ICD-10-CM | POA: Diagnosis not present

## 2021-06-22 DIAGNOSIS — G8929 Other chronic pain: Secondary | ICD-10-CM

## 2021-06-22 DIAGNOSIS — M25561 Pain in right knee: Secondary | ICD-10-CM | POA: Diagnosis not present

## 2021-06-22 DIAGNOSIS — R262 Difficulty in walking, not elsewhere classified: Secondary | ICD-10-CM

## 2021-06-22 DIAGNOSIS — R600 Localized edema: Secondary | ICD-10-CM

## 2021-06-22 NOTE — Therapy (Addendum)
OUTPATIENT PHYSICAL THERAPY TREATMENT NOTE [employer]   Patient Name: Brandy Delgado MRN: [id number] DOB:09-02-1945, 76 y.o., female Today's Date: 06/22/2021  PCP: Patient, No Pcp Per (Inactive) REFERRING PROVIDER: [name]*  END OF SESSION:   PT End of Session - 06/22/21 1503     Visit Number 12    Number of Visits 20    Date for PT Re-Evaluation 07/12/21    Authorization Type Medicare, [place] [facility]    Progress Note Due on Visit 19    PT Start Time 1500    PT Stop Time 1550    PT Time Calculation (min) 50 min    Activity Tolerance Patient tolerated treatment well    Behavior During Therapy [facility] for tasks assessed/performed                Past Medical History:  Diagnosis Date   Arthritis    Hypothyroidism    Skin cancer    Forehead   Thyroid disease    Unilateral primary osteoarthritis, right knee 04/06/2021   Past Surgical History:  Procedure Laterality Date   Arm surgery Right    CATARACT EXTRACTION W/ INTRAOCULAR LENS IMPLANT Bilateral    KNEE ARTHROSCOPY Bilateral    TOTAL KNEE ARTHROPLASTY Right 04/07/2021   Procedure: Right TOTAL KNEE ARTHROPLASTY;  Surgeon: [name], MD;  Location: [facility];  Service: Orthopedics;  Laterality: Right;   Patient Active Problem List   Diagnosis Date Noted   Status post total right knee replacement 04/07/2021   Unilateral primary osteoarthritis, right knee 04/06/2021   Impingement syndrome of right shoulder 09/25/2016   Impingement syndrome of left shoulder 12/12/2015    REFERRING DIAG: Z96.651 (ICD-10-CM) - Status post total right knee replacement  THERAPY DIAG:  Chronic pain of right knee  Muscle weakness (generalized)  Stiffness of right knee, not elsewhere classified  Difficulty in walking, not elsewhere classified  Localized edema  PERTINENT HISTORY: Hypothyroidism  PRECAUTIONS: None  SUBJECTIVE:  Pt indicated no new complaints today.   PAIN:  Are you having pain?  no NPRS  scale: 0/10 upon arrival  Pain location: Rt knee Pain description:  Aggravating factors: tightness c inactivity Relieving factors: repositioning, HEP     OBJECTIVE:    PATIENT SURVEYS:  06/23/2022: update 75%  05/30/2021:  FOTO update: 66%  05/03/2021: FOTO intake: 61  predicted:  62   Localized edema 05/03/2021: noted upon inspection for Rt knee joint.    LE ROM:   ROM P:passive  A:active  Right 05/03/21 Right 05/10/21 Right 05/19/2021 Rt 05/23/21 Right 06/01/2021  Hip flexion         Hip extension         Hip abduction         Hip adduction         Hip internal rotation         Hip external rotation         Knee flexion 101 AROM in supine heel slide  A: 102* supine AROM in supine heel slide: 105  AROM Supine Heel slide 112 AROM in supine heel slide 115  Knee extension -5 AROM in seated LAQ A: -5* Seated LAQ  AROM in seated LAQ: -4 AROM  Supine -2 AROM in seated LAQ -3  Ankle dorsiflexion         Ankle plantarflexion         Ankle inversion         Ankle eversion          (  Blank rows = not tested)   LE MMT:   MMT Right 05/03/2021 Left 05/03/2021 Right 05/19/2021 Right 06/01/2021 Right 06/14/2021  Hip flexion 4/5 5/5     Hip extension         Hip abduction         Hip adduction         Hip internal rotation         Hip external rotation         Knee flexion 5/5 5/5  5/5   Knee extension 4/5 35, 31.4 lbs   5/5 48.6, 43.3 lbs 33 lbs, 32 lbs 5/5 40.5, 39 lbs 5/5 42.3, 42.3 lbs  Ankle dorsiflexion 5/5 5/5     Ankle plantarflexion         Ankle inversion         Ankle eversion          (Blank rows = not tested)   FUNCTIONAL TESTS:  06/14/2021: Rt SLS: 4 seconds  06/01/2021:  18 inch chair transfer: able to perform s UE on 1st try c visible increased difficulty.     05/03/2021 18 inch chair transfer : unable to perform without UE Lt SLS: 3 seconds    Rt SLS: < 2 seconds c assistance required  GAIT: 05/30/2021: Able to ambulate independently in clinic.    05/03/2021 Distance walked: Household distances within clinic < 150 ft c SPC Comments: Reduced stance on Rt LE, maintained knee flexion in stance, antalgic gait noted.        TODAY'S TREATMENT: 06/22/2021:  TherEx:  UBE LE only Lvl 3.0 full revolution 10 mins seat 12  Machine LAQ double leg up, Rt leg lowering 3x15 10 lbs (slot 3 from back) additional time spent due to slow rep speed focus  Incline board 30 x 3 gastroc stretch bilateral  Seated SLR Rt 2 x 10 1.5 lb   TherActivity  6 inch step up Rt leg 2 x 10 single hand rail  4 inch step up on and over WB Rt leg x 15 single hand rail  Rt leg press 37 lbs 3 x 10   Vasopneumatic: Supine 34 deg medium compression 10 mins Rt leg  06/20/2021:  TherEx:  UBE LE only Lvl 3.0 full revolution 10 mins seat 12  Machine LAQ double leg up, Rt leg lowering 2x15 10 lbs (slot 3 from back) additional time spent due to slow rep speed focus  Incline board 30 x 3 gastroc stretch bilateral   Neuro Re-ed  SLS vector reaching fwd/lateral/back x 8 bilateral  SLS c anterior, anterior/medial, anterior/lateral cone touching x 8 bilateral c single hand on rail   TherActivity  4 inch step down c hip hike at top bilateral 2 x 15 (3 sec hold) bilateral UE light touch on bar  Rt leg press 31 lbs 2 x 15   Vasopneumatic: Supine 34 deg medium compression 10 mins Rt leg  06/14/2021:  TherEx:  UBE LE only Lvl 3.0 full revolution 8 mins seat 12  Machine LAQ double leg up, Rt leg lowering 3 x 10 10 lbs (slot 3 from back) additional time spent due to slow rep speed focus  Seated SLR slow focus x 15 Rt leg  2 inch step down c hip hike at top bilateral x 15 (3 sec hold) bilateral UE light touch on bar  Neuro re-ed  Tandem ambulation on foam in // bars fwd 8 ft x 10   Tandem stance on foam 1 min  x 2 bilateral   Vasopneumatic: Supine 34 deg medium compression 10 mins Rt leg   PATIENT EDUCATION:  05/03/2021 Education details: HEP, POC Person educated:  Patient Education method: Consulting civil engineer, Demonstration, Verbal cues, and Handouts Education comprehension: verbalized understanding, returned demonstration, and verbal cues required     HOME EXERCISE PROGRAM: 05/03/2021  Access Code: [email or web address] URL: [email or web address] Date: 05/03/2021 Prepared by: Scot Jun   Exercises - Supine Heel Slide  - 3-5 x daily - 7 x weekly - 1-2 sets - 10 reps - Supine Heel Slide with Strap (Mirrored)  - 3-5 x daily - 7 x weekly - 1-2 sets - 10 reps - 5 hold - Supine Knee Extension Mobilization with Weight  - 1 x daily - 7 x weekly - 1 sets - 4 reps - to tolerance up to 15 mins hold - Seated Quad Set  - 3-5 x daily - 7 x weekly - 1 sets - 10 reps - 5 hold - Seated Knee Flexion Extension AAROM with Overpressure  - 3-5 x daily - 7 x weekly - 1 sets - 10 reps - 5 hold - Seated Long Arc Quad  - 3-5 x daily - 7 x weekly - 1 sets - 10 reps - Seated Straight Leg Heel Taps  - 2-3 x daily - 7 x weekly - 1 sets - 10-15 reps   ASSESSMENT:   CLINICAL IMPRESSION: Eccentric lowering forward on higher step height still shows deficits in control from Rt leg at this time.  Continued strengthening to benefit.    OBJECTIVE IMPAIRMENTS Abnormal gait, decreased activity tolerance, decreased balance, decreased coordination, decreased endurance, decreased mobility, difficulty walking, decreased ROM, decreased strength, hypomobility, increased edema, impaired perceived functional ability, impaired flexibility, improper body mechanics, and pain.    ACTIVITY LIMITATIONS cleaning, community activity, driving, meal prep, occupation, and laundry.    PERSONAL FACTORS Hypothyroidismare also affecting patient's functional outcome.    REHAB POTENTIAL: Good   CLINICAL DECISION MAKING: Stable/uncomplicated   EVALUATION COMPLEXITY: Low     GOALS: Goals reviewed with patient? Yes   Short term PT Goals (target date for Short term goals are 3 weeks 05/24/2021) Patient  will demonstrate independent use of home exercise program to maintain progress from in clinic treatments. Goal status: MET - assessed 05/19/2021   Long term PT goals (target dates for all long term goals are 10 weeks  07/12/2021 )   1. Patient will demonstrate/report pain at worst less than or equal to 2/10 to facilitate minimal limitation in daily activity secondary to pain symptoms. Goal status: on going - assessed 06/01/2021   2. Patient will demonstrate independent use of home exercise program to facilitate ability to maintain/progress functional gains from skilled physical therapy services. Goal status: on going - assessed 06/01/2021   3. Patient will demonstrate FOTO outcome > or = 62 % to indicate reduced disability due to condition. Goal status: on going - assessed 06/01/2021   4.  Patient will demonstrate Rt knee AROM 0-110 degrees to facilitate ability to perform transfers, sitting, ambulation, stair navigation s restriction due to mobility. Goal status: on going - assessed 06/01/2021   5.  Patient will demonstrate Rt LE MMT 5/5, dynamometry with 15 % of Lt knee extension to facilitate ability to perform usual standing, walking, stairs at PLOF s limitation due to symptoms.   Goal status: on going - assessed 06/01/2021   6.  Patient will demonstrate independent ambulation community distances > 300 ft to facilitate community integration at [facility].  Goal status: MET - assessed 06/01/2021   7.  Patient will demonstrate reciprocal gait pattern on stairs up/down s UE assist.  a.  Goal Status:on going - assessed 06/01/2021     PLAN: PT FREQUENCY: 2x/week   PT DURATION: 10 weeks   PLANNED INTERVENTIONS: Therapeutic exercises, Therapeutic activity, Neuro Muscular re-education, Balance training, Gait training, Patient/Family education, Joint mobilization, Stair training, DME instructions, Dry Needling, Electrical stimulation, Cryotherapy, Moist heat, Taping, Ultrasound, Ionotophoresis 6m/ml  Dexamethasone, and Manual therapy.  All included unless contraindicated   PLAN FOR NEXT SESSION:  Anticipate discharge.  Check ROM/strength/goals.    MScot Jun PT, DPT, OCS, ATC 06/22/21  3:37 PM   PHYSICAL THERAPY DISCHARGE SUMMARY  Visits from Start of Care: 12  Current functional level related to goals / functional outcomes: See note   Remaining deficits: See note   Education / Equipment: HEP   Patient agrees to discharge. Patient goals were  mostly met . Patient is being discharged due to being pleased with the current functional level.  MScot Jun PT, DPT, OCS, ATC 07/27/21  11:00 AM

## 2021-06-27 ENCOUNTER — Encounter: Payer: Medicare Other | Admitting: Rehabilitative and Restorative Service Providers"

## 2021-06-29 ENCOUNTER — Encounter: Payer: Medicare Other | Admitting: Rehabilitative and Restorative Service Providers"

## 2021-07-03 ENCOUNTER — Encounter: Payer: Medicare Other | Admitting: Rehabilitative and Restorative Service Providers"

## 2021-07-03 ENCOUNTER — Telehealth: Payer: Self-pay | Admitting: Orthopaedic Surgery

## 2021-07-03 MED ORDER — CLINDAMYCIN HCL 150 MG PO CAPS
150.0000 mg | ORAL_CAPSULE | ORAL | 0 refills | Status: AC
Start: 2021-07-03 — End: ?

## 2021-07-03 NOTE — Telephone Encounter (Signed)
She will be 3 months on Friday. Does she still need this?

## 2021-07-03 NOTE — Telephone Encounter (Signed)
Pt called requesting pre meds for her upcoming appt. Please send to [facility] on [address]. Please call pt at [phone].

## 2021-07-03 NOTE — Telephone Encounter (Signed)
Sent to pharmacy, pt informed

## 2021-07-04 ENCOUNTER — Telehealth: Payer: Self-pay | Admitting: Orthopaedic Surgery

## 2021-07-04 NOTE — Telephone Encounter (Signed)
Letter completed and faxed.

## 2021-07-04 NOTE — Telephone Encounter (Signed)
Received vm from [facility] @ Dr. Irineo Axon office (dental office). They received clearance- no meds needed, but patient told them she does and meds was sent in. Please call to clarify if needs pre-meds or not. [phone]

## 2021-07-04 NOTE — Telephone Encounter (Signed)
Called and advised office that she is still within her 3 months and needs antibiotic. She stated understanding and needs that in witting faxed to [phone]

## 2021-12-06 ENCOUNTER — Ambulatory Visit: Payer: Medicare Other | Admitting: Orthopaedic Surgery

## 2021-12-11 ENCOUNTER — Ambulatory Visit (INDEPENDENT_AMBULATORY_CARE_PROVIDER_SITE_OTHER): Payer: Medicare Other | Admitting: Orthopaedic Surgery

## 2021-12-11 ENCOUNTER — Ambulatory Visit (INDEPENDENT_AMBULATORY_CARE_PROVIDER_SITE_OTHER): Payer: Medicare Other

## 2021-12-11 ENCOUNTER — Encounter: Payer: Self-pay | Admitting: Orthopaedic Surgery

## 2021-12-11 DIAGNOSIS — Z96651 Presence of right artificial knee joint: Secondary | ICD-10-CM | POA: Diagnosis not present

## 2021-12-11 NOTE — Progress Notes (Signed)
The patient is a pleasant and active 76 year old female who is about 8 months status post a right total knee arthroplasty.  She says she has good range of motion and strength.  She says it feels much better than it did before surgery and she is not needing an assistive device to mobilize or ambulate.  Her extension is full and her flexion is full of her right knee and feels ligamentously stable.  It is not significantly warm and her incision is healed.  Standing AP and lateral of the right knee does show significant arthritis of the left knee especially with varus malalignment and the medial compartment narrowing.  The right total knee arthroplasty is in good position.  There is cortical irregularities at the inferior pole the patella but has been present on previous films and extensor mechanism is intact.  There is no evidence of loosening of the prosthesis.  She will continue her activities as she tolerates.  If her left knee starts to bother her or her right knee has any issues she knows to come back and see [place].  All questions and concerns were answered and addressed.
